# Patient Record
Sex: Female | Born: 1935 | Race: White | Hispanic: No | Marital: Married | Smoking: Never smoker
Health system: Southern US, Community
[De-identification: ages and names within clinical notes are randomized; demographics above are authoritative.]

## PROBLEM LIST (undated history)

## (undated) DIAGNOSIS — T884XXA Failed or difficult intubation, initial encounter: Secondary | ICD-10-CM

## (undated) DIAGNOSIS — T8859XA Other complications of anesthesia, initial encounter: Secondary | ICD-10-CM

## (undated) DIAGNOSIS — M1990 Unspecified osteoarthritis, unspecified site: Secondary | ICD-10-CM

## (undated) DIAGNOSIS — E119 Type 2 diabetes mellitus without complications: Secondary | ICD-10-CM

## (undated) DIAGNOSIS — T4145XA Adverse effect of unspecified anesthetic, initial encounter: Secondary | ICD-10-CM

## (undated) DIAGNOSIS — E079 Disorder of thyroid, unspecified: Secondary | ICD-10-CM

## (undated) DIAGNOSIS — N390 Urinary tract infection, site not specified: Secondary | ICD-10-CM

## (undated) HISTORY — PX: ACHILLES TENDON SURGERY: SHX542

## (undated) HISTORY — PX: OTHER SURGICAL HISTORY: SHX169

## (undated) HISTORY — PX: SHOULDER SURGERY: SHX246

## (undated) HISTORY — PX: FOOT SURGERY: SHX648

## (undated) HISTORY — PX: HEMORROIDECTOMY: SUR656

## (undated) HISTORY — PX: ABDOMINAL HYSTERECTOMY: SHX81

## (undated) HISTORY — PX: MEDIAL PARTIAL KNEE REPLACEMENT: SHX5965

## (undated) HISTORY — PX: THYROIDECTOMY, PARTIAL: SHX18

---

## 2010-01-17 ENCOUNTER — Emergency Department (HOSPITAL_BASED_OUTPATIENT_CLINIC_OR_DEPARTMENT_OTHER): Admission: EM | Admit: 2010-01-17 | Discharge: 2010-01-17 | Payer: Self-pay | Admitting: Emergency Medicine

## 2010-01-17 ENCOUNTER — Ambulatory Visit: Payer: Self-pay | Admitting: Diagnostic Radiology

## 2010-01-24 ENCOUNTER — Emergency Department (HOSPITAL_BASED_OUTPATIENT_CLINIC_OR_DEPARTMENT_OTHER): Admission: EM | Admit: 2010-01-24 | Discharge: 2010-01-24 | Payer: Self-pay | Admitting: Emergency Medicine

## 2010-01-28 ENCOUNTER — Ambulatory Visit: Payer: Self-pay | Admitting: Radiology

## 2010-01-28 ENCOUNTER — Emergency Department (HOSPITAL_BASED_OUTPATIENT_CLINIC_OR_DEPARTMENT_OTHER): Admission: EM | Admit: 2010-01-28 | Discharge: 2010-01-28 | Payer: Self-pay | Admitting: Emergency Medicine

## 2010-11-24 LAB — URINALYSIS, ROUTINE W REFLEX MICROSCOPIC
Bilirubin Urine: NEGATIVE
Glucose, UA: NEGATIVE mg/dL
Hgb urine dipstick: NEGATIVE
Ketones, ur: NEGATIVE mg/dL
Nitrite: NEGATIVE
Nitrite: NEGATIVE
Protein, ur: NEGATIVE mg/dL
Protein, ur: NEGATIVE mg/dL
Specific Gravity, Urine: 1.017 (ref 1.005–1.030)
Urobilinogen, UA: 0.2 mg/dL (ref 0.0–1.0)
Urobilinogen, UA: 0.2 mg/dL (ref 0.0–1.0)
pH: 5 (ref 5.0–8.0)

## 2010-11-24 LAB — COMPREHENSIVE METABOLIC PANEL
ALT: 60 U/L — ABNORMAL HIGH (ref 0–35)
AST: 110 U/L — ABNORMAL HIGH (ref 0–37)
AST: 78 U/L — ABNORMAL HIGH (ref 0–37)
Alkaline Phosphatase: 139 U/L — ABNORMAL HIGH (ref 39–117)
BUN: 42 mg/dL — ABNORMAL HIGH (ref 6–23)
Calcium: 9.2 mg/dL (ref 8.4–10.5)
Calcium: 9.3 mg/dL (ref 8.4–10.5)
Creatinine, Ser: 1.2 mg/dL (ref 0.4–1.2)
Glucose, Bld: 124 mg/dL — ABNORMAL HIGH (ref 70–99)
Glucose, Bld: 86 mg/dL (ref 70–99)
Potassium: 4.8 mEq/L (ref 3.5–5.1)
Sodium: 144 mEq/L (ref 135–145)
Total Bilirubin: 0.5 mg/dL (ref 0.3–1.2)
Total Protein: 7.4 g/dL (ref 6.0–8.3)
Total Protein: 7.7 g/dL (ref 6.0–8.3)

## 2010-11-24 LAB — CBC
HCT: 39.3 % (ref 36.0–46.0)
HCT: 40.1 % (ref 36.0–46.0)
MCV: 91.8 fL (ref 78.0–100.0)
MCV: 92 fL (ref 78.0–100.0)
Platelets: 130 10*3/uL — ABNORMAL LOW (ref 150–400)
Platelets: 169 10*3/uL (ref 150–400)
RDW: 14.7 % (ref 11.5–15.5)

## 2010-11-24 LAB — DIFFERENTIAL
Basophils Absolute: 0.1 10*3/uL (ref 0.0–0.1)
Basophils Relative: 1 % (ref 0–1)
Basophils Relative: 1 % (ref 0–1)
Lymphocytes Relative: 15 % (ref 12–46)
Lymphocytes Relative: 27 % (ref 12–46)
Lymphs Abs: 0.7 10*3/uL (ref 0.7–4.0)
Lymphs Abs: 1.4 10*3/uL (ref 0.7–4.0)
Neutrophils Relative %: 53 % (ref 43–77)

## 2010-11-24 LAB — URINE MICROSCOPIC-ADD ON

## 2010-11-24 LAB — LIPASE, BLOOD: Lipase: 89 U/L (ref 23–300)

## 2010-11-25 LAB — URINALYSIS, ROUTINE W REFLEX MICROSCOPIC
Bilirubin Urine: NEGATIVE
Nitrite: NEGATIVE
Specific Gravity, Urine: 1.012 (ref 1.005–1.030)
pH: 6 (ref 5.0–8.0)

## 2010-11-25 LAB — BASIC METABOLIC PANEL
BUN: 38 mg/dL — ABNORMAL HIGH (ref 6–23)
Calcium: 9.7 mg/dL (ref 8.4–10.5)
Chloride: 104 mEq/L (ref 96–112)
Potassium: 5.2 mEq/L — ABNORMAL HIGH (ref 3.5–5.1)

## 2010-11-25 LAB — DIFFERENTIAL
Basophils Absolute: 0.1 10*3/uL (ref 0.0–0.1)
Basophils Relative: 1 % (ref 0–1)
Eosinophils Absolute: 0 10*3/uL (ref 0.0–0.7)
Eosinophils Relative: 0 % (ref 0–5)
Lymphocytes Relative: 15 % (ref 12–46)
Monocytes Absolute: 0.9 10*3/uL (ref 0.1–1.0)
Neutrophils Relative %: 76 % (ref 43–77)

## 2010-11-25 LAB — CBC
HCT: 38.5 % (ref 36.0–46.0)
Hemoglobin: 12.8 g/dL (ref 12.0–15.0)
Platelets: 294 10*3/uL (ref 150–400)
RBC: 4.19 MIL/uL (ref 3.87–5.11)
RDW: 15.2 % (ref 11.5–15.5)
WBC: 11.3 10*3/uL — ABNORMAL HIGH (ref 4.0–10.5)

## 2013-09-12 ENCOUNTER — Encounter (HOSPITAL_COMMUNITY): Payer: Self-pay | Admitting: Emergency Medicine

## 2013-09-12 ENCOUNTER — Inpatient Hospital Stay (HOSPITAL_COMMUNITY)
Admission: EM | Admit: 2013-09-12 | Discharge: 2013-09-13 | DRG: 812 | Disposition: A | Discharge status: Nursing Home (Includes ICF) | Payer: Medicare Other | Attending: Family Medicine | Admitting: Family Medicine

## 2013-09-12 DIAGNOSIS — F3289 Other specified depressive episodes: Secondary | ICD-10-CM | POA: Diagnosis present

## 2013-09-12 DIAGNOSIS — K219 Gastro-esophageal reflux disease without esophagitis: Secondary | ICD-10-CM

## 2013-09-12 DIAGNOSIS — I10 Essential (primary) hypertension: Secondary | ICD-10-CM | POA: Diagnosis present

## 2013-09-12 DIAGNOSIS — D509 Iron deficiency anemia, unspecified: Principal | ICD-10-CM | POA: Diagnosis present

## 2013-09-12 DIAGNOSIS — R5381 Other malaise: Secondary | ICD-10-CM

## 2013-09-12 DIAGNOSIS — E119 Type 2 diabetes mellitus without complications: Secondary | ICD-10-CM | POA: Diagnosis present

## 2013-09-12 DIAGNOSIS — R5383 Other fatigue: Secondary | ICD-10-CM

## 2013-09-12 DIAGNOSIS — E039 Hypothyroidism, unspecified: Secondary | ICD-10-CM | POA: Diagnosis present

## 2013-09-12 DIAGNOSIS — Z96659 Presence of unspecified artificial knee joint: Secondary | ICD-10-CM

## 2013-09-12 DIAGNOSIS — M109 Gout, unspecified: Secondary | ICD-10-CM | POA: Diagnosis present

## 2013-09-12 DIAGNOSIS — M129 Arthropathy, unspecified: Secondary | ICD-10-CM | POA: Diagnosis present

## 2013-09-12 DIAGNOSIS — Z888 Allergy status to other drugs, medicaments and biological substances status: Secondary | ICD-10-CM

## 2013-09-12 DIAGNOSIS — Z9109 Other allergy status, other than to drugs and biological substances: Secondary | ICD-10-CM

## 2013-09-12 DIAGNOSIS — Z66 Do not resuscitate: Secondary | ICD-10-CM | POA: Diagnosis present

## 2013-09-12 DIAGNOSIS — D649 Anemia, unspecified: Secondary | ICD-10-CM | POA: Diagnosis present

## 2013-09-12 DIAGNOSIS — F329 Major depressive disorder, single episode, unspecified: Secondary | ICD-10-CM

## 2013-09-12 DIAGNOSIS — Z794 Long term (current) use of insulin: Secondary | ICD-10-CM

## 2013-09-12 DIAGNOSIS — Z79899 Other long term (current) drug therapy: Secondary | ICD-10-CM

## 2013-09-12 DIAGNOSIS — Z880 Allergy status to penicillin: Secondary | ICD-10-CM

## 2013-09-12 DIAGNOSIS — F32A Depression, unspecified: Secondary | ICD-10-CM

## 2013-09-12 DIAGNOSIS — R531 Weakness: Secondary | ICD-10-CM

## 2013-09-12 HISTORY — DX: Other complications of anesthesia, initial encounter: T88.59XA

## 2013-09-12 HISTORY — DX: Disorder of thyroid, unspecified: E07.9

## 2013-09-12 HISTORY — DX: Adverse effect of unspecified anesthetic, initial encounter: T41.45XA

## 2013-09-12 HISTORY — DX: Type 2 diabetes mellitus without complications: E11.9

## 2013-09-12 HISTORY — DX: Failed or difficult intubation, initial encounter: T88.4XXA

## 2013-09-12 HISTORY — DX: Unspecified osteoarthritis, unspecified site: M19.90

## 2013-09-12 LAB — CBC WITH DIFFERENTIAL/PLATELET
BASOS ABS: 0 10*3/uL (ref 0.0–0.1)
BASOS PCT: 0 % (ref 0–1)
EOS PCT: 4 % (ref 0–5)
Eosinophils Absolute: 0.2 10*3/uL (ref 0.0–0.7)
HEMATOCRIT: 15.8 % — AB (ref 36.0–46.0)
Hemoglobin: 4.8 g/dL — CL (ref 12.0–15.0)
LYMPHS PCT: 28 % (ref 12–46)
Lymphs Abs: 1.6 10*3/uL (ref 0.7–4.0)
MCH: 32 pg (ref 26.0–34.0)
MCHC: 30.4 g/dL (ref 30.0–36.0)
MCV: 105.3 fL — AB (ref 78.0–100.0)
Monocytes Absolute: 0.6 10*3/uL (ref 0.1–1.0)
Monocytes Relative: 11 % (ref 3–12)
Neutro Abs: 3.3 10*3/uL (ref 1.7–7.7)
Neutrophils Relative %: 57 % (ref 43–77)
Platelets: 225 10*3/uL (ref 150–400)
RBC: 1.5 MIL/uL — ABNORMAL LOW (ref 3.87–5.11)
RDW: 19.8 % — ABNORMAL HIGH (ref 11.5–15.5)
WBC: 5.8 10*3/uL (ref 4.0–10.5)

## 2013-09-12 LAB — GLUCOSE, CAPILLARY: Glucose-Capillary: 99 mg/dL (ref 70–99)

## 2013-09-12 LAB — POCT I-STAT, CHEM 8
BUN: 19 mg/dL (ref 6–23)
Calcium, Ion: 1.16 mmol/L (ref 1.13–1.30)
Chloride: 102 mEq/L (ref 96–112)
Creatinine, Ser: 1.1 mg/dL (ref 0.50–1.10)
Glucose, Bld: 140 mg/dL — ABNORMAL HIGH (ref 70–99)
HCT: 17 % — ABNORMAL LOW (ref 36.0–46.0)
HEMOGLOBIN: 5.8 g/dL — AB (ref 12.0–15.0)
POTASSIUM: 4.1 meq/L (ref 3.7–5.3)
SODIUM: 140 meq/L (ref 137–147)
TCO2: 25 mmol/L (ref 0–100)

## 2013-09-12 LAB — COMPREHENSIVE METABOLIC PANEL
ALT: 20 U/L (ref 0–35)
AST: 28 U/L (ref 0–37)
Albumin: 2.5 g/dL — ABNORMAL LOW (ref 3.5–5.2)
Alkaline Phosphatase: 82 U/L (ref 39–117)
BUN: 19 mg/dL (ref 6–23)
CO2: 25 mEq/L (ref 19–32)
CREATININE: 0.95 mg/dL (ref 0.50–1.10)
Calcium: 8.4 mg/dL (ref 8.4–10.5)
Chloride: 102 mEq/L (ref 96–112)
GFR calc non Af Amer: 53 mL/min — ABNORMAL LOW (ref >90)
GFR, EST AFRICAN AMERICAN: 61 mL/min — AB (ref >90)
Glucose, Bld: 143 mg/dL — ABNORMAL HIGH (ref 70–99)
Potassium: 4.2 mEq/L (ref 3.7–5.3)
SODIUM: 141 meq/L (ref 137–147)
TOTAL PROTEIN: 5.4 g/dL — AB (ref 6.0–8.3)
Total Bilirubin: <0.2 mg/dL — ABNORMAL LOW (ref 0.3–1.2)

## 2013-09-12 LAB — RETICULOCYTES
RBC.: 1.48 MIL/uL — ABNORMAL LOW (ref 3.87–5.11)
RETIC CT PCT: 13.7 % — AB (ref 0.4–3.1)
Retic Count, Absolute: 202.8 10*3/uL — ABNORMAL HIGH (ref 19.0–186.0)

## 2013-09-12 LAB — POCT I-STAT TROPONIN I: Troponin i, poc: 0.02 ng/mL (ref 0.00–0.08)

## 2013-09-12 LAB — PREPARE RBC (CROSSMATCH)

## 2013-09-12 LAB — PROTIME-INR
INR: 0.89 (ref 0.00–1.49)
PROTHROMBIN TIME: 11.9 s (ref 11.6–15.2)

## 2013-09-12 LAB — OCCULT BLOOD, POC DEVICE: Fecal Occult Bld: NEGATIVE

## 2013-09-12 LAB — ABO/RH: ABO/RH(D): O POS

## 2013-09-12 MED ORDER — ALLOPURINOL 100 MG PO TABS
100.0000 mg | ORAL_TABLET | Freq: Two times a day (BID) | ORAL | Status: DC
  Administered 2013-09-12 – 2013-09-13 (×2): 100 mg via ORAL
  Filled 2013-09-12 (×3): qty 1

## 2013-09-12 MED ORDER — SODIUM CHLORIDE 0.9 % IJ SOLN: 3.0000 mL | Freq: Two times a day (BID) | INTRAMUSCULAR | Status: DC

## 2013-09-12 MED ORDER — DIVALPROEX SODIUM ER 500 MG PO TB24
500.0000 mg | ORAL_TABLET | Freq: Every day | ORAL | Status: DC
  Administered 2013-09-12: 22:00:00 500 mg via ORAL
  Filled 2013-09-12 (×2): qty 1

## 2013-09-12 MED ORDER — LATANOPROST 0.005 % OP SOLN
1.0000 [drp] | Freq: Every day | OPHTHALMIC | Status: DC
  Administered 2013-09-12: 22:00:00 1 [drp] via OPHTHALMIC
  Filled 2013-09-12: qty 2.5

## 2013-09-12 MED ORDER — PANTOPRAZOLE SODIUM 40 MG PO TBEC
40.0000 mg | DELAYED_RELEASE_TABLET | Freq: Every day | ORAL | Status: DC
  Administered 2013-09-13: 40 mg via ORAL
  Filled 2013-09-12: qty 1

## 2013-09-12 MED ORDER — MECLIZINE HCL 25 MG PO TABS
25.0000 mg | ORAL_TABLET | Freq: Every day | ORAL | Status: DC
  Administered 2013-09-13: 25 mg via ORAL
  Filled 2013-09-12: qty 1

## 2013-09-12 MED ORDER — FUROSEMIDE 10 MG/ML IJ SOLN
20.0000 mg | Freq: Once | INTRAMUSCULAR | Status: AC
  Administered 2013-09-12: 20 mg via INTRAVENOUS
  Filled 2013-09-12: qty 2

## 2013-09-12 MED ORDER — SODIUM CHLORIDE 0.9 % IJ SOLN: 3.0000 mL | INTRAMUSCULAR | Status: DC | PRN

## 2013-09-12 MED ORDER — ONDANSETRON HCL 4 MG/2ML IJ SOLN: 4.0000 mg | Freq: Three times a day (TID) | INTRAMUSCULAR | Status: AC | PRN

## 2013-09-12 MED ORDER — HYDROCORTISONE ACETATE 25 MG RE SUPP
25.0000 mg | Freq: Two times a day (BID) | RECTAL | Status: DC
  Administered 2013-09-12: 22:00:00 25 mg via RECTAL
  Filled 2013-09-12 (×3): qty 1

## 2013-09-12 MED ORDER — FERROUS SULFATE 325 (65 FE) MG PO TABS
325.0000 mg | ORAL_TABLET | Freq: Three times a day (TID) | ORAL | Status: DC
  Administered 2013-09-12 – 2013-09-13 (×3): 325 mg via ORAL
  Filled 2013-09-12 (×5): qty 1

## 2013-09-12 MED ORDER — INSULIN ASPART 100 UNIT/ML ~~LOC~~ SOLN: 0.0000 [IU] | Freq: Three times a day (TID) | SUBCUTANEOUS | Status: DC

## 2013-09-12 MED ORDER — SODIUM CHLORIDE 0.9 % IV BOLUS (SEPSIS)
1000.0000 mL | Freq: Once | INTRAVENOUS | Status: AC
  Administered 2013-09-12: 1000 mL via INTRAVENOUS

## 2013-09-12 MED ORDER — LEVOTHYROXINE SODIUM 75 MCG PO TABS
75.0000 ug | ORAL_TABLET | Freq: Every day | ORAL | Status: DC
  Administered 2013-09-13: 75 ug via ORAL
  Filled 2013-09-12 (×2): qty 1

## 2013-09-12 MED ORDER — VENLAFAXINE HCL ER 150 MG PO CP24
150.0000 mg | ORAL_CAPSULE | Freq: Every day | ORAL | Status: DC
  Administered 2013-09-13: 09:00:00 150 mg via ORAL
  Filled 2013-09-12: qty 1

## 2013-09-12 MED ORDER — SODIUM CHLORIDE 0.9 % IV SOLN
INTRAVENOUS | Status: AC
Start: 2013-09-12 — End: 2013-09-13
  Administered 2013-09-12: 23:00:00 via INTRAVENOUS

## 2013-09-12 MED ORDER — OXYCODONE-ACETAMINOPHEN 5-325 MG PO TABS
1.0000 | ORAL_TABLET | Freq: Two times a day (BID) | ORAL | Status: DC | PRN
  Administered 2013-09-12 – 2013-09-13 (×2): 1 via ORAL
  Filled 2013-09-12 (×2): qty 1

## 2013-09-12 MED ORDER — SODIUM CHLORIDE 0.9 % IJ SOLN
3.0000 mL | Freq: Two times a day (BID) | INTRAMUSCULAR | Status: DC
  Administered 2013-09-12 – 2013-09-13 (×2): 3 mL via INTRAVENOUS

## 2013-09-12 MED ORDER — SODIUM CHLORIDE 0.9 % IV SOLN: 250.0000 mL | INTRAVENOUS | Status: DC | PRN

## 2013-09-12 NOTE — ED Notes (Addendum)
Per EMS, [facility], c/o weakness, headache, and abnormal lab value x "a couple days."  Facility sts Hgb 4.7.  Vitals are stable.  Pt reports rectal bleeding, but sts "it has pretty much stopped."

## 2013-09-12 NOTE — ED Provider Notes (Signed)
CSN: [id number]     Arrival date & time 09/12/13  1430 History   First MD Initiated Contact with Patient 09/12/13 1500     Chief Complaint  Patient presents with  . Weakness  . Abnormal Lab value    (Consider location/radiation/quality/duration/timing/severity/associated sxs/prior Treatment) HPI  80 year old female BIB EMS from [facility] nursing facility for evaluation of generalized weakness and anemia.  History obtained through pt who is a good historian. Pt sts she felt tired/fatigue ongoing for nearly a week.  Onset is gradual, report lightheadedness, especially with exertion and with movement.  Feels like she has to sleep more than usual.  She also report mild frontal headache, body aches and leg pain.  Pain to leg is achy R>L.  Her facility noted that her Hgb is 4.7 and sent her here for evaluation.  Husband noted that pt has anemia and required 1 pint of blood transfusion within the past 6 months, unsure cause of anemia.  Otherwise pt denies fever, chills, cp, sob, productive cough, hemoptysis, hematemesis, hematochezia or melena. Denies abd pain, back pain, dysuria or rash.  Does notice some bright blood when wipes after a BM several days ago but that has stopped.  sts she has hx of hemorrhoids and that's not unusual.  Denies any NSAIDs use, denies taking blood thinner medication. No other complaint.    No past medical history on file. No past surgical history on file. No family history on file. History  Substance Use Topics  . Smoking status: Not on file  . Smokeless tobacco: Not on file  . Alcohol Use: Not on file   OB History   No data available     Review of Systems  All other systems reviewed and are negative.    Allergies  Gabapentin; Lorazepam; Metoclopramide; Penicillins; Tape; and Teflaro  Home Medications   Current Outpatient Rx  Name  Route  Sig  Dispense  Refill  . allopurinol (ZYLOPRIM) 100 MG tablet   Oral   Take 100 mg by mouth 2 (two) times  daily.         . baclofen (LIORESAL) 10 MG tablet   Oral   Take 10 mg by mouth at bedtime.         . beta carotene w/minerals (OCUVITE) tablet   Oral   Take 1 tablet by mouth 2 (two) times daily.         . calcium-vitamin D (OSCAL WITH D) 500-200 MG-UNIT per tablet   Oral   Take 1 tablet by mouth.         . diltiazem (DILACOR XR) 180 MG 24 hr capsule   Oral   Take 180 mg by mouth daily.         . divalproex (DEPAKOTE ER) 250 MG 24 hr tablet   Oral   Take 500 mg by mouth at bedtime. Take two capsules at bedtime         . fentaNYL (DURAGESIC - DOSED MCG/HR) 25 MCG/HR patch   Transdermal   Place 25 mcg onto the skin every 3 (three) days.         . ferrous sulfate 325 (65 FE) MG tablet   Oral   Take 325 mg by mouth daily with breakfast.         . hydrALAZINE (APRESOLINE) 25 MG tablet   Oral   Take 25 mg by mouth 2 (two) times daily.         . hydrocortisone (ANUSOL-HC) 25  MG suppository   Rectal   Place 25 mg rectally 2 (two) times daily.         . insulin glargine (LANTUS) 100 UNIT/ML injection   Subcutaneous   Inject 8 Units into the skin at bedtime.         Marland Kitchen. latanoprost (XALATAN) 0.005 % ophthalmic solution   Both Eyes   Place 1 drop into both eyes at bedtime.         Marland Kitchen. levothyroxine (SYNTHROID, LEVOTHROID) 75 MCG tablet   Oral   Take 75 mcg by mouth daily before breakfast.         . meclizine (ANTIVERT) 25 MG tablet   Oral   Take 25 mg by mouth every morning.         Marland Kitchen. omeprazole (PRILOSEC) 20 MG capsule   Oral   Take 20 mg by mouth daily.         Marland Kitchen. oxyCODONE-acetaminophen (PERCOCET/ROXICET) 5-325 MG per tablet   Oral   Take 1 tablet by mouth every 12 (twelve) hours as needed for severe pain (pain).         Marland Kitchen. venlafaxine XR (EFFEXOR-XR) 150 MG 24 hr capsule   Oral   Take 150 mg by mouth daily with breakfast.         . Vitamin D, Ergocalciferol, (DRISDOL) 50000 UNITS CAPS capsule   Oral   Take 50,000 Units by mouth  every 7 (seven) days.          SpO2 99% Physical Exam  Nursing note and vitals reviewed. Constitutional: She is oriented to person, place, and time.  Awake, alert, nontoxic appearance  HENT:  Head: Atraumatic.  Oral mucosa pale  Eyes: Conjunctivae are normal. Pupils are equal, round, and reactive to light. Right eye exhibits no discharge. Left eye exhibits no discharge.  Conjunctiva pale  Neck: Neck supple. No JVD present.  Cardiovascular: Regular rhythm.   Pulmonary/Chest: Effort normal. She has no wheezes. She exhibits no tenderness.  Abdominal: She exhibits no mass. There is no tenderness (mild LLQ tenderness without guarding or rebound tenderness). There is no rebound and no guarding.  Genitourinary:  Chaperone present:  External hemorrhoid noted, speck of BRBPR noted on digital exam without melena, no mass, normal rectal tone.  Musculoskeletal: She exhibits no tenderness.  Baseline ROM, no obvious new focal weakness  Neurological: She is alert and oriented to person, place, and time.  Mental status and motor strength appears baseline for patient and situation  Skin: No rash noted. There is pallor.  Psychiatric: She has a normal mood and affect.    ED Course  Procedures (including critical care time)  3:23 PM Pt with generalized weakness, and reported anemia with Hgb 4.7 per facility.  She is without acute pain, is mentating appropriately and currently hemodynamically stable.  However, pt is pale.  Work up initiated.  WIll need admission for transfusion.    Pt has mild LLQ tenderness, possible diverticulosis/diverticulitis?  4:53 PM Today's hemoglobin is 4.8. Evidence of macrocytic anemia, no evidence of pancytopenia. Doubt GI bleed as pt's Hemoccult is negative and normal BUN. Will start blood transfusion in ER and will consult for admission for further management. Pt has been reassessed, is hemodynamically stable at this time, mentating appropriately.  Will start  transfusing 2 units of PRBC over 4 hrs period.    5:27 PM i have consulted with [facility], Dr. Cena BentonVega who agrees to admit pt to telemetry bed, Team 8, under his care.  CRITICAL CARE Performed by: Fayrene Helper Total critical care time: 30 min Critical care time was exclusive of separately billable procedures and treating other patients. Critical care was necessary to treat or prevent imminent or life-threatening deterioration. Critical care was time spent personally by me on the following activities: development of treatment plan with patient and/or surrogate as well as nursing, discussions with consultants, evaluation of patient's response to treatment, examination of patient, obtaining history from patient or surrogate, ordering and performing treatments and interventions, ordering and review of laboratory studies, ordering and review of radiographic studies, pulse oximetry and re-evaluation of patient's condition.   Labs Review Labs Reviewed  CBC WITH DIFFERENTIAL - Abnormal; Notable for the following:    RBC 1.50 (*)    Hemoglobin 4.8 (*)    HCT 15.8 (*)    MCV 105.3 (*)    RDW 19.8 (*)    All other components within normal limits  COMPREHENSIVE METABOLIC PANEL - Abnormal; Notable for the following:    Glucose, Bld 143 (*)    Total Protein 5.4 (*)    Albumin 2.5 (*)    Total Bilirubin <0.2 (*)    GFR calc non Af Amer 53 (*)    GFR calc Af Amer 61 (*)    All other components within normal limits  POCT I-STAT, CHEM 8 - Abnormal; Notable for the following:    Glucose, Bld 140 (*)    Hemoglobin 5.8 (*)    HCT 17.0 (*)    All other components within normal limits  PROTIME-INR  VITAMIN B12  FOLATE  IRON AND TIBC  FERRITIN  RETICULOCYTES  OCCULT BLOOD, POC DEVICE  POCT I-STAT TROPONIN I  TYPE AND SCREEN  ABO/RH  PREPARE RBC (CROSSMATCH)   Imaging Review No results found.  EKG Interpretation   None       MDM   1. Anemia    BP 138/58  Pulse 88  Temp(Src)  98.5 F (36.9 C) (Oral)  Resp 14  SpO2 98%  I have reviewed nursing notes and vital signs. I personally reviewed the imaging tests through PACS system  I reviewed available ER/hospitalization records thought the EMR      Fayrene Helper, [place] 09/12/13 1736

## 2013-09-12 NOTE — Progress Notes (Signed)
   CARE MANAGEMENT ED NOTE 09/12/2013  Patient:  Kaitlyn Burns,Kaitlyn Burns   Account Number:  [id number]  Date Initiated:  09/12/2013  Documentation initiated by:  Kaitlyn Burns,Kaitlyn Burns  Subjective/Objective Assessment:   80 yr old female medicare/tricare  pt from [facility] pcp not listed in [place] but listed on facility information as Kaitlyn Burns     Subjective/Objective Assessment Detail:     Action/Plan:   Action/Plan Detail:   EPIC updated   Anticipated DC Date:  09/15/2013     Status Recommendation to Physician:   Result of Recommendation:    Other ED Services  Consult Working Plan    DC Planning Services  Other  PCP issues  Outpatient Services - Pt will follow up    Choice offered to / List presented to:            Status of service:  Completed, signed off  ED Comments:   ED Comments Detail:

## 2013-09-12 NOTE — ED Provider Notes (Signed)
Medical screening examination/treatment/procedure(s) were conducted as a shared visit with non-physician practitioner(s) and myself.  I personally evaluated the patient during the encounter.  EKG Interpretation    Date/Time:    Ventricular Rate:    PR Interval:    QRS Duration:   QT Interval:    QTC Calculation:   R Axis:     Text Interpretation:               CRITICAL CARE Performed by: Dagmar HaitWALDEN, WILLIAM Michaila Kenney   Total critical care time: 30 minutes  Critical care time was exclusive of separately billable procedures and treating other patients.  Critical care was necessary to treat or prevent imminent or life-threatening deterioration.  Critical care was time spent personally by me on the following activities: development of treatment plan with patient and/or surrogate as well as nursing, discussions with consultants, evaluation of patient's response to treatment, examination of patient, obtaining history from patient or surrogate, ordering and performing treatments and interventions, ordering and review of laboratory studies, ordering and review of radiographic studies, pulse oximetry and re-evaluation of patient's condition.   80 year old female coming from a nursing home with low hemoglobin. Hemoglobin 4.7 there. Here no melena. Hemoglobin 5.8. Patient relaxing comfortably. Vitals stable. Admitted to medicine. Patient had blood transfusion initiated in the ER  Dagmar HaitWilliam Falon Huesca, MD 09/12/13 2324

## 2013-09-12 NOTE — ED Notes (Signed)
PA at bedside.

## 2013-09-12 NOTE — H&P (Signed)
[facility] History and Physical  Kaitlyn GheeRobye Mucci GNF:[id number] DOB: 12/25/1924 DOA: 09/12/2013  Referring physician: discussed with Fayrene HelperBowie Tran PA PCP: Brooke BonitoGALLEMORE,WARREN, MD   Chief Complaint: Weakness  HPI: Kaitlyn GheeRobye Burns is a 80 y.o. female  The presented to the ED complaining of weakness. That she had noticed blood with bowel movements and on her underwear for the last week but the bleeding resolved on its own. Patient became weaker and for the last week her condition got worse. Given persistence and worsening in symptoms patient decided to come to the ED for further evaluation. Patient states that with movement she noted she would be short of breath and this progressed and got worse as the week went on.  While in the ED patient was found to be anemic with a hemoglobin level of 5.8. Patient was subsequently typed and crossed and we were consult at for further evaluation and recommendations 4 anemia. Fecal occult blood negative   Review of Systems:  Constitutional:  No weight loss, night sweats, Fevers, chills, fatigue.  HEENT:  No headaches, Difficulty swallowing,Tooth/dental problems,Sore throat,  No sneezing, itching, ear ache, nasal congestion, post nasal drip,  Cardio-vascular:  No chest pain, Orthopnea, PND, swelling in lower extremities, anasarca, dizziness, palpitations  GI:  No heartburn, indigestion, abdominal pain, nausea, vomiting, diarrhea, change in bowel habits, loss of appetite  Resp:  + shortness of breath with exertion. No excess mucus, no productive cough, No non-productive cough, No coughing up of blood.No change in color of mucus.No wheezing.No chest wall deformity  Skin:  no rash or lesions.  GU:  no dysuria, change in color of urine, no urgency or frequency. No flank pain.  Musculoskeletal:  No joint pain or swelling. No decreased range of motion. No back pain.  Psych:  No change in mood or affect. No depression or anxiety. No memory loss.   Past Medical  History  Diagnosis Date  . Diabetes mellitus   . Thyroid disease   . Complication of anesthesia     trouble intubating surgery  . Difficult intubation   . Arthritis    Past Surgical History  Procedure Laterality Date  . Abdominal hysterectomy    . Hemorroidectomy    . Medial partial knee replacement    . Thyroidectomy, partial    . Shoulder surgery Right     "had shredded a muscle"  . Achilles tendon surgery Left   . Foot surgery Left     left heel infection that went to bone per spouse  . Lower abdominel surgery     Social History:  reports that she has never smoked. She has never used smokeless tobacco. She reports that she drinks alcohol. She reports that she does not use illicit drugs.  Allergies  Allergen Reactions  . Gabapentin     unknown  . Lorazepam     unknown  . Metoclopramide     unknown  . Penicillins     unknown  . Tape     unknown  . Teflaro [Ceftaroline]     unknown    History reviewed. No pertinent family history.   Prior to Admission medications   Medication Sig Start Date End Date Taking? Authorizing Provider  allopurinol (ZYLOPRIM) 100 MG tablet Take 100 mg by mouth 2 (two) times daily.   Yes Historical Provider, MD  baclofen (LIORESAL) 10 MG tablet Take 10 mg by mouth at bedtime.   Yes Historical Provider, MD  beta carotene w/minerals (OCUVITE) tablet Take 1 tablet by mouth  2 (two) times daily.   Yes Historical Provider, MD  calcium-vitamin D (OSCAL WITH D) 500-200 MG-UNIT per tablet Take 1 tablet by mouth.   Yes Historical Provider, MD  diltiazem (DILACOR XR) 180 MG 24 hr capsule Take 180 mg by mouth daily.   Yes Historical Provider, MD  divalproex (DEPAKOTE ER) 250 MG 24 hr tablet Take 500 mg by mouth at bedtime. Take two capsules at bedtime   Yes Historical Provider, MD  fentaNYL (DURAGESIC - DOSED MCG/HR) 25 MCG/HR patch Place 25 mcg onto the skin every 3 (three) days.   Yes Historical Provider, MD  ferrous sulfate 325 (65 FE) MG tablet Take  325 mg by mouth daily with breakfast.   Yes Historical Provider, MD  hydrALAZINE (APRESOLINE) 25 MG tablet Take 25 mg by mouth 2 (two) times daily.   Yes Historical Provider, MD  hydrocortisone (ANUSOL-HC) 25 MG suppository Place 25 mg rectally 2 (two) times daily. 09/09/13  Yes Historical Provider, MD  insulin glargine (LANTUS) 100 UNIT/ML injection Inject 8 Units into the skin at bedtime.   Yes Historical Provider, MD  latanoprost (XALATAN) 0.005 % ophthalmic solution Place 1 drop into both eyes at bedtime.   Yes Historical Provider, MD  levothyroxine (SYNTHROID, LEVOTHROID) 75 MCG tablet Take 75 mcg by mouth daily before breakfast.   Yes Historical Provider, MD  meclizine (ANTIVERT) 25 MG tablet Take 25 mg by mouth every morning.   Yes Historical Provider, MD  omeprazole (PRILOSEC) 20 MG capsule Take 20 mg by mouth daily.   Yes Historical Provider, MD  oxyCODONE-acetaminophen (PERCOCET/ROXICET) 5-325 MG per tablet Take 1 tablet by mouth every 12 (twelve) hours as needed for severe pain (pain).   Yes Historical Provider, MD  venlafaxine XR (EFFEXOR-XR) 150 MG 24 hr capsule Take 150 mg by mouth daily with breakfast.   Yes Historical Provider, MD  Vitamin D, Ergocalciferol, (DRISDOL) 50000 UNITS CAPS capsule Take 50,000 Units by mouth every 7 (seven) days.   Yes Historical Provider, MD   Physical Exam: Filed Vitals:   09/12/13 1837  BP: 120/45  Pulse: 86  Temp: 98.2 F (36.8 C)  Resp: 16    BP 120/45  Pulse 86  Temp(Src) 98.2 F (36.8 C) (Oral)  Resp 16  SpO2 93%  General:  Appears calm and comfortable Eyes: PERRL, conjunctival pallor ENT: grossly normal hearing, lips & tongue Neck: no LAD, masses or thyromegaly Cardiovascular: RRR, no m/r/g. No LE edema. Telemetry: SR, no arrhythmias  Respiratory: CTA bilaterally, no w/r/r. Normal respiratory effort. Abdomen: soft, ntnd Skin: no rash or induration seen on limited exam Musculoskeletal: grossly normal tone BUE/BLE Psychiatric:  grossly normal mood and affect, speech fluent and appropriate Neurologic: grossly non-focal.          Labs on Admission:  Basic Metabolic Panel:  Recent Labs Lab 09/12/13 1530 09/12/13 1602  NA 141 140  K 4.2 4.1  CL 102 102  CO2 25  --   GLUCOSE 143* 140*  BUN 19 19  CREATININE 0.95 1.10  CALCIUM 8.4  --    Liver Function Tests:  Recent Labs Lab 09/12/13 1530  AST 28  ALT 20  ALKPHOS 82  BILITOT <0.2*  PROT 5.4*  ALBUMIN 2.5*   No results found for this basename: LIPASE, AMYLASE,  in the last 168 hours No results found for this basename: AMMONIA,  in the last 168 hours CBC:  Recent Labs Lab 09/12/13 1530 09/12/13 1602  WBC 5.8  --   NEUTROABS 3.3  --  HGB 4.8* 5.8*  HCT 15.8* 17.0*  MCV 105.3*  --   PLT 225  --    Cardiac Enzymes: No results found for this basename: CKTOTAL, CKMB, CKMBINDEX, TROPONINI,  in the last 168 hours  BNP (last 3 results) No results found for this basename: PROBNP,  in the last 8760 hours CBG: No results found for this basename: GLUCAP,  in the last 168 hours  Radiological Exams on Admission: No results found.  EKG: Independently reviewed. Sinus rhythm with no ST elevations or depressions with artifact  Assessment/Plan Active Problems:   Anemia -We'll plan on transfusing 3 units of packed red blood cells total. Patient has been typed and crossed - Discussed with family and patient and they're agreeable - We'll plan on obtaining posttransfusion CBC - Transfuse slowly given age  Gout - Stable continue home regimen  Hypertension - Given degree of anemia will hold blood pressure medications at this point. After blood transfusion may continue blood pressure medication if blood pressure elevated next a.m.  Diabetes - Will place on diabetic diet and sliding scale insulin.  Hypothyroidism - Stable continue Synthroid  Gastroesophageal reflux disease - Stable continue PPI we'll in house  Depression - Stable continue  home regimen.   Code Status: DO NOT RESUSCITATE per my discussion with patient Family Communication: Discussed with husband and family Disposition Plan: After transfusion since there is no active bleeding to consider discharge with followup appointment to see gastroenterologist.  Time spent:   Penny Pia [facility] Pager [phone]

## 2013-09-13 LAB — IRON AND TIBC
IRON: 14 ug/dL — AB (ref 42–135)
Saturation Ratios: 4 % — ABNORMAL LOW (ref 20–55)
TIBC: 369 ug/dL (ref 250–470)
UIBC: 355 ug/dL (ref 125–400)

## 2013-09-13 LAB — HEMOGLOBIN AND HEMATOCRIT, BLOOD
HEMATOCRIT: 29.3 % — AB (ref 36.0–46.0)
HEMOGLOBIN: 9.6 g/dL — AB (ref 12.0–15.0)

## 2013-09-13 LAB — CBC
HEMATOCRIT: 25.1 % — AB (ref 36.0–46.0)
HEMOGLOBIN: 8.1 g/dL — AB (ref 12.0–15.0)
MCH: 29.8 pg (ref 26.0–34.0)
MCHC: 32.3 g/dL (ref 30.0–36.0)
MCV: 92.3 fL (ref 78.0–100.0)
Platelets: 211 10*3/uL (ref 150–400)
RBC: 2.72 MIL/uL — AB (ref 3.87–5.11)
RDW: 21.7 % — ABNORMAL HIGH (ref 11.5–15.5)
WBC: 7 10*3/uL (ref 4.0–10.5)

## 2013-09-13 LAB — BASIC METABOLIC PANEL
BUN: 14 mg/dL (ref 6–23)
CHLORIDE: 103 meq/L (ref 96–112)
CO2: 28 mEq/L (ref 19–32)
Calcium: 7.6 mg/dL — ABNORMAL LOW (ref 8.4–10.5)
Creatinine, Ser: 0.79 mg/dL (ref 0.50–1.10)
GFR calc Af Amer: 85 mL/min — ABNORMAL LOW (ref >90)
GFR calc non Af Amer: 73 mL/min — ABNORMAL LOW (ref >90)
GLUCOSE: 104 mg/dL — AB (ref 70–99)
Potassium: 3.9 mEq/L (ref 3.7–5.3)
SODIUM: 143 meq/L (ref 137–147)

## 2013-09-13 LAB — VITAMIN B12: Vitamin B-12: 1068 pg/mL — ABNORMAL HIGH (ref 211–911)

## 2013-09-13 LAB — FOLATE: Folate: 16.2 ng/mL

## 2013-09-13 LAB — GLUCOSE, CAPILLARY
GLUCOSE-CAPILLARY: 91 mg/dL (ref 70–99)
GLUCOSE-CAPILLARY: 116 mg/dL — AB (ref 70–99)

## 2013-09-13 LAB — MRSA PCR SCREENING: MRSA BY PCR: NEGATIVE

## 2013-09-13 LAB — FERRITIN: FERRITIN: 88 ng/mL (ref 10–291)

## 2013-09-13 MED ORDER — TRAMADOL HCL 50 MG PO TABS
50.0000 mg | ORAL_TABLET | Freq: Once | ORAL | Status: AC
  Administered 2013-09-13: 02:00:00 50 mg via ORAL
  Filled 2013-09-13: qty 1

## 2013-09-13 MED ORDER — MUSCLE RUB 10-15 % EX CREA
TOPICAL_CREAM | CUTANEOUS | Status: DC | PRN
  Administered 2013-09-13: 02:00:00 via TOPICAL
  Filled 2013-09-13: qty 85

## 2013-09-13 MED ORDER — FERROUS SULFATE 325 (65 FE) MG PO TABS: 325.0000 mg | ORAL_TABLET | Freq: Three times a day (TID) | ORAL | Status: DC

## 2013-09-13 NOTE — Evaluation (Signed)
Physical Therapy Evaluation Patient Details Name: Kaitlyn Burns MRN: [id number] DOB: 12/06/1924 Today's Date: 09/13/2013 Time: [id number] PT Time Calculation (min): 20 min  PT Assessment / Plan / Recommendation History of Present Illness  80 yo female admitted with anemia, weakness. Pt is from ALF with husband.   Clinical Impression  On eval, pt required Min assist for mobility-able to perform stand pivot (with and without walker) x 3 with Min guard assist. Recommend HHPT at ALF to maximize independence and safety with mobility in home environment.     PT Assessment  Patient needs continued PT services    Follow Up Recommendations  Home health PT (at ALF)    Does the patient have the potential to tolerate intense rehabilitation      Barriers to Discharge        Equipment Recommendations  None recommended by PT    Recommendations for Other Services OT consult   Frequency Min 3X/week    Precautions / Restrictions Precautions Precautions: Fall Restrictions Weight Bearing Restrictions: No   Pertinent Vitals/Pain Abdomen 4/10      Mobility  Bed Mobility: Supine to Sit: Modified Independent; HOB elevated Transfers:  Sit to/from Stand: Min assist to rise from low surface (bed), Min guard assist to rise from recliner with use armrests. Stand-pivot: x 3 (once without walker, twice with walker). Min guard assist. Increased time.  Ambulation/Gait General Gait Details: N/A-pt mobilizes at wheelchair level. Little to no ambulation    Exercises     PT Diagnosis: Generalized weakness  PT Problem List: Decreased mobility;Decreased strength PT Treatment Interventions: DME instruction;Functional mobility training;Therapeutic activities;Therapeutic exercise;Patient/family education;Wheelchair mobility training     PT Goals(Current goals can be found in the care plan section) Acute Rehab PT Goals Patient Stated Goal: return to ALF PT Goal Formulation: With patient/family Time For  Goal Achievement: 09/27/13 Potential to Achieve Goals: Good  Visit Information  Last PT Received On: 09/13/13 Assistance Needed: +1 History of Present Illness: 80 yo female admitted with anemia, weakness. Pt is from ALF with husband.        Prior Functioning  Home Living Family/patient expects to be discharged to:: Assisted living Home Equipment: Dan HumphreysWalker - 2 wheels;Wheelchair - manual Prior Function Level of Independence: Needs assistance Gait / Transfers Assistance Needed: non-ambulatory. stand pivots mostly. uses wheelchair to propel herself around.  ADL's / Homemaking Assistance Needed: get assistance for showers Communication Communication: HOH;No difficulties    Cognition  Cognition Arousal/Alertness: Awake/Burns Behavior During Therapy: WFL for tasks assessed/performed Overall Cognitive Status: Within Functional Limits for tasks assessed    Extremity/Trunk Assessment Upper Extremity Assessment Upper Extremity Assessment: Generalized weakness Lower Extremity Assessment Lower Extremity Assessment: Generalized weakness (arthritic changes bilateral knees) Cervical / Trunk Assessment Cervical / Trunk Assessment: Kyphotic   Balance    End of Session PT - End of Session Activity Tolerance: Patient tolerated treatment well Patient left: in chair;with call bell/phone within reach;with family/visitor present Nurse Communication: Mobility status  GP     Kaitlyn Burns, MPT Pager: [phone]

## 2013-09-13 NOTE — Progress Notes (Signed)
[facility]. CSW confirmed with Crystal @ ALF that patient is ok to return. Patient & husband at bedside aware. Discharge packet given to RN, Pam. Husband to transport back to ALF.   Unice BaileyKelly Foley, LCSW [facility] Clinical Social Worker cell #: [phone]

## 2013-09-13 NOTE — Discharge Summary (Signed)
Physician Discharge Summary  Rockney GheeRobye Wauneka ZOX:[id number] DOB: 04/17/1925 DOA: 09/12/2013  PCP: Brooke BonitoGALLEMORE,WARREN, MD  Admit date: 09/12/2013 Discharge date: 09/13/2013  Time spent: > 35 minutes  Recommendations for Outpatient Follow-up:  1. Please be sure to check hemoglobin levels 2. Patient will require gastroenterologist evaluation post discharge 3. Increase ferrous sulfate home regimen to 325 mg by mouth 3 times a day  Discharge Diagnoses:  Active Problems:   Anemia   Gout   Hypertension   Diabetes   Depression   GERD (gastroesophageal reflux disease)   Discharge Condition: Stable no active bleeding  Diet recommendation: Carb modified diet  Filed Weights   09/12/13 1936  Weight: 67.314 kg (148 lb 6.4 oz)    History of present illness:  80 year old Caucasian female who presented with hemoglobin level of 5.8 and negative fecal occult blood. Patient states she had colonoscopy a few years ago which was normal. She was not able to tell me how many years ago she had the colonoscopy.  Hospital Course:  Anemia  -Patient was transfused 3 units of packed red blood cells and repeat hemoglobin levels were at 9.6 - Anemia panel was done which showed low iron level of 11 with low saturations. Will increase ferrous sulfate to 325 mg by mouth 3 times a day. Most likely cause of anemia is iron deficiency anemia although patient did mention she had some bright red blood per thumb a week prior to admission of note she was fecal occult negative in the ED. And no reported bloody BM's while in house. As such will have secretary set up followup appointment with GI on discharge  Gout  - Stable continue home regimen   Hypertension  - Will continue patient's diltiazem but will hold her hydralazine on discharge  Diabetes  - Will recommend diabetic diet and continue home regimen on discharge  Hypothyroidism  - Stable continue Synthroid   Gastroesophageal reflux disease  - Stable continue PPI  while in house   Depression  - Stable continue home regimen.  Procedures:  Transfusion as mentioned above  Consultations:  None  Discharge Exam: Filed Vitals:   09/13/13 1050  BP: 131/59  Pulse: 82  Temp: 98.4 F (36.9 C)  Resp: 16    General: Patient in no acute distress, alert and awake Cardiovascular: Regular rate and rhythm, no murmurs or rubs Respiratory: No increased work of breathing, clear to auscultation bilaterally  Discharge Instructions  Discharge Orders   Future Orders Complete By Expires   Call MD for:  persistant dizziness or light-headedness  As directed    Call MD for:  severe uncontrolled pain  As directed    Call MD for:  temperature >100.4  As directed    Diet - low sodium heart healthy  As directed    Discharge instructions  As directed    Comments:     To assisted living facility with home health physical therapy. Also recommend following up with gastroenterologist we will have our secretary set up follow up appointment with you on discharge. Please be sure to call  [facility] if you have not heard from them within the next week   Increase activity slowly  As directed        Medication List    STOP taking these medications       hydrALAZINE 25 MG tablet  Commonly known as:  APRESOLINE      TAKE these medications       allopurinol 100 MG tablet  Commonly known  as:  ZYLOPRIM  Take 100 mg by mouth 2 (two) times daily.     baclofen 10 MG tablet  Commonly known as:  LIORESAL  Take 10 mg by mouth at bedtime.     beta carotene w/minerals tablet  Take 1 tablet by mouth 2 (two) times daily.     calcium-vitamin D 500-200 MG-UNIT per tablet  Commonly known as:  OSCAL WITH D  Take 1 tablet by mouth.     diltiazem 180 MG 24 hr capsule  Commonly known as:  DILACOR XR  Take 180 mg by mouth daily.     divalproex 250 MG 24 hr tablet  Commonly known as:  DEPAKOTE ER  Take 500 mg by mouth at bedtime. Take two capsules at bedtime      fentaNYL 25 MCG/HR patch  Commonly known as:  DURAGESIC - dosed mcg/hr  Place 25 mcg onto the skin every 3 (three) days.     ferrous sulfate 325 (65 FE) MG tablet  Take 1 tablet (325 mg total) by mouth 3 (three) times daily with meals.     hydrocortisone 25 MG suppository  Commonly known as:  ANUSOL-HC  Place 25 mg rectally 2 (two) times daily.     insulin glargine 100 UNIT/ML injection  Commonly known as:  LANTUS  Inject 8 Units into the skin at bedtime.     latanoprost 0.005 % ophthalmic solution  Commonly known as:  XALATAN  Place 1 drop into both eyes at bedtime.     levothyroxine 75 MCG tablet  Commonly known as:  SYNTHROID, LEVOTHROID  Take 75 mcg by mouth daily before breakfast.     meclizine 25 MG tablet  Commonly known as:  ANTIVERT  Take 25 mg by mouth every morning.     omeprazole 20 MG capsule  Commonly known as:  PRILOSEC  Take 20 mg by mouth daily.     oxyCODONE-acetaminophen 5-325 MG per tablet  Commonly known as:  PERCOCET/ROXICET  Take 1 tablet by mouth every 12 (twelve) hours as needed for severe pain (pain).     venlafaxine XR 150 MG 24 hr capsule  Commonly known as:  EFFEXOR-XR  Take 150 mg by mouth daily with breakfast.     Vitamin D (Ergocalciferol) 50000 UNITS Caps capsule  Commonly known as:  DRISDOL  Take 50,000 Units by mouth every 7 (seven) days.       Allergies  Allergen Reactions  . Gabapentin     unknown  . Lorazepam     unknown  . Metoclopramide     unknown  . Penicillins     unknown  . Tape     unknown  . Teflaro [Ceftaroline]     unknown      The results of significant diagnostics from this hospitalization (including imaging, microbiology, ancillary and laboratory) are listed below for reference.    Significant Diagnostic Studies: No results found.  Microbiology: Recent Results (from the past 240 hour(s))  MRSA PCR SCREENING     Status: None   Collection Time    09/12/13 11:42 PM      Result Value Range  Status   MRSA by PCR NEGATIVE  NEGATIVE Final   Comment:            The GeneXpert MRSA Assay (FDA     approved for NASAL specimens     only), is one component of a     comprehensive MRSA colonization     surveillance program. It is  not     intended to diagnose MRSA     infection nor to guide or     monitor treatment for     MRSA infections.     Labs: Basic Metabolic Panel:  Recent Labs Lab 09/12/13 1530 09/12/13 1602 09/13/13 0508  NA 141 140 143  K 4.2 4.1 3.9  CL 102 102 103  CO2 25  --  28  GLUCOSE 143* 140* 104*  BUN 19 19 14   CREATININE 0.95 1.10 0.79  CALCIUM 8.4  --  7.6*   Liver Function Tests:  Recent Labs Lab 09/12/13 1530  AST 28  ALT 20  ALKPHOS 82  BILITOT <0.2*  PROT 5.4*  ALBUMIN 2.5*   No results found for this basename: LIPASE, AMYLASE,  in the last 168 hours No results found for this basename: AMMONIA,  in the last 168 hours CBC:  Recent Labs Lab 09/12/13 1530 09/12/13 1602 09/13/13 0508 09/13/13 1252  WBC 5.8  --  7.0  --   NEUTROABS 3.3  --   --   --   HGB 4.8* 5.8* 8.1* 9.6*  HCT 15.8* 17.0* 25.1* 29.3*  MCV 105.3*  --  92.3  --   PLT 225  --  211  --    Cardiac Enzymes: No results found for this basename: CKTOTAL, CKMB, CKMBINDEX, TROPONINI,  in the last 168 hours BNP: BNP (last 3 results) No results found for this basename: PROBNP,  in the last 8760 hours CBG:  Recent Labs Lab 09/12/13 2158 09/13/13 0721 09/13/13 1144  GLUCAP 99 91 116*       Signed:  Dondrell Loudermilk  [facility] 09/13/2013, 1:59 PM

## 2013-09-13 NOTE — Progress Notes (Signed)
UR completed. Patient changed to inpatient r/t requiring PRBC for symptomatic anemia

## 2013-09-13 NOTE — Progress Notes (Signed)
ALF was called to confirm that pt would receive HHPT, left VM for Crystal, RN of [phone]).

## 2013-09-14 LAB — TYPE AND SCREEN
ABO/RH(D): O POS
Antibody Screen: NEGATIVE
UNIT DIVISION: 0
Unit division: 0
Unit division: 0

## 2014-08-26 ENCOUNTER — Emergency Department (HOSPITAL_COMMUNITY): Payer: Medicare Other

## 2014-08-26 ENCOUNTER — Encounter (HOSPITAL_COMMUNITY): Payer: Self-pay | Admitting: Oncology

## 2014-08-26 ENCOUNTER — Inpatient Hospital Stay (HOSPITAL_COMMUNITY)
Admission: EM | Admit: 2014-08-26 | Discharge: 2014-09-04 | DRG: 394 | Disposition: A | Discharge status: Skilled Nursing Facility | Payer: Medicare Other | Attending: Internal Medicine | Admitting: Internal Medicine

## 2014-08-26 DIAGNOSIS — K551 Chronic vascular disorders of intestine: Secondary | ICD-10-CM | POA: Diagnosis not present

## 2014-08-26 DIAGNOSIS — N189 Chronic kidney disease, unspecified: Secondary | ICD-10-CM

## 2014-08-26 DIAGNOSIS — R54 Age-related physical debility: Secondary | ICD-10-CM | POA: Diagnosis present

## 2014-08-26 DIAGNOSIS — K529 Noninfective gastroenteritis and colitis, unspecified: Secondary | ICD-10-CM

## 2014-08-26 DIAGNOSIS — R4182 Altered mental status, unspecified: Secondary | ICD-10-CM | POA: Diagnosis not present

## 2014-08-26 DIAGNOSIS — Z66 Do not resuscitate: Secondary | ICD-10-CM | POA: Diagnosis present

## 2014-08-26 DIAGNOSIS — R6884 Jaw pain: Secondary | ICD-10-CM

## 2014-08-26 DIAGNOSIS — I959 Hypotension, unspecified: Secondary | ICD-10-CM | POA: Diagnosis present

## 2014-08-26 DIAGNOSIS — G9340 Encephalopathy, unspecified: Secondary | ICD-10-CM | POA: Diagnosis present

## 2014-08-26 DIAGNOSIS — D649 Anemia, unspecified: Secondary | ICD-10-CM | POA: Diagnosis present

## 2014-08-26 DIAGNOSIS — Z794 Long term (current) use of insulin: Secondary | ICD-10-CM

## 2014-08-26 DIAGNOSIS — D72829 Elevated white blood cell count, unspecified: Secondary | ICD-10-CM

## 2014-08-26 DIAGNOSIS — E512 Wernicke's encephalopathy: Secondary | ICD-10-CM | POA: Diagnosis present

## 2014-08-26 DIAGNOSIS — E872 Acidosis, unspecified: Secondary | ICD-10-CM | POA: Diagnosis present

## 2014-08-26 DIAGNOSIS — K5730 Diverticulosis of large intestine without perforation or abscess without bleeding: Secondary | ICD-10-CM | POA: Diagnosis present

## 2014-08-26 DIAGNOSIS — K648 Other hemorrhoids: Secondary | ICD-10-CM | POA: Diagnosis present

## 2014-08-26 DIAGNOSIS — N179 Acute kidney failure, unspecified: Secondary | ICD-10-CM

## 2014-08-26 DIAGNOSIS — E876 Hypokalemia: Secondary | ICD-10-CM | POA: Diagnosis present

## 2014-08-26 DIAGNOSIS — E119 Type 2 diabetes mellitus without complications: Secondary | ICD-10-CM

## 2014-08-26 DIAGNOSIS — I214 Non-ST elevation (NSTEMI) myocardial infarction: Secondary | ICD-10-CM | POA: Diagnosis present

## 2014-08-26 DIAGNOSIS — R7989 Other specified abnormal findings of blood chemistry: Secondary | ICD-10-CM | POA: Diagnosis present

## 2014-08-26 DIAGNOSIS — E039 Hypothyroidism, unspecified: Secondary | ICD-10-CM | POA: Diagnosis present

## 2014-08-26 DIAGNOSIS — F1123 Opioid dependence with withdrawal: Secondary | ICD-10-CM | POA: Diagnosis present

## 2014-08-26 DIAGNOSIS — I129 Hypertensive chronic kidney disease with stage 1 through stage 4 chronic kidney disease, or unspecified chronic kidney disease: Secondary | ICD-10-CM | POA: Diagnosis present

## 2014-08-26 DIAGNOSIS — K625 Hemorrhage of anus and rectum: Secondary | ICD-10-CM | POA: Diagnosis present

## 2014-08-26 DIAGNOSIS — R9431 Abnormal electrocardiogram [ECG] [EKG]: Secondary | ICD-10-CM | POA: Diagnosis present

## 2014-08-26 DIAGNOSIS — N183 Chronic kidney disease, stage 3 (moderate): Secondary | ICD-10-CM | POA: Diagnosis present

## 2014-08-26 DIAGNOSIS — Z79899 Other long term (current) drug therapy: Secondary | ICD-10-CM

## 2014-08-26 DIAGNOSIS — F10239 Alcohol dependence with withdrawal, unspecified: Secondary | ICD-10-CM | POA: Diagnosis present

## 2014-08-26 DIAGNOSIS — E860 Dehydration: Secondary | ICD-10-CM | POA: Diagnosis present

## 2014-08-26 DIAGNOSIS — R262 Difficulty in walking, not elsewhere classified: Secondary | ICD-10-CM | POA: Diagnosis present

## 2014-08-26 DIAGNOSIS — E0781 Sick-euthyroid syndrome: Secondary | ICD-10-CM | POA: Diagnosis present

## 2014-08-26 DIAGNOSIS — R778 Other specified abnormalities of plasma proteins: Secondary | ICD-10-CM | POA: Diagnosis present

## 2014-08-26 DIAGNOSIS — F1120 Opioid dependence, uncomplicated: Secondary | ICD-10-CM | POA: Diagnosis present

## 2014-08-26 LAB — BLOOD GAS, ARTERIAL
Acid-base deficit: 10 mmol/L — ABNORMAL HIGH (ref 0.0–2.0)
Bicarbonate: 16.2 mEq/L — ABNORMAL LOW (ref 20.0–24.0)
DRAWN BY: 31814
FIO2: 0.21 %
O2 Saturation: 92.8 %
PCO2 ART: 38.5 mmHg (ref 35.0–45.0)
PH ART: 7.246 — AB (ref 7.350–7.450)
PO2 ART: 66.7 mmHg — AB (ref 80.0–100.0)
Patient temperature: 98.1
TCO2: 15.5 mmol/L (ref 0–100)

## 2014-08-26 LAB — URINALYSIS, ROUTINE W REFLEX MICROSCOPIC
GLUCOSE, UA: NEGATIVE mg/dL
HGB URINE DIPSTICK: NEGATIVE
KETONES UR: NEGATIVE mg/dL
Nitrite: NEGATIVE
PROTEIN: 30 mg/dL — AB
Specific Gravity, Urine: 1.02 (ref 1.005–1.030)
UROBILINOGEN UA: 0.2 mg/dL (ref 0.0–1.0)
pH: 5 (ref 5.0–8.0)

## 2014-08-26 LAB — CBG MONITORING, ED: Glucose-Capillary: 113 mg/dL — ABNORMAL HIGH (ref 70–99)

## 2014-08-26 LAB — CBC WITH DIFFERENTIAL/PLATELET
BASOS ABS: 0 10*3/uL (ref 0.0–0.1)
Basophils Relative: 0 % (ref 0–1)
EOS ABS: 0 10*3/uL (ref 0.0–0.7)
EOS PCT: 0 % (ref 0–5)
HCT: 33.9 % — ABNORMAL LOW (ref 36.0–46.0)
Hemoglobin: 11.1 g/dL — ABNORMAL LOW (ref 12.0–15.0)
Lymphocytes Relative: 12 % (ref 12–46)
Lymphs Abs: 2.1 10*3/uL (ref 0.7–4.0)
MCH: 33.3 pg (ref 26.0–34.0)
MCHC: 32.7 g/dL (ref 30.0–36.0)
MCV: 101.8 fL — AB (ref 78.0–100.0)
Monocytes Absolute: 2 10*3/uL — ABNORMAL HIGH (ref 0.1–1.0)
Monocytes Relative: 11 % (ref 3–12)
NEUTROS PCT: 77 % (ref 43–77)
Neutro Abs: 13.9 10*3/uL — ABNORMAL HIGH (ref 1.7–7.7)
Platelets: 295 10*3/uL (ref 150–400)
RBC: 3.33 MIL/uL — AB (ref 3.87–5.11)
RDW: 14.5 % (ref 11.5–15.5)
WBC: 18 10*3/uL — AB (ref 4.0–10.5)

## 2014-08-26 LAB — URINE MICROSCOPIC-ADD ON

## 2014-08-26 LAB — TYPE AND SCREEN
ABO/RH(D): O POS
ANTIBODY SCREEN: NEGATIVE

## 2014-08-26 LAB — I-STAT CG4 LACTIC ACID, ED: LACTIC ACID, VENOUS: 1.66 mmol/L (ref 0.5–2.2)

## 2014-08-26 LAB — COMPREHENSIVE METABOLIC PANEL
ALT: 26 U/L (ref 0–35)
AST: 28 U/L (ref 0–37)
Albumin: 2.9 g/dL — ABNORMAL LOW (ref 3.5–5.2)
Alkaline Phosphatase: 152 U/L — ABNORMAL HIGH (ref 39–117)
Anion gap: 20 — ABNORMAL HIGH (ref 5–15)
BUN: 34 mg/dL — AB (ref 6–23)
CALCIUM: 9.5 mg/dL (ref 8.4–10.5)
CHLORIDE: 95 meq/L — AB (ref 96–112)
CO2: 18 meq/L — AB (ref 19–32)
CREATININE: 2.98 mg/dL — AB (ref 0.50–1.10)
GFR, EST AFRICAN AMERICAN: 15 mL/min — AB (ref >90)
GFR, EST NON AFRICAN AMERICAN: 13 mL/min — AB (ref >90)
GLUCOSE: 117 mg/dL — AB (ref 70–99)
Potassium: 3.6 mEq/L — ABNORMAL LOW (ref 3.7–5.3)
Sodium: 133 mEq/L — ABNORMAL LOW (ref 137–147)
Total Bilirubin: 0.2 mg/dL — ABNORMAL LOW (ref 0.3–1.2)
Total Protein: 6.3 g/dL (ref 6.0–8.3)

## 2014-08-26 LAB — I-STAT TROPONIN, ED: Troponin i, poc: 0.11 ng/mL (ref 0.00–0.08)

## 2014-08-26 LAB — ETHANOL

## 2014-08-26 LAB — VALPROIC ACID LEVEL: Valproic Acid Lvl: 46.4 ug/mL — ABNORMAL LOW (ref 50.0–100.0)

## 2014-08-26 LAB — POC OCCULT BLOOD, ED: Fecal Occult Bld: POSITIVE — AB

## 2014-08-26 MED ORDER — IOHEXOL 300 MG/ML  SOLN: 50.0000 mL | Freq: Once | INTRAMUSCULAR | Status: AC | PRN

## 2014-08-26 MED ORDER — SODIUM CHLORIDE 0.9 % IV BOLUS (SEPSIS)
1000.0000 mL | Freq: Once | INTRAVENOUS | Status: AC
  Administered 2014-08-26: 1000 mL via INTRAVENOUS

## 2014-08-26 MED ORDER — THIAMINE HCL 100 MG/ML IJ SOLN
100.0000 mg | Freq: Once | INTRAMUSCULAR | Status: AC
  Administered 2014-08-26: 100 mg via INTRAVENOUS
  Filled 2014-08-26: qty 2

## 2014-08-26 NOTE — ED Notes (Signed)
Bed: WA17 Expected date:  Expected time:  Means of arrival:  Comments: EMS 80yo F, confusion, failure to thrive

## 2014-08-26 NOTE — ED Notes (Signed)
Per EMS pt's husband said pt did not sleep well last night and has been in the bed all day.  Pt has not voided or had a BM today.  Pt c/o lower back pain. Pleasantly confused at present.

## 2014-08-26 NOTE — ED Provider Notes (Signed)
CSN: [id number]     Arrival date & time 08/26/14  2047 History   First MD Initiated Contact with Patient 08/26/14 2051     Chief Complaint  Patient presents with  . Altered Mental Status   Level V caveat altered mental status, unstable vital signs history is obtained from patient's son and fromHabib Kassim, medical technician at assisted-living facility  (Consider location/radiation/quality/duration/timing/severity/associated sxs/prior Treatment) HPI Patient noted to be pale and had blood pressure 90/40  at7 PM tonight as witnessed by Mr.Kassim. She appeared more confused. Her son who accompanies her states that she appears more confused than at baseline today. He last saw HER2 days ago. Patient's husband reported to Mr.Kassim that she had not eaten at all today. There is no report of any vomiting. No treatment prior to coming here. Patient son reports that she drinks too much alcohol and is prescribed pain medicine which she tends to mixed together Past Medical History  Diagnosis Date  . Diabetes mellitus   . Thyroid disease   . Complication of anesthesia     trouble intubating surgery  . Difficult intubation   . Arthritis    Past Surgical History  Procedure Laterality Date  . Abdominal hysterectomy    . Hemorroidectomy    . Medial partial knee replacement    . Thyroidectomy, partial    . Shoulder surgery Right     "had shredded a muscle"  . Achilles tendon surgery Left   . Foot surgery Left     left heel infection that went to bone per spouse  . Lower abdominel surgery     History reviewed. No pertinent family history. History  Substance Use Topics  . Smoking status: Never Smoker   . Smokeless tobacco: Never Used  . Alcohol Use: Yes     Comment: red wine with evening meal   heavy alcohol use no illicit drug use OB History    No data available     Review of Systems  Unable to perform ROS Constitutional: Positive for appetite change.   unable to perform review of  systems, altered mental status unstable vital signs, patient hypotensive    Allergies  Gabapentin; Lorazepam; Metoclopramide; Penicillins; Tape; and Teflaro  Home Medications   Prior to Admission medications   Medication Sig Start Date End Date Taking? Authorizing Provider  allopurinol (ZYLOPRIM) 100 MG tablet Take 100 mg by mouth 2 (two) times daily.    Historical Provider, MD  baclofen (LIORESAL) 10 MG tablet Take 10 mg by mouth at bedtime.    Historical Provider, MD  beta carotene w/minerals (OCUVITE) tablet Take 1 tablet by mouth 2 (two) times daily.    Historical Provider, MD  calcium-vitamin D (OSCAL WITH D) 500-200 MG-UNIT per tablet Take 1 tablet by mouth.    Historical Provider, MD  diltiazem (DILACOR XR) 180 MG 24 hr capsule Take 180 mg by mouth daily.    Historical Provider, MD  divalproex (DEPAKOTE ER) 250 MG 24 hr tablet Take 500 mg by mouth at bedtime. Take two capsules at bedtime    Historical Provider, MD  fentaNYL (DURAGESIC - DOSED MCG/HR) 25 MCG/HR patch Place 25 mcg onto the skin every 3 (three) days.    Historical Provider, MD  ferrous sulfate 325 (65 FE) MG tablet Take 1 tablet (325 mg total) by mouth 3 (three) times daily with meals. 09/13/13   Velvet Bathe, MD  hydrocortisone (ANUSOL-HC) 25 MG suppository Place 25 mg rectally 2 (two) times daily. 09/09/13   Historical  Provider, MD  insulin glargine (LANTUS) 100 UNIT/ML injection Inject 8 Units into the skin at bedtime.    Historical Provider, MD  latanoprost (XALATAN) 0.005 % ophthalmic solution Place 1 drop into both eyes at bedtime.    Historical Provider, MD  levothyroxine (SYNTHROID, LEVOTHROID) 75 MCG tablet Take 75 mcg by mouth daily before breakfast.    Historical Provider, MD  meclizine (ANTIVERT) 25 MG tablet Take 25 mg by mouth every morning.    Historical Provider, MD  omeprazole (PRILOSEC) 20 MG capsule Take 20 mg by mouth daily.    Historical Provider, MD  oxyCODONE-acetaminophen (PERCOCET/ROXICET) 5-325 MG  per tablet Take 1 tablet by mouth every 12 (twelve) hours as needed for severe pain (pain).    Historical Provider, MD  venlafaxine XR (EFFEXOR-XR) 150 MG 24 hr capsule Take 150 mg by mouth daily with breakfast.    Historical Provider, MD  Vitamin D, Ergocalciferol, (DRISDOL) 50000 UNITS CAPS capsule Take 50,000 Units by mouth every 7 (seven) days.    Historical Provider, MD   SpO2 98% Physical Exam  Constitutional:  Ill-appearing  HENT:  Head: Normocephalic and atraumatic.  Mucus memory and dry there is a grayish coating around lips and on time  Eyes: Conjunctivae are normal. Pupils are equal, round, and reactive to light.  Neck: Neck supple. No tracheal deviation present. No thyromegaly present.  Cardiovascular: Normal rate and regular rhythm.   No murmur heard. Pulmonary/Chest: Effort normal and breath sounds normal.  Abdominal: Soft. Bowel sounds are normal. She exhibits no distension. There is tenderness.  Diffusely tender  Genitourinary:  Rectum small amount of gross blood  Musculoskeletal: Normal range of motion. She exhibits no edema or tenderness.  Neurological: She is alert. Coordination normal.  Follow simple commands moves all extremities  Skin: Skin is warm and dry. No rash noted.  Psychiatric: She has a normal mood and affect.  Nursing note and vitals reviewed.   ED Course  Procedures (including critical care time) Labs Review Labs Reviewed - No data to display  Imaging Review No results found.   EKG Interpretation   Date/Time:  Sunday August 26 2014 21:13:48 EST Ventricular Rate:  80 PR Interval:  189 QRS Duration: 89 QT Interval:  415 QTC Calculation: 479 R Axis:   72 Text Interpretation:  Sinus rhythm Repol abnrm suggests ischemia,  anterolateral Confirmed by Winfred Leeds  MD, Eydie Wormley [phone]) on 08/26/2014  10:06:50 PM     Duragesic patch removed  At 12 midnight patient is alert she states "nothing hurts "I feel good" Results for orders placed or  performed during the hospital encounter of 08/26/14  Comprehensive metabolic panel  Result Value Ref Range   Sodium 133 (L) 137 - 147 mEq/L   Potassium 3.6 (L) 3.7 - 5.3 mEq/L   Chloride 95 (L) 96 - 112 mEq/L   CO2 18 (L) 19 - 32 mEq/L   Glucose, Bld 117 (H) 70 - 99 mg/dL   BUN 34 (H) 6 - 23 mg/dL   Creatinine, Ser 2.98 (H) 0.50 - 1.10 mg/dL   Calcium 9.5 8.4 - 10.5 mg/dL   Total Protein 6.3 6.0 - 8.3 g/dL   Albumin 2.9 (L) 3.5 - 5.2 g/dL   AST 28 0 - 37 U/L   ALT 26 0 - 35 U/L   Alkaline Phosphatase 152 (H) 39 - 117 U/L   Total Bilirubin 0.2 (L) 0.3 - 1.2 mg/dL   GFR calc non Af Amer 13 (L) >90 mL/min   GFR calc  Af Amer 15 (L) >90 mL/min   Anion gap 20 (H) 5 - 15  CBC with Differential  Result Value Ref Range   WBC 18.0 (H) 4.0 - 10.5 K/uL   RBC 3.33 (L) 3.87 - 5.11 MIL/uL   Hemoglobin 11.1 (L) 12.0 - 15.0 g/dL   HCT 33.9 (L) 36.0 - 46.0 %   MCV 101.8 (H) 78.0 - 100.0 fL   MCH 33.3 26.0 - 34.0 pg   MCHC 32.7 30.0 - 36.0 g/dL   RDW 14.5 11.5 - 15.5 %   Platelets 295 150 - 400 K/uL   Neutrophils Relative % 77 43 - 77 %   Neutro Abs 13.9 (H) 1.7 - 7.7 K/uL   Lymphocytes Relative 12 12 - 46 %   Lymphs Abs 2.1 0.7 - 4.0 K/uL   Monocytes Relative 11 3 - 12 %   Monocytes Absolute 2.0 (H) 0.1 - 1.0 K/uL   Eosinophils Relative 0 0 - 5 %   Eosinophils Absolute 0.0 0.0 - 0.7 K/uL   Basophils Relative 0 0 - 1 %   Basophils Absolute 0.0 0.0 - 0.1 K/uL  Urinalysis, Routine w reflex microscopic  Result Value Ref Range   Color, Urine YELLOW YELLOW   APPearance CLEAR CLEAR   Specific Gravity, Urine 1.020 1.005 - 1.030   pH 5.0 5.0 - 8.0   Glucose, UA NEGATIVE NEGATIVE mg/dL   Hgb urine dipstick NEGATIVE NEGATIVE   Bilirubin Urine SMALL (A) NEGATIVE   Ketones, ur NEGATIVE NEGATIVE mg/dL   Protein, ur 30 (A) NEGATIVE mg/dL   Urobilinogen, UA 0.2 0.0 - 1.0 mg/dL   Nitrite NEGATIVE NEGATIVE   Leukocytes, UA TRACE (A) NEGATIVE  Blood gas, arterial ([facility] & [facility] ONLY)  Result Value Ref  Range   FIO2 0.21 %   pH, Arterial 7.246 (L) 7.350 - 7.450   pCO2 arterial 38.5 35.0 - 45.0 mmHg   pO2, Arterial 66.7 (L) 80.0 - 100.0 mmHg   Bicarbonate 16.2 (L) 20.0 - 24.0 mEq/L   TCO2 15.5 0 - 100 mmol/L   Acid-base deficit 10.0 (H) 0.0 - 2.0 mmol/L   O2 Saturation 92.8 %   Patient temperature 98.1    Collection site LEFT RADIAL    Drawn by [phone]    Sample type ARTERIAL    Allens test (pass/fail) PASS PASS  Ethanol  Result Value Ref Range   Alcohol, Ethyl (B) <11 0 - 11 mg/dL  Valproic acid level  Result Value Ref Range   Valproic Acid Lvl 46.4 (L) 50.0 - 100.0 ug/mL  Urine microscopic-add on  Result Value Ref Range   Squamous Epithelial / LPF RARE RARE   WBC, UA 3-6 <3 WBC/hpf   RBC / HPF 0-2 <3 RBC/hpf   Bacteria, UA FEW (A) RARE  POC occult blood, ED Provider will collect  Result Value Ref Range   Fecal Occult Bld POSITIVE (A) NEGATIVE  I-stat troponin, ED  Result Value Ref Range   Troponin i, poc 0.11 (HH) 0.00 - 0.08 ng/mL   Comment NOTIFIED PHYSICIAN    Comment 3          I-Stat CG4 Lactic Acid, ED  Result Value Ref Range   Lactic Acid, Venous 1.66 0.5 - 2.2 mmol/L  POC CBG, ED  Result Value Ref Range   Glucose-Capillary 113 (H) 70 - 99 mg/dL  Type and screen  Result Value Ref Range   ABO/RH(D) O POS    Antibody Screen NEG    Sample Expiration  08/29/2014    Ct Abdomen Pelvis Wo Contrast  08/26/2014   CLINICAL DATA:  Altered mental status, unable to void  EXAM: CT ABDOMEN AND PELVIS WITHOUT CONTRAST  TECHNIQUE: Multidetector CT imaging of the abdomen and pelvis was performed following the standard protocol without IV contrast.  COMPARISON:  01/28/2010  FINDINGS: Dependent bibasilar atelectasis noted.  Unenhanced liver, adrenal glands, spleen, and pancreas are unremarkable. Motion artifact degrades imaging. Exophytic right lower renal pole 0.8 cm probable hyperdense cyst is identified, unchanged from the prior exam. Sub cm low-density bilateral renal cortical  lesions elsewhere are reidentified but not further characterized on the current noncontrast exam. No free fluid or air. No lymphadenopathy.  Normal appendix, image 59. No bowel wall thickening or focal segmental dilatation is identified. Bladder is decompressed but unremarkable. Uterus not visualized. Ovaries are normal. Moderate atheromatous aortic calcification noted without aneurysm. Multilevel disc degenerative change noted in the spine.  IMPRESSION: No acute intra-abdominal or pelvic pathology.   Electronically Signed   By: Conchita Paris M.D.   On: 08/26/2014 23:47   Ct Head Wo Contrast  08/26/2014   CLINICAL DATA:  Acute onset of confusion. Patient has not voided today. Initial encounter.  EXAM: CT HEAD WITHOUT CONTRAST  TECHNIQUE: Contiguous axial images were obtained from the base of the skull through the vertex without intravenous contrast.  COMPARISON:  CT of the head performed 01/17/2010  FINDINGS: There is no evidence of acute infarction, mass lesion, or intra- or extra-axial hemorrhage on CT.  Prominence of the ventricles and sulci reflects mild to moderate cortical volume loss. Cerebellar atrophy is noted. Mild periventricular and subcortical white matter change likely reflects small vessel ischemic microangiopathy.  The brainstem and fourth ventricle are within normal limits. The basal ganglia are unremarkable in appearance. The cerebral hemispheres demonstrate grossly normal gray-white differentiation. No mass effect or midline shift is seen.  There is no evidence of fracture; visualized osseous structures are unremarkable in appearance. The orbits are within normal limits. Dense inspissated mucus is noted within the right maxillary sinus. The remaining paranasal sinuses and mastoid air cells are well-aerated. No significant soft tissue abnormalities are seen.  IMPRESSION: 1. No acute intracranial pathology seen on CT. 2. Mild to moderate cortical volume loss and scattered small vessel ischemic  microangiopathy. 3. Dense inspissated mucus noted within the right maxillary sinus.   Electronically Signed   By: Garald Balding M.D.   On: 08/26/2014 23:46   Dg Chest Portable 1 View  08/26/2014   CLINICAL DATA:  Altered mental status, diabetes  EXAM: PORTABLE CHEST - 1 VIEW  COMPARISON:  None  FINDINGS: Heart size is within normal limits. The aorta is unfolded and ectatic. No focal pulmonary opacity. Evidence of arose origin or remote resection of the distal right clavicle. No acute osseous abnormality. No pleural effusion.  IMPRESSION: No acute cardiopulmonary process.   Electronically Signed   By: Conchita Paris M.D.   On: 08/26/2014 22:37    MDM  Lab work consistent with metabolic acidosis, and renal failure. Elevated troponin may be indicative of  NSTEMI but can also be elevated for other reasons . I spoke with Dr. Arnoldo Morale, who will admit patient and requested cardiology consult . I spoke withDr Kennith Center from cardiology service who suggested order CK-MB if CK-MB elevated hospitalist service should consult cardiology and they will see her tomorrow. If CK-MB is normal, he does not feel need for cardiology consult Final diagnoses:  None   plan admit step down unit  Diagnosis #1 acute encephalopathy #2 metabolic acidosis #3 renal failure #4 hypotension  CRITICAL CARE Performed by: Orlie Dakin Total critical care time: 45 minute Critical care time was exclusive of separately billable procedures and treating other patients. Critical care was necessary to treat or prevent imminent or life-threatening deterioration. Critical care was time spent personally by me on the following activities: development of treatment plan with patient and/or surrogate as well as nursing, discussions with consultants, evaluation of patient's response to treatment, examination of patient, obtaining history from patient or surrogate, ordering and performing treatments and interventions, ordering and review of  laboratory studies, ordering and review of radiographic studies, pulse oximetry and re-evaluation of patient's condition.    Orlie Dakin, MD 08/27/14 [phone]

## 2014-08-27 ENCOUNTER — Inpatient Hospital Stay (HOSPITAL_COMMUNITY): Payer: Medicare Other

## 2014-08-27 DIAGNOSIS — E119 Type 2 diabetes mellitus without complications: Secondary | ICD-10-CM

## 2014-08-27 DIAGNOSIS — K551 Chronic vascular disorders of intestine: Secondary | ICD-10-CM | POA: Diagnosis present

## 2014-08-27 DIAGNOSIS — N189 Chronic kidney disease, unspecified: Secondary | ICD-10-CM

## 2014-08-27 DIAGNOSIS — E512 Wernicke's encephalopathy: Secondary | ICD-10-CM | POA: Diagnosis present

## 2014-08-27 DIAGNOSIS — K648 Other hemorrhoids: Secondary | ICD-10-CM | POA: Diagnosis not present

## 2014-08-27 DIAGNOSIS — E039 Hypothyroidism, unspecified: Secondary | ICD-10-CM | POA: Diagnosis not present

## 2014-08-27 DIAGNOSIS — Z794 Long term (current) use of insulin: Secondary | ICD-10-CM | POA: Diagnosis not present

## 2014-08-27 DIAGNOSIS — E876 Hypokalemia: Secondary | ICD-10-CM | POA: Diagnosis not present

## 2014-08-27 DIAGNOSIS — R7989 Other specified abnormal findings of blood chemistry: Secondary | ICD-10-CM | POA: Diagnosis present

## 2014-08-27 DIAGNOSIS — K625 Hemorrhage of anus and rectum: Secondary | ICD-10-CM | POA: Diagnosis present

## 2014-08-27 DIAGNOSIS — E860 Dehydration: Secondary | ICD-10-CM | POA: Diagnosis not present

## 2014-08-27 DIAGNOSIS — R4182 Altered mental status, unspecified: Secondary | ICD-10-CM | POA: Diagnosis present

## 2014-08-27 DIAGNOSIS — I214 Non-ST elevation (NSTEMI) myocardial infarction: Secondary | ICD-10-CM | POA: Diagnosis present

## 2014-08-27 DIAGNOSIS — N183 Chronic kidney disease, stage 3 (moderate): Secondary | ICD-10-CM | POA: Diagnosis not present

## 2014-08-27 DIAGNOSIS — Z66 Do not resuscitate: Secondary | ICD-10-CM | POA: Diagnosis not present

## 2014-08-27 DIAGNOSIS — N179 Acute kidney failure, unspecified: Secondary | ICD-10-CM | POA: Diagnosis present

## 2014-08-27 DIAGNOSIS — I959 Hypotension, unspecified: Secondary | ICD-10-CM | POA: Diagnosis present

## 2014-08-27 DIAGNOSIS — R54 Age-related physical debility: Secondary | ICD-10-CM | POA: Diagnosis not present

## 2014-08-27 DIAGNOSIS — E118 Type 2 diabetes mellitus with unspecified complications: Secondary | ICD-10-CM

## 2014-08-27 DIAGNOSIS — G9340 Encephalopathy, unspecified: Secondary | ICD-10-CM | POA: Diagnosis present

## 2014-08-27 DIAGNOSIS — E872 Acidosis, unspecified: Secondary | ICD-10-CM | POA: Diagnosis present

## 2014-08-27 DIAGNOSIS — D649 Anemia, unspecified: Secondary | ICD-10-CM | POA: Diagnosis not present

## 2014-08-27 DIAGNOSIS — R9431 Abnormal electrocardiogram [ECG] [EKG]: Secondary | ICD-10-CM | POA: Diagnosis not present

## 2014-08-27 DIAGNOSIS — F1123 Opioid dependence with withdrawal: Secondary | ICD-10-CM | POA: Diagnosis not present

## 2014-08-27 DIAGNOSIS — K5730 Diverticulosis of large intestine without perforation or abscess without bleeding: Secondary | ICD-10-CM | POA: Diagnosis not present

## 2014-08-27 DIAGNOSIS — F10239 Alcohol dependence with withdrawal, unspecified: Secondary | ICD-10-CM | POA: Diagnosis not present

## 2014-08-27 DIAGNOSIS — R262 Difficulty in walking, not elsewhere classified: Secondary | ICD-10-CM | POA: Diagnosis not present

## 2014-08-27 DIAGNOSIS — I9589 Other hypotension: Secondary | ICD-10-CM

## 2014-08-27 DIAGNOSIS — I129 Hypertensive chronic kidney disease with stage 1 through stage 4 chronic kidney disease, or unspecified chronic kidney disease: Secondary | ICD-10-CM | POA: Diagnosis not present

## 2014-08-27 DIAGNOSIS — E0781 Sick-euthyroid syndrome: Secondary | ICD-10-CM | POA: Diagnosis not present

## 2014-08-27 DIAGNOSIS — Z79899 Other long term (current) drug therapy: Secondary | ICD-10-CM | POA: Diagnosis not present

## 2014-08-27 DIAGNOSIS — R778 Other specified abnormalities of plasma proteins: Secondary | ICD-10-CM | POA: Diagnosis present

## 2014-08-27 LAB — BASIC METABOLIC PANEL
Anion gap: 17 — ABNORMAL HIGH (ref 5–15)
BUN: 31 mg/dL — ABNORMAL HIGH (ref 6–23)
CALCIUM: 8.3 mg/dL — AB (ref 8.4–10.5)
CHLORIDE: 103 meq/L (ref 96–112)
CO2: 16 mEq/L — ABNORMAL LOW (ref 19–32)
Creatinine, Ser: 2.56 mg/dL — ABNORMAL HIGH (ref 0.50–1.10)
GFR calc Af Amer: 18 mL/min — ABNORMAL LOW (ref >90)
GFR calc non Af Amer: 16 mL/min — ABNORMAL LOW (ref >90)
Glucose, Bld: 95 mg/dL (ref 70–99)
Potassium: 3.9 mEq/L (ref 3.7–5.3)
SODIUM: 136 meq/L — AB (ref 137–147)

## 2014-08-27 LAB — CK TOTAL AND CKMB (NOT AT ARMC)
CK TOTAL: 54 U/L (ref 7–177)
CK TOTAL: 54 U/L (ref 7–177)
CK, MB: 12.3 ng/mL (ref 0.3–4.0)
CK, MB: 12.2 ng/mL (ref 0.3–4.0)
CK, MB: 9.6 ng/mL (ref 0.3–4.0)
CK, MB: 9.2 ng/mL (ref 0.3–4.0)
RELATIVE INDEX: INVALID (ref 0.0–2.5)
RELATIVE INDEX: INVALID (ref 0.0–2.5)
Relative Index: INVALID (ref 0.0–2.5)
Relative Index: INVALID (ref 0.0–2.5)
Total CK: 55 U/L (ref 7–177)
Total CK: 61 U/L (ref 7–177)

## 2014-08-27 LAB — MRSA PCR SCREENING: MRSA by PCR: NEGATIVE

## 2014-08-27 LAB — GLUCOSE, CAPILLARY
GLUCOSE-CAPILLARY: 82 mg/dL (ref 70–99)
Glucose-Capillary: 90 mg/dL (ref 70–99)
Glucose-Capillary: 89 mg/dL (ref 70–99)
Glucose-Capillary: 86 mg/dL (ref 70–99)

## 2014-08-27 LAB — CBC
HEMATOCRIT: 35.5 % — AB (ref 36.0–46.0)
HEMOGLOBIN: 11.3 g/dL — AB (ref 12.0–15.0)
MCH: 32.8 pg (ref 26.0–34.0)
MCHC: 31.8 g/dL (ref 30.0–36.0)
MCV: 102.9 fL — ABNORMAL HIGH (ref 78.0–100.0)
Platelets: 232 10*3/uL (ref 150–400)
RBC: 3.45 MIL/uL — ABNORMAL LOW (ref 3.87–5.11)
RDW: 14.6 % (ref 11.5–15.5)
WBC: 13.5 10*3/uL — AB (ref 4.0–10.5)

## 2014-08-27 LAB — HEMOGLOBIN AND HEMATOCRIT, BLOOD
HCT: 31.6 % — ABNORMAL LOW (ref 36.0–46.0)
HEMATOCRIT: 31.5 % — AB (ref 36.0–46.0)
HEMOGLOBIN: 10.2 g/dL — AB (ref 12.0–15.0)
Hemoglobin: 10.1 g/dL — ABNORMAL LOW (ref 12.0–15.0)

## 2014-08-27 LAB — HEMOGLOBIN A1C
Hgb A1c MFr Bld: 5.8 % — ABNORMAL HIGH (ref <5.7)
MEAN PLASMA GLUCOSE: 120 mg/dL — AB (ref <117)

## 2014-08-27 LAB — TROPONIN I
Troponin I: <0.3 ng/mL (ref <0.30)
Troponin I: <0.3 ng/mL (ref <0.30)
Troponin I: <0.3 ng/mL (ref <0.30)

## 2014-08-27 LAB — TSH: TSH: 0.123 u[IU]/mL — ABNORMAL LOW (ref 0.350–4.500)

## 2014-08-27 MED ORDER — ALUM & MAG HYDROXIDE-SIMETH 200-200-20 MG/5ML PO SUSP: 30.0000 mL | Freq: Four times a day (QID) | ORAL | Status: DC | PRN

## 2014-08-27 MED ORDER — OXYCODONE HCL 5 MG PO TABS
5.0000 mg | ORAL_TABLET | ORAL | Status: DC | PRN
  Administered 2014-08-27 – 2014-08-28 (×3): 5 mg via ORAL
  Filled 2014-08-27 (×3): qty 1

## 2014-08-27 MED ORDER — HYDROMORPHONE HCL 1 MG/ML IJ SOLN
0.5000 mg | INTRAMUSCULAR | Status: DC | PRN
  Administered 2014-08-29: 0.5 mg via INTRAVENOUS
  Administered 2014-08-29 – 2014-08-30 (×6): 1 mg via INTRAVENOUS
  Filled 2014-08-27 (×7): qty 1

## 2014-08-27 MED ORDER — ACETAMINOPHEN 325 MG PO TABS
650.0000 mg | ORAL_TABLET | Freq: Four times a day (QID) | ORAL | Status: DC | PRN
  Administered 2014-08-30 – 2014-09-04 (×2): 650 mg via ORAL
  Filled 2014-08-27 (×2): qty 2

## 2014-08-27 MED ORDER — SODIUM CHLORIDE 0.9 % IV SOLN
INTRAVENOUS | Status: DC
  Administered 2014-08-27 – 2014-08-28 (×2): via INTRAVENOUS

## 2014-08-27 MED ORDER — INSULIN ASPART 100 UNIT/ML ~~LOC~~ SOLN
0.0000 [IU] | SUBCUTANEOUS | Status: DC
  Administered 2014-08-30 (×2): 2 [IU] via SUBCUTANEOUS
  Administered 2014-08-30 – 2014-08-31 (×3): 1 [IU] via SUBCUTANEOUS
  Administered 2014-09-01: 2 [IU] via SUBCUTANEOUS
  Administered 2014-09-02 (×2): 1 [IU] via SUBCUTANEOUS
  Administered 2014-09-02: 2 [IU] via SUBCUTANEOUS
  Administered 2014-09-03 (×2): 1 [IU] via SUBCUTANEOUS

## 2014-08-27 MED ORDER — ONDANSETRON HCL 4 MG PO TABS
4.0000 mg | ORAL_TABLET | Freq: Four times a day (QID) | ORAL | Status: DC | PRN
  Administered 2014-08-28: 4 mg via ORAL
  Filled 2014-08-27: qty 1

## 2014-08-27 MED ORDER — ONDANSETRON HCL 4 MG/2ML IJ SOLN
4.0000 mg | Freq: Four times a day (QID) | INTRAMUSCULAR | Status: DC | PRN
  Administered 2014-08-28: 4 mg via INTRAVENOUS
  Filled 2014-08-27: qty 2

## 2014-08-27 MED ORDER — PANTOPRAZOLE SODIUM 40 MG IV SOLR
40.0000 mg | Freq: Two times a day (BID) | INTRAVENOUS | Status: DC
  Administered 2014-08-27 – 2014-08-30 (×7): 40 mg via INTRAVENOUS
  Filled 2014-08-27 (×7): qty 40

## 2014-08-27 MED ORDER — LEVOTHYROXINE SODIUM 75 MCG PO TABS
75.0000 ug | ORAL_TABLET | Freq: Every day | ORAL | Status: DC
  Administered 2014-08-28 – 2014-09-04 (×7): 75 ug via ORAL
  Filled 2014-08-27 (×7): qty 1

## 2014-08-27 MED ORDER — ACETAMINOPHEN 650 MG RE SUPP: 650.0000 mg | Freq: Four times a day (QID) | RECTAL | Status: DC | PRN

## 2014-08-27 MED ORDER — SODIUM CHLORIDE 0.9 % IV SOLN
INTRAVENOUS | Status: AC
  Administered 2014-08-27: 03:00:00 via INTRAVENOUS

## 2014-08-27 NOTE — Progress Notes (Signed)
PROGRESS NOTE  Kaitlyn GheeRobye Burns ZOX:[id number] DOB: 05/28/1925 DOA: 08/26/2014 PCP: Brooke BonitoGALLEMORE,WARREN, MD  Brief history  80 year old female with a history of diabetes mellitus, CKD stage II-III, hypertension, hypothyroidism [facility]. Unfortunately, the patient is unable to provide any significant history. Attempts to contact family have been unsuccessful. The patient was noted to have acute kidney injury with elevated serum creatinine and elevated point-of-care troponin in the emergency department. In addition, fecal occult blood test was also positive. The patient was also noted to have relative hypotension with systolic blood pressures in the mid 90s. Notably, the patient was admitted in January 2015 with significant anemia with hemoglobin of 4.8. She has not had any endoscopy or GI follow-up.  During her January admission, the patient was given 3 units PRBC. Assessment/Plan: Acute encephalopathy -Multifactorial including anemia, acute kidney injury, dehydration, and possible ischemia -Check for reversible causes of encephalopathy including ammonia, TSH, B12, RBC folate Anemia with positive FOBT/melena -FOBT was positive in the emergency department -I have performed rectal exam on 12/21-->melena and positive FOBT again -I consulted GI -hold ASA for now -continue protonix  -The patient was noted to have iron deficiency back in January 2015 with iron saturation of 4% -CT brain negative for acute findings Acute on chronic renal failure (CKD2-3) -baseline creatinine 0.9-1.2 -renal us -continue IVF -Hold lisinopril Elevated point-of-care troponin/abnormal EKG -Cycle troponins--neg x 3 -no chest pain presently Diabetes mellitus type 2 -Hemoglobin A1c -NovoLog sliding scale Leukocytosis -Urinalysis without pyuria -Chest x-ray negative for infiltrates  -blood cultures 2 sets - as the patient's blood pressure is stable and she is  afebrile, we'll hold off on starting antibiotics for now and monitor clinically  Hypothyroidism  -TSH is 0.123  -Continue Synthroid  Relative hypotension  -Hold diltiazem and lisinopril    Family Communication:   Pt at beside Disposition Plan:   ALF vs SNF when medically stable      Procedures/Studies: Ct Abdomen Pelvis Wo Contrast  08/26/2014   CLINICAL DATA:  Altered mental status, unable to void  EXAM: CT ABDOMEN AND PELVIS WITHOUT CONTRAST  TECHNIQUE: Multidetector CT imaging of the abdomen and pelvis was performed following the standard protocol without IV contrast.  COMPARISON:  01/28/2010  FINDINGS: Dependent bibasilar atelectasis noted.  Unenhanced liver, adrenal glands, spleen, and pancreas are unremarkable. Motion artifact degrades imaging. Exophytic right lower renal pole 0.8 cm probable hyperdense cyst is identified, unchanged from the prior exam. Sub cm low-density bilateral renal cortical lesions elsewhere are reidentified but not further characterized on the current noncontrast exam. No free fluid or air. No lymphadenopathy.  Normal appendix, image 59. No bowel wall thickening or focal segmental dilatation is identified. Bladder is decompressed but unremarkable. Uterus not visualized. Ovaries are normal. Moderate atheromatous aortic calcification noted without aneurysm. Multilevel disc degenerative change noted in the spine.  IMPRESSION: No acute intra-abdominal or pelvic pathology.   Electronically Signed   By: Christiana PellantGretchen  Green M.D.   On: 08/26/2014 23:47   Ct Head Wo Contrast  08/26/2014   CLINICAL DATA:  Acute onset of confusion. Patient has not voided today. Initial encounter.  EXAM: CT HEAD WITHOUT CONTRAST  TECHNIQUE: Contiguous axial images were obtained from the base of the skull through the vertex without intravenous contrast.  COMPARISON:  CT of the head performed 01/17/2010  FINDINGS: There is no evidence of acute infarction, mass lesion, or intra- or extra-axial  hemorrhage on CT.  Prominence  of the ventricles and sulci reflects mild to moderate cortical volume loss. Cerebellar atrophy is noted. Mild periventricular and subcortical white matter change likely reflects small vessel ischemic microangiopathy.  The brainstem and fourth ventricle are within normal limits. The basal ganglia are unremarkable in appearance. The cerebral hemispheres demonstrate grossly normal gray-white differentiation. No mass effect or midline shift is seen.  There is no evidence of fracture; visualized osseous structures are unremarkable in appearance. The orbits are within normal limits. Dense inspissated mucus is noted within the right maxillary sinus. The remaining paranasal sinuses and mastoid air cells are well-aerated. No significant soft tissue abnormalities are seen.  IMPRESSION: 1. No acute intracranial pathology seen on CT. 2. Mild to moderate cortical volume loss and scattered small vessel ischemic microangiopathy. 3. Dense inspissated mucus noted within the right maxillary sinus.   Electronically Signed   By: Roanna RaiderJeffery  Chang M.D.   On: 08/26/2014 23:46   Dg Chest Portable 1 View  08/26/2014   CLINICAL DATA:  Altered mental status, diabetes  EXAM: PORTABLE CHEST - 1 VIEW  COMPARISON:  None  FINDINGS: Heart size is within normal limits. The aorta is unfolded and ectatic. No focal pulmonary opacity. Evidence of arose origin or remote resection of the distal right clavicle. No acute osseous abnormality. No pleural effusion.  IMPRESSION: No acute cardiopulmonary process.   Electronically Signed   By: Christiana PellantGretchen  Green M.D.   On: 08/26/2014 22:37         Subjective: Patient is pleasant but confused. No vomiting, diarrhea, respiratory distress, headache, uncontrolled pain   Objective: Filed Vitals:   08/27/14 0315 08/27/14 0400 08/27/14 0600 08/27/14 0800  BP: 113/30 115/31 122/33   Pulse:  73 76   Temp:    97.6 F (36.4 C)  TempSrc:    Oral  Resp: 13 18 18    Height:        Weight:      SpO2:  99% 99%     Intake/Output Summary (Last 24 hours) at 08/27/14 0842 Last data filed at 08/27/14 0700  Gross per 24 hour  Intake 648.33 ml  Output      0 ml  Net 648.33 ml   Weight change:  Exam:   General:  Pt is alert, follows commands appropriately, not in acute distress  HEENT: No icterus, No thrush,[place]/AT  Cardiovascular: RRR, S1/S2, no rubs, no gallops  Respiratory: CTA bilaterally, no wheezing, no crackles, no rhonchi  Abdomen: Soft/+BS, non tender, non distended, no guarding  Extremities: No edema, No lymphangitis, No petechiae, No rashes, no synovitis  Data Reviewed: Basic Metabolic Panel:  Recent Labs Lab 08/26/14 2134 08/27/14 0223  NA 133* 136*  K 3.6* 3.9  CL 95* 103  CO2 18* 16*  GLUCOSE 117* 95  BUN 34* 31*  CREATININE 2.98* 2.56*  CALCIUM 9.5 8.3*   Liver Function Tests:  Recent Labs Lab 08/26/14 2134  AST 28  ALT 26  ALKPHOS 152*  BILITOT 0.2*  PROT 6.3  ALBUMIN 2.9*   No results for input(s): LIPASE, AMYLASE in the last 168 hours. No results for input(s): AMMONIA in the last 168 hours. CBC:  Recent Labs Lab 08/26/14 2134 08/27/14 0223 08/27/14 0755  WBC 18.0* 13.5*  --   NEUTROABS 13.9*  --   --   HGB 11.1* 11.3* 10.1*  HCT 33.9* 35.5* 31.5*  MCV 101.8* 102.9*  --   PLT 295 232  --    Cardiac Enzymes:  Recent Labs Lab 08/27/14 0042 08/27/14  0223  CKTOTAL 55 61  CKMB 12.3* 12.2*  TROPONINI  --  <0.30   BNP: Invalid input(s): POCBNP CBG:  Recent Labs Lab 08/26/14 2158 08/27/14 0436 08/27/14 0742  GLUCAP 113* 90 89    Recent Results (from the past 240 hour(s))  MRSA PCR Screening     Status: None   Collection Time: 08/27/14  1:56 AM  Result Value Ref Range Status   MRSA by PCR NEGATIVE NEGATIVE Final    Comment:        The GeneXpert MRSA Assay (FDA approved for NASAL specimens only), is one component of a comprehensive MRSA colonization surveillance program. It is not intended to  diagnose MRSA infection nor to guide or monitor treatment for MRSA infections.      Scheduled Meds: . sodium chloride   Intravenous STAT  . insulin aspart  0-9 Units Subcutaneous 6 times per day  . pantoprazole (PROTONIX) IV  40 mg Intravenous Q12H   Continuous Infusions: . sodium chloride 75 mL/hr at 08/27/14 0700     Doyne Ellinger, DO  [facility] Pager [phone]  If 7PM-7AM, please contact night-coverage [email or web address] Password [email or web address] 08/27/2014, 8:42 AM   LOS: 1 day

## 2014-08-27 NOTE — Progress Notes (Signed)
Utilization review completed.  

## 2014-08-27 NOTE — H&P (Signed)
[facility] Admission History and Physical       Kaitlyn Burns ZOX:[id number] DOB: 03/03/1925 DOA: 08/26/2014  Referring physician:  PCP: Brooke BonitoGALLEMORE,WARREN, MD  Specialists:   Chief Complaint: More Confused Than Usual  HPI: Kaitlyn Burns is a 80 y.o. female with a history of DM2, and Hypothyroid who was sent from her ALF to the ED due to having more confusion than her usual since the afternoon.  She was evaluated in the ED and was found to have a + Troponin of 0.11, and elevation in her BUN/Cr as well as a  Metabolic acidosis  and a Heme Positive FOBT.   She was referred for medical admission.      Review of Systems:  Unable to Obtain from the Patient  Past Medical History  Diagnosis Date  . Diabetes mellitus   . Thyroid disease   . Complication of anesthesia     trouble intubating surgery  . Difficult intubation   . Arthritis       Past Surgical History  Procedure Laterality Date  . Abdominal hysterectomy    . Hemorroidectomy    . Medial partial knee replacement    . Thyroidectomy, partial    . Shoulder surgery Right     "had shredded a muscle"  . Achilles tendon surgery Left   . Foot surgery Left     left heel infection that went to bone per spouse  . Lower abdominel surgery         Prior to Admission medications   Medication Sig Start Date End Date Taking? Authorizing Provider  allopurinol (ZYLOPRIM) 100 MG tablet Take 100 mg by mouth 2 (two) times daily.   Yes Historical Provider, MD  Amino Acids-Protein Hydrolys (FEEDING SUPPLEMENT, PRO-STAT SUGAR FREE 64,) LIQD Take 30 mLs by mouth daily.   Yes Historical Provider, MD  beta carotene w/minerals (OCUVITE) tablet Take 1 tablet by mouth 2 (two) times daily.   Yes Historical Provider, MD  calcium-vitamin D (OSCAL WITH D) 500-200 MG-UNIT per tablet Take 1 tablet by mouth 2 (two) times daily.    Yes Historical Provider, MD  cholecalciferol (VITAMIN D) 1000 UNITS tablet Take 1,000 Units by mouth daily.   Yes  Historical Provider, MD  diltiazem (DILACOR XR) 240 MG 24 hr capsule Take 240 mg by mouth daily.   Yes Historical Provider, MD  divalproex (DEPAKOTE ER) 250 MG 24 hr tablet Take 250-500 mg by mouth 2 (two) times daily. 1 tablet in the am and 2 tablets in the pm   Yes Historical Provider, MD  feeding supplement (ENSURE CLINICAL STRENGTH) LIQD Take 237 mLs by mouth daily.   Yes Historical Provider, MD  ferrous sulfate 325 (65 FE) MG tablet Take 1 tablet (325 mg total) by mouth 3 (three) times daily with meals. Patient taking differently: Take 325 mg by mouth daily with breakfast.  09/13/13  Yes Penny Piarlando Vega, MD  hydrocortisone (ANUSOL-HC) 25 MG suppository Place 25 mg rectally 2 (two) times daily as needed for hemorrhoids.  09/09/13  Yes Historical Provider, MD  insulin glargine (LANTUS) 100 UNIT/ML injection Inject 8 Units into the skin at bedtime.   Yes Historical Provider, MD  latanoprost (XALATAN) 0.005 % ophthalmic solution Place 1 drop into both eyes at bedtime.   Yes Historical Provider, MD  levothyroxine (SYNTHROID, LEVOTHROID) 75 MCG tablet Take 75 mcg by mouth daily before breakfast.   Yes Historical Provider, MD  lisinopril (PRINIVIL,ZESTRIL) 2.5 MG tablet Take 2.5 mg by mouth daily.  Yes Historical Provider, MD  meclizine (ANTIVERT) 25 MG tablet Take 12.5 mg by mouth 2 (two) times daily as needed for dizziness.    Yes Historical Provider, MD  Melatonin 3 MG TABS Take 3 mg by mouth at bedtime.   Yes Historical Provider, MD  Menthol, Topical Analgesic, (BIOFREEZE) 4 % GEL Apply 1 application topically 4 (four) times daily - after meals and at bedtime.   Yes Historical Provider, MD  omeprazole (PRILOSEC) 20 MG capsule Take 20 mg by mouth daily.   Yes Historical Provider, MD  oxyCODONE-acetaminophen (PERCOCET/ROXICET) 5-325 MG per tablet Take 1 tablet by mouth every 4 (four) hours as needed for severe pain (pain).    Yes Historical Provider, MD  psyllium (REGULOID) 0.52 G capsule Take 0.52 g by  mouth at bedtime.   Yes Historical Provider, MD  traMADol (ULTRAM) 50 MG tablet Take by mouth 2 (two) times daily.   Yes Historical Provider, MD  traZODone (DESYREL) 50 MG tablet Take 25 mg by mouth at bedtime.   Yes Historical Provider, MD  venlafaxine XR (EFFEXOR-XR) 150 MG 24 hr capsule Take 150 mg by mouth daily with breakfast.   Yes Historical Provider, MD  fentaNYL (DURAGESIC - DOSED MCG/HR) 25 MCG/HR patch Place 25 mcg onto the skin every 3 (three) days.    Historical Provider, MD      Allergies  Allergen Reactions  . Gabapentin     unknown  . Lorazepam     unknown  . Metoclopramide     unknown  . Penicillins     unknown  . Tape     unknown  . Teflaro [Ceftaroline]     unknown     Social History:  reports that she has never smoked. She has never used smokeless tobacco. She reports that she drinks alcohol. She reports that she does not use illicit drugs.     History reviewed. No pertinent family history.     Physical Exam:  GEN:  Pleasant and Confused Thin  Elderly 80 y.o. Caucasian female examined and in no acute distress; cooperative with exam Filed Vitals:   08/27/14 0045 08/27/14 0100 08/27/14 0115 08/27/14 0130  BP: 92/37 105/39 113/40 121/45  Pulse: 82 82 82 84  Temp:    97.7 F (36.5 C)  TempSrc:    Oral  Resp: 17 15 18 18   Height:    5\' 1"  (1.549 m)  Weight:    60 kg (132 lb 4.4 oz)  SpO2: 93% 86% 98% 97%   Blood pressure 121/45, pulse 84, temperature 97.7 F (36.5 C), temperature source Oral, resp. rate 18, height 5\' 1"  (1.549 m), weight 60 kg (132 lb 4.4 oz), SpO2 97 %. PSYCH: SHe is alert and oriented x4; does not appear anxious does not appear depressed; affect is normal HEENT: Normocephalic and Atraumatic, Mucous membranes pink; PERRLA; EOM intact; Fundi:  Benign;  No scleral icterus, Nares: Patent, Oropharynx: Clear, Edentulous or Fair Dentition,    Neck:  FROM, No Cervical Lymphadenopathy nor Thyromegaly or Carotid Bruit; No JVD; Breasts:: Not  examined CHEST WALL: No tenderness CHEST: Normal respiration, clear to auscultation bilaterally HEART: Regular rate and rhythm; no murmurs rubs or gallops BACK: No kyphosis or scoliosis; No CVA tenderness ABDOMEN: Positive Bowel Sounds, Scaphoid,  Soft Non-Tender; No Masses, No Organomegaly. Rectal Exam: Not done EXTREMITIES: No Cyanosis, Clubbing, or Edema; No Ulcerations. Genitalia: not examined PULSES: 2+ and symmetric SKIN: Normal hydration no rash or ulceration CNS:  Axox1,  No Focal Deficits  Vascular: pulses palpable throughout    Labs on Admission:  Basic Metabolic Panel:  Recent Labs Lab 08/26/14 2134  NA 133*  K 3.6*  CL 95*  CO2 18*  GLUCOSE 117*  BUN 34*  CREATININE 2.98*  CALCIUM 9.5   Liver Function Tests:  Recent Labs Lab 08/26/14 2134  AST 28  ALT 26  ALKPHOS 152*  BILITOT 0.2*  PROT 6.3  ALBUMIN 2.9*   No results for input(s): LIPASE, AMYLASE in the last 168 hours. No results for input(s): AMMONIA in the last 168 hours. CBC:  Recent Labs Lab 08/26/14 2134 08/27/14 0223  WBC 18.0* 13.5*  NEUTROABS 13.9*  --   HGB 11.1* 11.3*  HCT 33.9* 35.5*  MCV 101.8* 102.9*  PLT 295 232   Cardiac Enzymes: No results for input(s): CKTOTAL, CKMB, CKMBINDEX, TROPONINI in the last 168 hours.  BNP (last 3 results) No results for input(s): PROBNP in the last 8760 hours. CBG:  Recent Labs Lab 08/26/14 2158  GLUCAP 113*    Radiological Exams on Admission: Ct Abdomen Pelvis Wo Contrast  08/26/2014   CLINICAL DATA:  Altered mental status, unable to void  EXAM: CT ABDOMEN AND PELVIS WITHOUT CONTRAST  TECHNIQUE: Multidetector CT imaging of the abdomen and pelvis was performed following the standard protocol without IV contrast.  COMPARISON:  01/28/2010  FINDINGS: Dependent bibasilar atelectasis noted.  Unenhanced liver, adrenal glands, spleen, and pancreas are unremarkable. Motion artifact degrades imaging. Exophytic right lower renal pole 0.8 cm probable  hyperdense cyst is identified, unchanged from the prior exam. Sub cm low-density bilateral renal cortical lesions elsewhere are reidentified but not further characterized on the current noncontrast exam. No free fluid or air. No lymphadenopathy.  Normal appendix, image 59. No bowel wall thickening or focal segmental dilatation is identified. Bladder is decompressed but unremarkable. Uterus not visualized. Ovaries are normal. Moderate atheromatous aortic calcification noted without aneurysm. Multilevel disc degenerative change noted in the spine.  IMPRESSION: No acute intra-abdominal or pelvic pathology.   Electronically Signed   By: Christiana PellantGretchen  Green M.D.   On: 08/26/2014 23:47   Ct Head Wo Contrast  08/26/2014   CLINICAL DATA:  Acute onset of confusion. Patient has not voided today. Initial encounter.  EXAM: CT HEAD WITHOUT CONTRAST  TECHNIQUE: Contiguous axial images were obtained from the base of the skull through the vertex without intravenous contrast.  COMPARISON:  CT of the head performed 01/17/2010  FINDINGS: There is no evidence of acute infarction, mass lesion, or intra- or extra-axial hemorrhage on CT.  Prominence of the ventricles and sulci reflects mild to moderate cortical volume loss. Cerebellar atrophy is noted. Mild periventricular and subcortical white matter change likely reflects small vessel ischemic microangiopathy.  The brainstem and fourth ventricle are within normal limits. The basal ganglia are unremarkable in appearance. The cerebral hemispheres demonstrate grossly normal gray-white differentiation. No mass effect or midline shift is seen.  There is no evidence of fracture; visualized osseous structures are unremarkable in appearance. The orbits are within normal limits. Dense inspissated mucus is noted within the right maxillary sinus. The remaining paranasal sinuses and mastoid air cells are well-aerated. No significant soft tissue abnormalities are seen.  IMPRESSION: 1. No acute  intracranial pathology seen on CT. 2. Mild to moderate cortical volume loss and scattered small vessel ischemic microangiopathy. 3. Dense inspissated mucus noted within the right maxillary sinus.   Electronically Signed   By: Roanna RaiderJeffery  Chang M.D.   On: 08/26/2014 23:46   Dg Chest Portable  1 View  08/26/2014   CLINICAL DATA:  Altered mental status, diabetes  EXAM: PORTABLE CHEST - 1 VIEW  COMPARISON:  None  FINDINGS: Heart size is within normal limits. The aorta is unfolded and ectatic. No focal pulmonary opacity. Evidence of arose origin or remote resection of the distal right clavicle. No acute osseous abnormality. No pleural effusion.  IMPRESSION: No acute cardiopulmonary process.   Electronically Signed   By: Christiana Pellant M.D.   On: 08/26/2014 22:37     EKG: Independently reviewed.    Assessment/Plan:   80 y.o. female with   Principal Problem:   1.   Rectal bleeding   Admit Hb = 11.7   Monitor H/Hs   Type and screen sent in event of needed Transfusion   IV Protonix        Active Problems:   2.   Acute encephalopathy- due to Metabolic causes   Monitor        3.   AKI (acute kidney injury)- due to Dehydration, CKD, and due to GI Bleeding   IVFs and    Monitor Trend of BUN/Cr      4.   Hypotension   IVFs      5.   NSTEMI (non-ST elevated myocardial infarction)/Elevated troponin   Discussed with cards, and advised to check CKMBs   Cycle Troponins   NO ASA due to #1   No Nitrates due to Hypotension     6.   Metabolic acidosis- due to Renal   Gentle IVFs        7.   Diabetes mellitus   SSI coverage PRN   Check HbA1C     8.   DVT Prophylaxis   SCDs      Code Status:     DO NOT RESUSCITATE (DNR)  Family Communication:    No Family Present Disposition Plan:   Inpatient    Stepdown UNit  Time spent:  45 Minutes  Ron Parker [facility] Pager [phone]   If 7AM -7PM Please Contact the Day Rounding Team MD for [facility]  If  7PM-7AM, Please Contact Night-Floor Coverage  [email or web address] Password [email or web address]

## 2014-08-27 NOTE — Consult Note (Signed)
Referring Provider: No ref. provider found Primary Care Physician:  Brooke BonitoGALLEMORE,WARREN, MD Primary Gastroenterologist:  None, unassigned  Reason for Consultation:  Melena, drop in Hgb  HPI: Kaitlyn Burns is a 80 y.o. female with PMH of DM, thyroid disease, HTN, and CKD stage 2-3 [facility]  Apparently she has a son who lives there with her and he reported a change in her baseline mental status, which is why she was brought to the hospital.  She was found to have AKI, elevated troponin, was relatively hypotensive, and was FOBT positive so was referred for admission.  Once again, GI was consulted for black heme positive stools x 2.  Per nursing staff, she has not moved her bowels/passed melena at all, so the black stools were noted on DRE.  Patient no reliable in history.  Hgb was 11.1 grams on admission and is 10.1 grams this morning after some   CT abdomen/pelvis without contrast showed no acute abnormalities.  So far AMS is being called multifactorial.   Of note, she had an admission in 09/2013 with significant anemia with Hgb of 4.8 grams and was given 3 units of PRBC's, but apparently never had any GI follow-up or procedures.   Past Medical History  Diagnosis Date  . Diabetes mellitus   . Thyroid disease   . Complication of anesthesia     trouble intubating surgery  . Difficult intubation   . Arthritis     Past Surgical History  Procedure Laterality Date  . Abdominal hysterectomy    . Hemorroidectomy    . Medial partial knee replacement    . Thyroidectomy, partial    . Shoulder surgery Right     "had shredded a muscle"  . Achilles tendon surgery Left   . Foot surgery Left     left heel infection that went to bone per spouse  . Lower abdominel surgery      Prior to Admission medications   Medication Sig Start Date End Date Taking? Authorizing Provider  allopurinol (ZYLOPRIM) 100 MG tablet Take 100 mg by mouth 2 (two) times daily.   Yes  Historical Provider, MD  Amino Acids-Protein Hydrolys (FEEDING SUPPLEMENT, PRO-STAT SUGAR FREE 64,) LIQD Take 30 mLs by mouth daily.   Yes Historical Provider, MD  beta carotene w/minerals (OCUVITE) tablet Take 1 tablet by mouth 2 (two) times daily.   Yes Historical Provider, MD  calcium-vitamin D (OSCAL WITH D) 500-200 MG-UNIT per tablet Take 1 tablet by mouth 2 (two) times daily.    Yes Historical Provider, MD  cholecalciferol (VITAMIN D) 1000 UNITS tablet Take 1,000 Units by mouth daily.   Yes Historical Provider, MD  diltiazem (DILACOR XR) 240 MG 24 hr capsule Take 240 mg by mouth daily.   Yes Historical Provider, MD  divalproex (DEPAKOTE ER) 250 MG 24 hr tablet Take 250-500 mg by mouth 2 (two) times daily. 1 tablet in the am and 2 tablets in the pm   Yes Historical Provider, MD  feeding supplement (ENSURE CLINICAL STRENGTH) LIQD Take 237 mLs by mouth daily.   Yes Historical Provider, MD  ferrous sulfate 325 (65 FE) MG tablet Take 1 tablet (325 mg total) by mouth 3 (three) times daily with meals. Patient taking differently: Take 325 mg by mouth daily with breakfast.  09/13/13  Yes Penny Piarlando Vega, MD  hydrocortisone (ANUSOL-HC) 25 MG suppository Place 25 mg rectally 2 (two) times daily as needed for hemorrhoids.  09/09/13  Yes Historical Provider, MD  insulin glargine (LANTUS) 100 UNIT/ML injection Inject 8 Units into the skin at bedtime.   Yes Historical Provider, MD  latanoprost (XALATAN) 0.005 % ophthalmic solution Place 1 drop into both eyes at bedtime.   Yes Historical Provider, MD  levothyroxine (SYNTHROID, LEVOTHROID) 75 MCG tablet Take 75 mcg by mouth daily before breakfast.   Yes Historical Provider, MD  lisinopril (PRINIVIL,ZESTRIL) 2.5 MG tablet Take 2.5 mg by mouth daily.   Yes Historical Provider, MD  meclizine (ANTIVERT) 25 MG tablet Take 12.5 mg by mouth 2 (two) times daily as needed for dizziness.    Yes Historical Provider, MD  Melatonin 3 MG TABS Take 3 mg by mouth at bedtime.   Yes  Historical Provider, MD  Menthol, Topical Analgesic, (BIOFREEZE) 4 % GEL Apply 1 application topically 4 (four) times daily - after meals and at bedtime.   Yes Historical Provider, MD  omeprazole (PRILOSEC) 20 MG capsule Take 20 mg by mouth daily.   Yes Historical Provider, MD  oxyCODONE-acetaminophen (PERCOCET/ROXICET) 5-325 MG per tablet Take 1 tablet by mouth every 4 (four) hours as needed for severe pain (pain).    Yes Historical Provider, MD  psyllium (REGULOID) 0.52 G capsule Take 0.52 g by mouth at bedtime.   Yes Historical Provider, MD  traMADol (ULTRAM) 50 MG tablet Take by mouth 2 (two) times daily.   Yes Historical Provider, MD  traZODone (DESYREL) 50 MG tablet Take 25 mg by mouth at bedtime.   Yes Historical Provider, MD  venlafaxine XR (EFFEXOR-XR) 150 MG 24 hr capsule Take 150 mg by mouth daily with breakfast.   Yes Historical Provider, MD  fentaNYL (DURAGESIC - DOSED MCG/HR) 25 MCG/HR patch Place 25 mcg onto the skin every 3 (three) days.    Historical Provider, MD    Current Facility-Administered Medications  Medication Dose Route Frequency Provider Last Rate Last Dose  . 0.9 %  sodium chloride infusion   Intravenous STAT Doug SouSam Jacubowitz, MD 100 mL/hr at 08/27/14 0700    . 0.9 %  sodium chloride infusion   Intravenous Continuous Ron ParkerHarvette C Jenkins, MD 175 mL/hr at 08/27/14 0800    . acetaminophen (TYLENOL) tablet 650 mg  650 mg Oral Q6H PRN Ron ParkerHarvette C Jenkins, MD       Or  . acetaminophen (TYLENOL) suppository 650 mg  650 mg Rectal Q6H PRN Ron ParkerHarvette C Jenkins, MD      . alum & mag hydroxide-simeth (MAALOX/MYLANTA) 200-200-20 MG/5ML suspension 30 mL  30 mL Oral Q6H PRN Ron ParkerHarvette C Jenkins, MD      . HYDROmorphone (DILAUDID) injection 0.5-1 mg  0.5-1 mg Intravenous Q3H PRN Ron ParkerHarvette C Jenkins, MD      . insulin aspart (novoLOG) injection 0-9 Units  0-9 Units Subcutaneous 6 times per day Ron ParkerHarvette C Jenkins, MD   0 Units at 08/27/14 0238  . [START ON 08/28/2014] levothyroxine (SYNTHROID,  LEVOTHROID) tablet 75 mcg  75 mcg Oral QAC breakfast Onalee Huaavid Tat, MD      . ondansetron (ZOFRAN) tablet 4 mg  4 mg Oral Q6H PRN Ron ParkerHarvette C Jenkins, MD       Or  . ondansetron (ZOFRAN) injection 4 mg  4 mg Intravenous Q6H PRN Harvette Velora Heckler Jenkins, MD      . oxyCODONE (Oxy IR/ROXICODONE) immediate release tablet 5 mg  5 mg Oral Q4H PRN Ron ParkerHarvette C Jenkins, MD      . pantoprazole (PROTONIX) injection 40 mg  40 mg Intravenous Q12H Ron ParkerHarvette C Jenkins, MD   40 mg at  08/27/14 0256    Allergies as of 08/26/2014 - Review Complete 08/26/2014  Allergen Reaction Noted  . Gabapentin  09/12/2013  . Lorazepam  09/12/2013  . Metoclopramide  09/12/2013  . Penicillins  09/12/2013  . Tape  09/12/2013  . Teflaro [ceftaroline]  09/12/2013    History reviewed. No pertinent family history.  History   Social History  . Marital Status: Married    Spouse Name: N/A    Number of Children: N/A  . Years of Education: N/A   Occupational History  . Not on file.   Social History Main Topics  . Smoking status: Never Smoker   . Smokeless tobacco: Never Used  . Alcohol Use: Yes     Comment: red wine with evening meal  . Drug Use: No  . Sexual Activity: Not on file   Other Topics Concern  . Not on file   Social History Narrative    Review of Systems: Ten point ROS is O/W negative except as mentioned in HPI.  Physical Exam: Vital signs in last 24 hours: Temp:  [97.6 F (36.4 C)-98.1 F (36.7 C)] 97.6 F (36.4 C) (12/21 0800) Pulse Rate:  [73-84] 74 (12/21 0800) Resp:  [11-27] 22 (12/21 0800) BP: (92-122)/(30-54) 107/44 mmHg (12/21 0800) SpO2:  [86 %-100 %] 94 % (12/21 0800) Weight:  [132 lb 4.4 oz (60 kg)] 132 lb 4.4 oz (60 kg) (12/21 0130) Last BM Date: 08/27/14 General:   Alert, elderly and frail, pleasant and cooperative in NAD Head:  Normocephalic and atraumatic. Eyes:  Sclera clear, no icterus.  Conjunctiva pink. Ears:  Normal auditory acuity. Mouth:  No deformity or lesions.   Lungs:   Clear throughout to auscultation.  No wheezes, crackles, or rhonchi.  Heart:  Regular rate and rhythm Abdomen:  Soft, non-distended.  BS present.  Mild left sided TTP without R/R/G.   Rectal:  Deferred.  Reported black stool that were heme negative x 2.  Msk:  Symmetrical without gross deformities. Pulses:  Normal pulses noted. Extremities:  Without clubbing or edema. Neurologic:  Grossly normal neurologically.  Confused and speech difficult to understand. Skin:  Intact without significant lesions or rashes.  Intake/Output from previous day: 12/20 0701 - 12/21 0700 In: 648.3 [I.V.:648.3] Out: -   Lab Results:  Recent Labs  08/26/14 2134 08/27/14 0223 08/27/14 0755  WBC 18.0* 13.5*  --   HGB 11.1* 11.3* 10.1*  HCT 33.9* 35.5* 31.5*  PLT 295 232  --    BMET  Recent Labs  08/26/14 2134 08/27/14 0223  NA 133* 136*  K 3.6* 3.9  CL 95* 103  CO2 18* 16*  GLUCOSE 117* 95  BUN 34* 31*  CREATININE 2.98* 2.56*  CALCIUM 9.5 8.3*   LFT  Recent Labs  08/26/14 2134  PROT 6.3  ALBUMIN 2.9*  AST 28  ALT 26  ALKPHOS 152*  BILITOT 0.2*   Studies/Results: Ct Abdomen Pelvis Wo Contrast  08/26/2014   CLINICAL DATA:  Altered mental status, unable to void  EXAM: CT ABDOMEN AND PELVIS WITHOUT CONTRAST  TECHNIQUE: Multidetector CT imaging of the abdomen and pelvis was performed following the standard protocol without IV contrast.  COMPARISON:  01/28/2010  FINDINGS: Dependent bibasilar atelectasis noted.  Unenhanced liver, adrenal glands, spleen, and pancreas are unremarkable. Motion artifact degrades imaging. Exophytic right lower renal pole 0.8 cm probable hyperdense cyst is identified, unchanged from the prior exam. Sub cm low-density bilateral renal cortical lesions elsewhere are reidentified but not further characterized on the  current noncontrast exam. No free fluid or air. No lymphadenopathy.  Normal appendix, image 59. No bowel wall thickening or focal segmental dilatation is  identified. Bladder is decompressed but unremarkable. Uterus not visualized. Ovaries are normal. Moderate atheromatous aortic calcification noted without aneurysm. Multilevel disc degenerative change noted in the spine.  IMPRESSION: No acute intra-abdominal or pelvic pathology.   Electronically Signed   By: Christiana Pellant M.D.   On: 08/26/2014 23:47   Ct Head Wo Contrast  08/26/2014   CLINICAL DATA:  Acute onset of confusion. Patient has not voided today. Initial encounter.  EXAM: CT HEAD WITHOUT CONTRAST  TECHNIQUE: Contiguous axial images were obtained from the base of the skull through the vertex without intravenous contrast.  COMPARISON:  CT of the head performed 01/17/2010  FINDINGS: There is no evidence of acute infarction, mass lesion, or intra- or extra-axial hemorrhage on CT.  Prominence of the ventricles and sulci reflects mild to moderate cortical volume loss. Cerebellar atrophy is noted. Mild periventricular and subcortical white matter change likely reflects small vessel ischemic microangiopathy.  The brainstem and fourth ventricle are within normal limits. The basal ganglia are unremarkable in appearance. The cerebral hemispheres demonstrate grossly normal gray-white differentiation. No mass effect or midline shift is seen.  There is no evidence of fracture; visualized osseous structures are unremarkable in appearance. The orbits are within normal limits. Dense inspissated mucus is noted within the right maxillary sinus. The remaining paranasal sinuses and mastoid air cells are well-aerated. No significant soft tissue abnormalities are seen.  IMPRESSION: 1. No acute intracranial pathology seen on CT. 2. Mild to moderate cortical volume loss and scattered small vessel ischemic microangiopathy. 3. Dense inspissated mucus noted within the right maxillary sinus.   Electronically Signed   By: Roanna Raider M.D.   On: 08/26/2014 23:46   Dg Chest Portable 1 View  08/26/2014   CLINICAL DATA:  Altered  mental status, diabetes  EXAM: PORTABLE CHEST - 1 VIEW  COMPARISON:  None  FINDINGS: Heart size is within normal limits. The aorta is unfolded and ectatic. No focal pulmonary opacity. Evidence of arose origin or remote resection of the distal right clavicle. No acute osseous abnormality. No pleural effusion.  IMPRESSION: No acute cardiopulmonary process.   Electronically Signed   By: Christiana Pellant M.D.   On: 08/26/2014 22:37    IMPRESSION:  -80 year old female with reported black, FOBT positive x 2 since admission (on rectal exams but has not passed any melena per nursing staff), and history of severe anemia in 09/2013 with Hgb of 4.8 grams requiring 3 units of PRBC's.  Hbg 11.1 grams on this admission and 10.1 grams today after some hydration. -Altered mental status/acute encephalopathy:  Multifactorial due to anemia, AKI, dehydration.  PLAN: -She is on protonix 40 mg IV BID, which should be continued. -Should likely have EGD for evaluation during hospitalization, but patient cannot give consent so I did contact her sone/POA, Rodman Pickle, and consent was obtained via phone.  Will proceed with EGD on 12/22 at 8:15 am. -Monitor Hgb and for any signs of active bleeding. -Continue evaluation and treatment for altered mental status.  ZEHR, JESSICA D.  08/27/2014, 9:36 AM  Pager number [phone]  GI Attending Note   Chart was reviewed and patient was examined. X-rays and lab were reviewed.   Patient clearly has had some GI bleeding although I'm uncertain whether this is the etiology for mental status changes.  She has renal insufficiency she does not  appear to be actively bleeding.  Agree with plans for high-dose Protonix.  Will tentatively plan for EGD in the a.m. provided that she is medically stable.  Barbette Hair. Arlyce Dice, M.D., [facility] [phone]

## 2014-08-27 NOTE — ED Notes (Signed)
Pt's son set up password Deatra Cantermerson so that he could call and check up on his mother.  Please give son or his wife information about pt if they can tell you this password.

## 2014-08-27 NOTE — Progress Notes (Signed)
NA did not report any urine output and it was not charted so I performed a bladder scan which revealed <350cc of urine. Text paged the MD because this was the second time since the pt came in through the ED that she has been scanned and retaining more than 350cc of urine. Foley cath to be placed. Will continue to monitor.

## 2014-08-28 ENCOUNTER — Encounter (HOSPITAL_COMMUNITY): Payer: Self-pay | Admitting: Certified Registered Nurse Anesthetist

## 2014-08-28 ENCOUNTER — Inpatient Hospital Stay (HOSPITAL_COMMUNITY): Payer: Medicare Other | Admitting: Certified Registered Nurse Anesthetist

## 2014-08-28 ENCOUNTER — Encounter (HOSPITAL_COMMUNITY)
Admission: EM | Disposition: A | Discharge status: Skilled Nursing Facility | Payer: Self-pay | Source: Home / Self Care | Attending: Internal Medicine

## 2014-08-28 DIAGNOSIS — K625 Hemorrhage of anus and rectum: Secondary | ICD-10-CM

## 2014-08-28 HISTORY — PX: ESOPHAGOGASTRODUODENOSCOPY (EGD) WITH PROPOFOL: SHX5813

## 2014-08-28 LAB — CK TOTAL AND CKMB (NOT AT ARMC)
CK TOTAL: 35 U/L (ref 7–177)
CK, MB: 12.2 ng/mL (ref 0.3–4.0)
CK, MB: 12.6 ng/mL — AB (ref 0.3–4.0)
CK, MB: 10.8 ng/mL (ref 0.3–4.0)
RELATIVE INDEX: INVALID (ref 0.0–2.5)
Relative Index: INVALID (ref 0.0–2.5)
Relative Index: INVALID (ref 0.0–2.5)
Total CK: 49 U/L (ref 7–177)
Total CK: 44 U/L (ref 7–177)

## 2014-08-28 LAB — GLUCOSE, CAPILLARY
GLUCOSE-CAPILLARY: 73 mg/dL (ref 70–99)
GLUCOSE-CAPILLARY: 74 mg/dL (ref 70–99)
Glucose-Capillary: 76 mg/dL (ref 70–99)
Glucose-Capillary: 71 mg/dL (ref 70–99)
Glucose-Capillary: 57 mg/dL — ABNORMAL LOW (ref 70–99)
Glucose-Capillary: 152 mg/dL — ABNORMAL HIGH (ref 70–99)
Glucose-Capillary: 117 mg/dL — ABNORMAL HIGH (ref 70–99)
Glucose-Capillary: 105 mg/dL — ABNORMAL HIGH (ref 70–99)

## 2014-08-28 LAB — CBC
HCT: 31.2 % — ABNORMAL LOW (ref 36.0–46.0)
Hemoglobin: 10.1 g/dL — ABNORMAL LOW (ref 12.0–15.0)
MCH: 33 pg (ref 26.0–34.0)
MCHC: 32.4 g/dL (ref 30.0–36.0)
MCV: 102 fL — ABNORMAL HIGH (ref 78.0–100.0)
PLATELETS: 184 10*3/uL (ref 150–400)
RBC: 3.06 MIL/uL — AB (ref 3.87–5.11)
RDW: 14.9 % (ref 11.5–15.5)
WBC: 9.9 10*3/uL (ref 4.0–10.5)

## 2014-08-28 LAB — HEMOGLOBIN AND HEMATOCRIT, BLOOD
HCT: 30.6 % — ABNORMAL LOW (ref 36.0–46.0)
HEMATOCRIT: 29.6 % — AB (ref 36.0–46.0)
Hemoglobin: 9.8 g/dL — ABNORMAL LOW (ref 12.0–15.0)
Hemoglobin: 9.7 g/dL — ABNORMAL LOW (ref 12.0–15.0)

## 2014-08-28 LAB — BASIC METABOLIC PANEL
Anion gap: 9 (ref 5–15)
BUN: 30 mg/dL — ABNORMAL HIGH (ref 6–23)
CO2: 15 mmol/L — ABNORMAL LOW (ref 19–32)
Calcium: 7.8 mg/dL — ABNORMAL LOW (ref 8.4–10.5)
Chloride: 115 mEq/L — ABNORMAL HIGH (ref 96–112)
Creatinine, Ser: 1.65 mg/dL — ABNORMAL HIGH (ref 0.50–1.10)
GFR, EST AFRICAN AMERICAN: 31 mL/min — AB (ref >90)
GFR, EST NON AFRICAN AMERICAN: 27 mL/min — AB (ref >90)
Glucose, Bld: 70 mg/dL (ref 70–99)
POTASSIUM: 3.2 mmol/L — AB (ref 3.5–5.1)
SODIUM: 139 mmol/L (ref 135–145)

## 2014-08-28 LAB — AMMONIA: Ammonia: 50 umol/L — ABNORMAL HIGH (ref 11–32)

## 2014-08-28 SURGERY — ESOPHAGOGASTRODUODENOSCOPY (EGD) WITH PROPOFOL
Anesthesia: Monitor Anesthesia Care

## 2014-08-28 MED ORDER — THIAMINE HCL 100 MG/ML IJ SOLN: 100.0000 mg | Freq: Every day | INTRAMUSCULAR | Status: DC

## 2014-08-28 MED ORDER — VITAMIN B-1 100 MG PO TABS: 100.0000 mg | ORAL_TABLET | Freq: Every day | ORAL | Status: DC

## 2014-08-28 MED ORDER — THIAMINE HCL 100 MG/ML IJ SOLN
500.0000 mg | Freq: Three times a day (TID) | INTRAVENOUS | Status: AC
  Administered 2014-08-28 – 2014-08-30 (×5): 500 mg via INTRAVENOUS
  Filled 2014-08-28 (×6): qty 5

## 2014-08-28 MED ORDER — PROPOFOL 10 MG/ML IV BOLUS
INTRAVENOUS | Status: AC
  Filled 2014-08-28: qty 20

## 2014-08-28 MED ORDER — ADULT MULTIVITAMIN W/MINERALS CH
1.0000 | ORAL_TABLET | Freq: Every day | ORAL | Status: DC
  Administered 2014-08-30 – 2014-09-04 (×4): 1 via ORAL
  Filled 2014-08-28 (×6): qty 1

## 2014-08-28 MED ORDER — LORAZEPAM 2 MG/ML IJ SOLN
1.0000 mg | Freq: Four times a day (QID) | INTRAMUSCULAR | Status: AC | PRN
  Administered 2014-08-28 – 2014-08-31 (×4): 1 mg via INTRAVENOUS
  Filled 2014-08-28 (×4): qty 1

## 2014-08-28 MED ORDER — POTASSIUM CHLORIDE 10 MEQ/100ML IV SOLN
10.0000 meq | INTRAVENOUS | Status: AC
  Administered 2014-08-28 (×2): 10 meq via INTRAVENOUS
  Filled 2014-08-28: qty 100

## 2014-08-28 MED ORDER — PROPOFOL 10 MG/ML IV BOLUS
INTRAVENOUS | Status: DC | PRN
  Administered 2014-08-28 (×2): 20 mg via INTRAVENOUS

## 2014-08-28 MED ORDER — LORAZEPAM 1 MG PO TABS: 1.0000 mg | ORAL_TABLET | Freq: Four times a day (QID) | ORAL | Status: AC | PRN

## 2014-08-28 MED ORDER — LACTATED RINGERS IV SOLN
INTRAVENOUS | Status: DC | PRN
  Administered 2014-08-28: 08:00:00 via INTRAVENOUS

## 2014-08-28 MED ORDER — PHENYLEPHRINE HCL 10 MG/ML IJ SOLN
INTRAMUSCULAR | Status: AC
  Filled 2014-08-28: qty 1

## 2014-08-28 MED ORDER — PEG-KCL-NACL-NASULF-NA ASC-C 100 G PO SOLR
0.5000 | Freq: Two times a day (BID) | ORAL | Status: AC
  Administered 2014-08-28 – 2014-08-29 (×2): 100 g via ORAL
  Filled 2014-08-28: qty 1

## 2014-08-28 MED ORDER — DEXTROSE 50 % IV SOLN
INTRAVENOUS | Status: AC
  Administered 2014-08-28: 50 mL
  Filled 2014-08-28: qty 50

## 2014-08-28 MED ORDER — FOLIC ACID 1 MG PO TABS
1.0000 mg | ORAL_TABLET | Freq: Every day | ORAL | Status: DC
  Administered 2014-08-30 – 2014-09-04 (×5): 1 mg via ORAL
  Filled 2014-08-28 (×6): qty 1

## 2014-08-28 SURGICAL SUPPLY — 14 items

## 2014-08-28 NOTE — Progress Notes (Signed)
Hypoglycemic Event  CBG: 57 @ 1027  Treatment: D50 IV 50 mL at 1100  Symptoms: None  Follow-up CBG: Time:1200 CBG Result:151  Possible Reasons for Event: Inadequate meal intake  Comments/MD notified: continue to monitor     Talmage Naponrad, Shone Leventhal Wyrick  Remember to initiate Hypoglycemia Order Set & complete

## 2014-08-28 NOTE — H&P (View-Only) (Signed)
Referring Provider: No ref. provider found Primary Care Physician:  Brooke BonitoGALLEMORE,WARREN, MD Primary Gastroenterologist:  None, unassigned  Reason for Consultation:  Melena, drop in Hgb  HPI: Kaitlyn GheeRobye Burns is a 80 y.o. female with PMH of DM, thyroid disease, HTN, and CKD stage 2-3 [facility].  Apparently she has a son who lives there with her and he reported a change in her baseline mental status, which is why she was brought to the hospital.  She was found to have AKI, elevated troponin, was relatively hypotensive, and was FOBT positive so was referred for admission.  Once again, GI was consulted for black heme positive stools x 2.  Per nursing staff, she has not moved her bowels/passed melena at all, so the black stools were noted on DRE.  Patient no reliable in history.  Hgb was 11.1 grams on admission and is 10.1 grams this morning after some   CT abdomen/pelvis without contrast showed no acute abnormalities.  So far AMS is being called multifactorial.   Of note, she had an admission in 09/2013 with significant anemia with Hgb of 4.8 grams and was given 3 units of PRBC's, but apparently never had any GI follow-up or procedures.   Past Medical History  Diagnosis Date  . Diabetes mellitus   . Thyroid disease   . Complication of anesthesia     trouble intubating surgery  . Difficult intubation   . Arthritis     Past Surgical History  Procedure Laterality Date  . Abdominal hysterectomy    . Hemorroidectomy    . Medial partial knee replacement    . Thyroidectomy, partial    . Shoulder surgery Right     "had shredded a muscle"  . Achilles tendon surgery Left   . Foot surgery Left     left heel infection that went to bone per spouse  . Lower abdominel surgery      Prior to Admission medications   Medication Sig Start Date End Date Taking? Authorizing Provider  allopurinol (ZYLOPRIM) 100 MG tablet Take 100 mg by mouth 2 (two) times daily.   Yes  Historical Provider, MD  Amino Acids-Protein Hydrolys (FEEDING SUPPLEMENT, PRO-STAT SUGAR FREE 64,) LIQD Take 30 mLs by mouth daily.   Yes Historical Provider, MD  beta carotene w/minerals (OCUVITE) tablet Take 1 tablet by mouth 2 (two) times daily.   Yes Historical Provider, MD  calcium-vitamin D (OSCAL WITH D) 500-200 MG-UNIT per tablet Take 1 tablet by mouth 2 (two) times daily.    Yes Historical Provider, MD  cholecalciferol (VITAMIN D) 1000 UNITS tablet Take 1,000 Units by mouth daily.   Yes Historical Provider, MD  diltiazem (DILACOR XR) 240 MG 24 hr capsule Take 240 mg by mouth daily.   Yes Historical Provider, MD  divalproex (DEPAKOTE ER) 250 MG 24 hr tablet Take 250-500 mg by mouth 2 (two) times daily. 1 tablet in the am and 2 tablets in the pm   Yes Historical Provider, MD  feeding supplement (ENSURE CLINICAL STRENGTH) LIQD Take 237 mLs by mouth daily.   Yes Historical Provider, MD  ferrous sulfate 325 (65 FE) MG tablet Take 1 tablet (325 mg total) by mouth 3 (three) times daily with meals. Patient taking differently: Take 325 mg by mouth daily with breakfast.  09/13/13  Yes Penny Piarlando Vega, MD  hydrocortisone (ANUSOL-HC) 25 MG suppository Place 25 mg rectally 2 (two) times daily as needed for hemorrhoids.  09/09/13  Yes Historical Provider, MD  insulin glargine (LANTUS) 100 UNIT/ML injection Inject 8 Units into the skin at bedtime.   Yes Historical Provider, MD  latanoprost (XALATAN) 0.005 % ophthalmic solution Place 1 drop into both eyes at bedtime.   Yes Historical Provider, MD  levothyroxine (SYNTHROID, LEVOTHROID) 75 MCG tablet Take 75 mcg by mouth daily before breakfast.   Yes Historical Provider, MD  lisinopril (PRINIVIL,ZESTRIL) 2.5 MG tablet Take 2.5 mg by mouth daily.   Yes Historical Provider, MD  meclizine (ANTIVERT) 25 MG tablet Take 12.5 mg by mouth 2 (two) times daily as needed for dizziness.    Yes Historical Provider, MD  Melatonin 3 MG TABS Take 3 mg by mouth at bedtime.   Yes  Historical Provider, MD  Menthol, Topical Analgesic, (BIOFREEZE) 4 % GEL Apply 1 application topically 4 (four) times daily - after meals and at bedtime.   Yes Historical Provider, MD  omeprazole (PRILOSEC) 20 MG capsule Take 20 mg by mouth daily.   Yes Historical Provider, MD  oxyCODONE-acetaminophen (PERCOCET/ROXICET) 5-325 MG per tablet Take 1 tablet by mouth every 4 (four) hours as needed for severe pain (pain).    Yes Historical Provider, MD  psyllium (REGULOID) 0.52 G capsule Take 0.52 g by mouth at bedtime.   Yes Historical Provider, MD  traMADol (ULTRAM) 50 MG tablet Take by mouth 2 (two) times daily.   Yes Historical Provider, MD  traZODone (DESYREL) 50 MG tablet Take 25 mg by mouth at bedtime.   Yes Historical Provider, MD  venlafaxine XR (EFFEXOR-XR) 150 MG 24 hr capsule Take 150 mg by mouth daily with breakfast.   Yes Historical Provider, MD  fentaNYL (DURAGESIC - DOSED MCG/HR) 25 MCG/HR patch Place 25 mcg onto the skin every 3 (three) days.    Historical Provider, MD    Current Facility-Administered Medications  Medication Dose Route Frequency Provider Last Rate Last Dose  . 0.9 %  sodium chloride infusion   Intravenous STAT Doug SouSam Jacubowitz, MD 100 mL/hr at 08/27/14 0700    . 0.9 %  sodium chloride infusion   Intravenous Continuous Ron ParkerHarvette C Jenkins, MD 175 mL/hr at 08/27/14 0800    . acetaminophen (TYLENOL) tablet 650 mg  650 mg Oral Q6H PRN Ron ParkerHarvette C Jenkins, MD       Or  . acetaminophen (TYLENOL) suppository 650 mg  650 mg Rectal Q6H PRN Ron ParkerHarvette C Jenkins, MD      . alum & mag hydroxide-simeth (MAALOX/MYLANTA) 200-200-20 MG/5ML suspension 30 mL  30 mL Oral Q6H PRN Ron ParkerHarvette C Jenkins, MD      . HYDROmorphone (DILAUDID) injection 0.5-1 mg  0.5-1 mg Intravenous Q3H PRN Ron ParkerHarvette C Jenkins, MD      . insulin aspart (novoLOG) injection 0-9 Units  0-9 Units Subcutaneous 6 times per day Ron ParkerHarvette C Jenkins, MD   0 Units at 08/27/14 0238  . [START ON 08/28/2014] levothyroxine (SYNTHROID,  LEVOTHROID) tablet 75 mcg  75 mcg Oral QAC breakfast Onalee Huaavid Tat, MD      . ondansetron (ZOFRAN) tablet 4 mg  4 mg Oral Q6H PRN Ron ParkerHarvette C Jenkins, MD       Or  . ondansetron (ZOFRAN) injection 4 mg  4 mg Intravenous Q6H PRN Harvette Velora Heckler Jenkins, MD      . oxyCODONE (Oxy IR/ROXICODONE) immediate release tablet 5 mg  5 mg Oral Q4H PRN Ron ParkerHarvette C Jenkins, MD      . pantoprazole (PROTONIX) injection 40 mg  40 mg Intravenous Q12H Ron ParkerHarvette C Jenkins, MD   40 mg at  08/27/14 0256    Allergies as of 08/26/2014 - Review Complete 08/26/2014  Allergen Reaction Noted  . Gabapentin  09/12/2013  . Lorazepam  09/12/2013  . Metoclopramide  09/12/2013  . Penicillins  09/12/2013  . Tape  09/12/2013  . Teflaro [ceftaroline]  09/12/2013    History reviewed. No pertinent family history.  History   Social History  . Marital Status: Married    Spouse Name: N/A    Number of Children: N/A  . Years of Education: N/A   Occupational History  . Not on file.   Social History Main Topics  . Smoking status: Never Smoker   . Smokeless tobacco: Never Used  . Alcohol Use: Yes     Comment: red wine with evening meal  . Drug Use: No  . Sexual Activity: Not on file   Other Topics Concern  . Not on file   Social History Narrative    Review of Systems: Ten point ROS is O/W negative except as mentioned in HPI.  Physical Exam: Vital signs in last 24 hours: Temp:  [97.6 F (36.4 C)-98.1 F (36.7 C)] 97.6 F (36.4 C) (12/21 0800) Pulse Rate:  [73-84] 74 (12/21 0800) Resp:  [11-27] 22 (12/21 0800) BP: (92-122)/(30-54) 107/44 mmHg (12/21 0800) SpO2:  [86 %-100 %] 94 % (12/21 0800) Weight:  [132 lb 4.4 oz (60 kg)] 132 lb 4.4 oz (60 kg) (12/21 0130) Last BM Date: 08/27/14 General:   Alert, elderly and frail, pleasant and cooperative in NAD Head:  Normocephalic and atraumatic. Eyes:  Sclera clear, no icterus.  Conjunctiva pink. Ears:  Normal auditory acuity. Mouth:  No deformity or lesions.   Lungs:   Clear throughout to auscultation.  No wheezes, crackles, or rhonchi.  Heart:  Regular rate and rhythm Abdomen:  Soft, non-distended.  BS present.  Mild left sided TTP without R/R/G.   Rectal:  Deferred.  Reported black stool that were heme negative x 2.  Msk:  Symmetrical without gross deformities. Pulses:  Normal pulses noted. Extremities:  Without clubbing or edema. Neurologic:  Grossly normal neurologically.  Confused and speech difficult to understand. Skin:  Intact without significant lesions or rashes.  Intake/Output from previous day: 12/20 0701 - 12/21 0700 In: 648.3 [I.V.:648.3] Out: -   Lab Results:  Recent Labs  08/26/14 2134 08/27/14 0223 08/27/14 0755  WBC 18.0* 13.5*  --   HGB 11.1* 11.3* 10.1*  HCT 33.9* 35.5* 31.5*  PLT 295 232  --    BMET  Recent Labs  08/26/14 2134 08/27/14 0223  NA 133* 136*  K 3.6* 3.9  CL 95* 103  CO2 18* 16*  GLUCOSE 117* 95  BUN 34* 31*  CREATININE 2.98* 2.56*  CALCIUM 9.5 8.3*   LFT  Recent Labs  08/26/14 2134  PROT 6.3  ALBUMIN 2.9*  AST 28  ALT 26  ALKPHOS 152*  BILITOT 0.2*   Studies/Results: Ct Abdomen Pelvis Wo Contrast  08/26/2014   CLINICAL DATA:  Altered mental status, unable to void  EXAM: CT ABDOMEN AND PELVIS WITHOUT CONTRAST  TECHNIQUE: Multidetector CT imaging of the abdomen and pelvis was performed following the standard protocol without IV contrast.  COMPARISON:  01/28/2010  FINDINGS: Dependent bibasilar atelectasis noted.  Unenhanced liver, adrenal glands, spleen, and pancreas are unremarkable. Motion artifact degrades imaging. Exophytic right lower renal pole 0.8 cm probable hyperdense cyst is identified, unchanged from the prior exam. Sub cm low-density bilateral renal cortical lesions elsewhere are reidentified but not further characterized on the  current noncontrast exam. No free fluid or air. No lymphadenopathy.  Normal appendix, image 59. No bowel wall thickening or focal segmental dilatation is  identified. Bladder is decompressed but unremarkable. Uterus not visualized. Ovaries are normal. Moderate atheromatous aortic calcification noted without aneurysm. Multilevel disc degenerative change noted in the spine.  IMPRESSION: No acute intra-abdominal or pelvic pathology.   Electronically Signed   By: Christiana Pellant M.D.   On: 08/26/2014 23:47   Ct Head Wo Contrast  08/26/2014   CLINICAL DATA:  Acute onset of confusion. Patient has not voided today. Initial encounter.  EXAM: CT HEAD WITHOUT CONTRAST  TECHNIQUE: Contiguous axial images were obtained from the base of the skull through the vertex without intravenous contrast.  COMPARISON:  CT of the head performed 01/17/2010  FINDINGS: There is no evidence of acute infarction, mass lesion, or intra- or extra-axial hemorrhage on CT.  Prominence of the ventricles and sulci reflects mild to moderate cortical volume loss. Cerebellar atrophy is noted. Mild periventricular and subcortical white matter change likely reflects small vessel ischemic microangiopathy.  The brainstem and fourth ventricle are within normal limits. The basal ganglia are unremarkable in appearance. The cerebral hemispheres demonstrate grossly normal gray-white differentiation. No mass effect or midline shift is seen.  There is no evidence of fracture; visualized osseous structures are unremarkable in appearance. The orbits are within normal limits. Dense inspissated mucus is noted within the right maxillary sinus. The remaining paranasal sinuses and mastoid air cells are well-aerated. No significant soft tissue abnormalities are seen.  IMPRESSION: 1. No acute intracranial pathology seen on CT. 2. Mild to moderate cortical volume loss and scattered small vessel ischemic microangiopathy. 3. Dense inspissated mucus noted within the right maxillary sinus.   Electronically Signed   By: Roanna Raider M.D.   On: 08/26/2014 23:46   Dg Chest Portable 1 View  08/26/2014   CLINICAL DATA:  Altered  mental status, diabetes  EXAM: PORTABLE CHEST - 1 VIEW  COMPARISON:  None  FINDINGS: Heart size is within normal limits. The aorta is unfolded and ectatic. No focal pulmonary opacity. Evidence of arose origin or remote resection of the distal right clavicle. No acute osseous abnormality. No pleural effusion.  IMPRESSION: No acute cardiopulmonary process.   Electronically Signed   By: Christiana Pellant M.D.   On: 08/26/2014 22:37    IMPRESSION:  -80 year old female with reported black, FOBT positive x 2 since admission (on rectal exams but has not passed any melena per nursing staff), and history of severe anemia in 09/2013 with Hgb of 4.8 grams requiring 3 units of PRBC's.  Hbg 11.1 grams on this admission and 10.1 grams today after some hydration. -Altered mental status/acute encephalopathy:  Multifactorial due to anemia, AKI, dehydration.  PLAN: -She is on protonix 40 mg IV BID, which should be continued. -Should likely have EGD for evaluation during hospitalization, but patient cannot give consent so I did contact her sone/POA, Rodman Pickle, and consent was obtained via phone.  Will proceed with EGD on 12/22 at 8:15 am. -Monitor Hgb and for any signs of active bleeding. -Continue evaluation and treatment for altered mental status.  ZEHR, JESSICA D.  08/27/2014, 9:36 AM  Pager number [phone]  GI Attending Note   Chart was reviewed and patient was examined. X-rays and lab were reviewed.   Patient clearly has had some GI bleeding although I'm uncertain whether this is the etiology for mental status changes.  She has renal insufficiency she does not  appear to be actively bleeding.  Agree with plans for high-dose Protonix.  Will tentatively plan for EGD in the a.m. provided that she is medically stable.  Barbette Hair. Arlyce Dice, M.D., [facility] [phone]

## 2014-08-28 NOTE — Transfer of Care (Signed)
Immediate Anesthesia Transfer of Care Note  Patient: Kaitlyn Burns  Procedure(s) Performed: Procedure(s): ESOPHAGOGASTRODUODENOSCOPY (EGD) WITH PROPOFOL (N/A)  Patient Location: PACU and Endoscopy Unit  Anesthesia Type:MAC  Level of Consciousness: awake, confused, lethargic and responds to stimulation  Airway & Oxygen Therapy: Patient Spontanous Breathing and Patient connected to nasal cannula oxygen  Post-op Assessment: Report given to PACU RN, Post -op Vital signs reviewed and stable and Patient moving all extremities  Post vital signs: Reviewed and stable  Complications: No apparent anesthesia complications

## 2014-08-28 NOTE — Progress Notes (Signed)
CSW received referral that pt admitted [facility].   CSW attempted to visit pt room, but pt receiving nursing care at this time.  CSW to follow up and complete psychosocial assessment.  Pt will need PT/OT consults when stable to participate as pt is from ALF.   CSW to continue to follow.  Loletta SpecterSuzanna Kidd, MSW, LCSW Clinical Social Work [phone]

## 2014-08-28 NOTE — Interval H&P Note (Signed)
History and Physical Interval Note:  08/28/2014 8:38 AM  Kaitlyn Burns  has presented today for surgery, with the diagnosis of Black, heme positive stools; history of anemia  The various methods of treatment have been discussed with the patient and family. After consideration of risks, benefits and other options for treatment, the patient has consented to  Procedure(s): ESOPHAGOGASTRODUODENOSCOPY (EGD) WITH PROPOFOL (N/A) as a surgical intervention .  The patient's history has been reviewed, patient examined, no change in status, stable for surgery.  I have reviewed the patient's chart and labs.  Questions were answered to the patient's satisfaction.    The recent H&P (dated *08/27/14**) was reviewed, the patient was examined and there is no change in the patients condition since that H&P was completed.   Kaitlyn HeapsRobert Burns  08/28/2014, 8:38 AM    Kaitlyn Burns

## 2014-08-28 NOTE — Anesthesia Postprocedure Evaluation (Signed)
Anesthesia Post Note  Patient: Kaitlyn Burns  Procedure(s) Performed: Procedure(s) (LRB): ESOPHAGOGASTRODUODENOSCOPY (EGD) WITH PROPOFOL (N/A)  Anesthesia type: MAC  Patient location: PACU  Post pain: Pain level controlled  Post assessment: Post-op Vital signs reviewed  Last Vitals: BP 150/59 mmHg  Pulse 107  Temp(Src) 36.6 C (Oral)  Resp 18  Ht 5\' 1"  (1.549 m)  Wt 132 lb 4.4 oz (60 kg)  BMI 25.01 kg/m2  SpO2 91%  Post vital signs: Reviewed  Level of consciousness: awake  Complications: No apparent anesthesia complications

## 2014-08-28 NOTE — Progress Notes (Signed)
Clinical Social Work Department BRIEF PSYCHOSOCIAL ASSESSMENT 08/28/2014  Patient:  Rockney GheeJONES,Lakysha     Account Number:  [id number]     Admit date:  08/26/2014  Clinical Social Worker:  Garlan FairKIDD,SUZANNA, LCSWA  Date/Time:  08/28/2014 01:30 PM  Referred by:  Physician  Date Referred:  08/28/2014 Referred for  ALF Placement   Other Referral:   Interview type:  Family Other interview type:    PSYCHOSOCIAL DATA Living Status:  FACILITY [facility] [place] Level of care:  Assisted Living Primary support name:  Brad Freestone/son/[phone] Primary support relationship to patient:  CHILD, ADULT Degree of support available:   adequate    CURRENT CONCERNS Current Concerns  Post-Acute Placement   Other Concerns:    SOCIAL WORK ASSESSMENT / PLAN CSW received [facility].    CSW reviewed chart and noted that pt oriented to person only. CSW contacted pt son, Nida BoatmanBrad via telephone. CSW introduced self and explained role. Pt son confirmed that [facility] and has been at ALF for one year. Pt son states that pt is wheelchair bound at ALF, but has been able to get herself around using the wheelchair at the ALF. CSW discussed that CSW will request MD to place PT/OT evaluation orders once MD feels that pt is stable to participate which will assist in determining if ALF can continue to meet pt needs or if [facility]. Pt son expressed understanding. Pt son discussed that pt [facility], but did not have a good experience at SNF in the past. Pt son shared that they are hopeful that pt will be able to [facility] and [facility], but recogonize [facility].    CSW notified MD to order PT/OT when pt stable to participate.    CSW to continue to follow to assess pt disposition needs.   Assessment/plan status:  Psychosocial  Support/Ongoing Assessment of Needs Other assessment/ plan:   discharge planning   Information/referral to community resources:   referrals pending at this time; [facility], PT/OT evals pending to assess pt needs for d/c.    PATIENT'S/FAMILY'S RESPONSE TO PLAN OF CARE: Pt oriented to person only. Pt son, Nida BoatmanBrad supportive and actively involved in pt care. Pt son is hopeful that pt will be able to [facility] as pt has not had positive experiences at SNF in the past, but expresses understanding for potential [facility] depending on how pt progresses.    Loletta SpecterSuzanna Kidd, MSW, LCSW Clinical Social Work [phone]

## 2014-08-28 NOTE — Anesthesia Preprocedure Evaluation (Addendum)
Anesthesia Evaluation  Patient identified by MRN, date of birth, ID band Patient awake    Reviewed: Allergy & Precautions, H&P , NPO status , Patient's Chart, lab work & pertinent test results  History of Anesthesia Complications (+) DIFFICULT AIRWAY and history of anesthetic complications  Airway Mallampati: II  TM Distance: >3 FB Neck ROM: Full    Dental no notable dental hx.    Pulmonary neg pulmonary ROS,  breath sounds clear to auscultation  Pulmonary exam normal       Cardiovascular hypertension, + CAD and + Past MI Rhythm:Regular Rate:Normal     Neuro/Psych PSYCHIATRIC DISORDERS Depression negative neurological ROS     GI/Hepatic Neg liver ROS, GERD-  ,  Endo/Other  diabetes, Type 2, Insulin Dependent  Renal/GU Renal disease     Musculoskeletal  (+) Arthritis -,   Abdominal   Peds  Hematology negative hematology ROS (+) anemia ,   Anesthesia Other Findings   Reproductive/Obstetrics negative OB ROS                            Anesthesia Physical Anesthesia Plan  ASA: III  Anesthesia Plan: MAC   Post-op Pain Management:    Induction: Intravenous  Airway Management Planned:   Additional Equipment:   Intra-op Plan:   Post-operative Plan:   Informed Consent: I have reviewed the patients History and Physical, chart, labs and discussed the procedure including the risks, benefits and alternatives for the proposed anesthesia with the patient or authorized representative who has indicated his/her understanding and acceptance.   Dental advisory given  Plan Discussed with: CRNA  Anesthesia Plan Comments:         Anesthesia Quick Evaluation

## 2014-08-28 NOTE — Progress Notes (Signed)
PROGRESS NOTE  Kaitlyn Burns JJO:[id number] DOB: [birth year] DOA: 08/26/2014 PCP: Reita Cliche, MD  Brief history  80 year old female with a history of diabetes mellitus, CKD stage II-III, hypertension, hypothyroidism presented from [facility] with altered mental status. Unfortunately, the patient is unable to provide any significant history. I spoke with the patient's son, Mikki Santee.  He revealed to me that the patient has a long history of alcohol dependence. More recently, the patient has been drinking one to 2 glasses of wine daily. At her assisted living facility, they are allowed to keep her own alcohol in the room.  At baseline, the patient is able to make transfers from the bed to her wheelchair with which she gets around. Unfortunately, the patient has much difficulty ambulating at baseline. The patient was noted to have acute kidney injury with elevated serum creatinine and elevated point-of-care troponin in the emergency department. In addition, fecal occult blood test was also positive. The patient was also noted to have relative hypotension with systolic blood pressures in the mid 90s. Notably, the patient was admitted in January 2015 with significant anemia with hemoglobin of 4.8. She has not had any endoscopy or GI follow-up. During her January admission, the patient was given 3 units PRBC. Assessment/Plan: Acute encephalopathy -Multifactorial including anemia, acute kidney injury, dehydration and possibly alcohol withdrawal and Wernicke's encephalopathy -Check for reversible causes of encephalopathy including ammonia, B12, RBC folate -TSH--0.123  -Chek thiamine level  -Start thiamine high dose--547m tid x 2 days -CIWA scale Anemia with positive FOBT/melena -FOBT was positive in the emergency department -I have performed rectal exam on 12/21-->melena and positive FOBT again -appreciate GI followup -08/27/2014 EGD--negative -Plans for colonoscopy 08/28/2014 -hold ASA  for now -continue protonix  -The patient was noted to have iron deficiency back in January 2015 with iron saturation of 4% -CT brain negative for acute findings Alcohol dependence  -CIWA -as discussed above Sinus tachycardia -may be rebound from being off diltiazem vs Etoh/opioid withdraw -TSH 0.123--although a little suppressed, suspect sick euthyroid syndrome -check free T4 -Echo -continue IVF Acute on chronic renal failure (CKD2-3) -baseline creatinine 0.9-1.2 -renal us--neg for hydronephrosis -continue IVF -Hold lisinopril Elevated point-of-care troponin/abnormal EKG -Cycle troponins--neg x 3 -no chest pain presently Diabetes mellitus type 2 -Hemoglobin A1c--5.8 -NovoLog sliding scale Leukocytosis -suspect stress demargination, improved without any abx -Urinalysis without pyuria -Chest x-ray negative for infiltrates  -blood cultures 2 sets--negative today - as the patient's blood pressure is stable and she is afebrile, we'll hold off on starting antibiotics for now and monitor clinically  Hypothyroidism  -TSH is 0.123  -Continue Synthroid  Relative hypotension  -improving with IVF -Continue to hold lisinopril and diltiazem Hypokalemia -Repleted -Check magnesium  Family Communication:discussed with son BMikki Santeeon phone Disposition Plan: ALF vs SNF when medically stable        Procedures/Studies: Ct Abdomen Pelvis Wo Contrast  08/26/2014   CLINICAL DATA:  Altered mental status, unable to void  EXAM: CT ABDOMEN AND PELVIS WITHOUT CONTRAST  TECHNIQUE: Multidetector CT imaging of the abdomen and pelvis was performed following the standard protocol without IV contrast.  COMPARISON:  01/28/2010  FINDINGS: Dependent bibasilar atelectasis noted.  Unenhanced liver, adrenal glands, spleen, and pancreas are unremarkable. Motion artifact degrades imaging. Exophytic right lower renal pole 0.8 cm probable hyperdense cyst is identified, unchanged from the prior exam. Sub  cm low-density bilateral renal cortical lesions elsewhere are reidentified but not further characterized on the current noncontrast exam.  No free fluid or air. No lymphadenopathy.  Normal appendix, image 59. No bowel wall thickening or focal segmental dilatation is identified. Bladder is decompressed but unremarkable. Uterus not visualized. Ovaries are normal. Moderate atheromatous aortic calcification noted without aneurysm. Multilevel disc degenerative change noted in the spine.  IMPRESSION: No acute intra-abdominal or pelvic pathology.   Electronically Signed   By: Conchita Paris M.D.   On: 08/26/2014 23:47   Ct Head Wo Contrast  08/26/2014   CLINICAL DATA:  Acute onset of confusion. Patient has not voided today. Initial encounter.  EXAM: CT HEAD WITHOUT CONTRAST  TECHNIQUE: Contiguous axial images were obtained from the base of the skull through the vertex without intravenous contrast.  COMPARISON:  CT of the head performed 01/17/2010  FINDINGS: There is no evidence of acute infarction, mass lesion, or intra- or extra-axial hemorrhage on CT.  Prominence of the ventricles and sulci reflects mild to moderate cortical volume loss. Cerebellar atrophy is noted. Mild periventricular and subcortical white matter change likely reflects small vessel ischemic microangiopathy.  The brainstem and fourth ventricle are within normal limits. The basal ganglia are unremarkable in appearance. The cerebral hemispheres demonstrate grossly normal gray-white differentiation. No mass effect or midline shift is seen.  There is no evidence of fracture; visualized osseous structures are unremarkable in appearance. The orbits are within normal limits. Dense inspissated mucus is noted within the right maxillary sinus. The remaining paranasal sinuses and mastoid air cells are well-aerated. No significant soft tissue abnormalities are seen.  IMPRESSION: 1. No acute intracranial pathology seen on CT. 2. Mild to moderate cortical volume  loss and scattered small vessel ischemic microangiopathy. 3. Dense inspissated mucus noted within the right maxillary sinus.   Electronically Signed   By: Garald Balding M.D.   On: 08/26/2014 23:46   US Renal  08/27/2014   CLINICAL DATA:  Subsequent encounter for acute on chronic renal failure  EXAM: RENAL/URINARY TRACT ULTRASOUND COMPLETE  COMPARISON:  CT scan from 08/26/2014.  FINDINGS: Right Kidney:  Length: 10.6 cm. Poor acoustic window limited assessment. The low-density lesion seen on the CT scan yesterday could not be visualized by ultrasound today.  Left Kidney:  Length: 9.8 cm. Echogenicity within normal limits. No mass or hydronephrosis visualized.  Bladder:  Appears normal for degree of bladder distention.  IMPRESSION: Limited study secondary to poor acoustic window bilaterally. No hydronephrosis. Right renal cyst seen on CT scan yesterday not evident on ultrasound today.   Electronically Signed   By: Misty Stanley M.D.   On: 08/27/2014 10:15   Dg Chest Portable 1 View  08/26/2014   CLINICAL DATA:  Altered mental status, diabetes  EXAM: PORTABLE CHEST - 1 VIEW  COMPARISON:  None  FINDINGS: Heart size is within normal limits. The aorta is unfolded and ectatic. No focal pulmonary opacity. Evidence of arose origin or remote resection of the distal right clavicle. No acute osseous abnormality. No pleural effusion.  IMPRESSION: No acute cardiopulmonary process.   Electronically Signed   By: Conchita Paris M.D.   On: 08/26/2014 22:37         Subjective:  patient intermittently follows commands. She is pleasantly confused. Denies any chest pain, abdominal pain, shortness breath. No reports of vomiting, or distress, uncontrolled pain  Objective: Filed Vitals:   08/28/14 0800 08/28/14 0918 08/28/14 0940 08/28/14 1200  BP: 141/45 123/51 150/59   Pulse: 109 107    Temp:  98.3 F (36.8 C)  97.9 F (36.6 C)  TempSrc:  Oral  Oral  Resp: 19 18    Height:      Weight:      SpO2: 100% 91%       Intake/Output Summary (Last 24 hours) at 08/28/14 1506 Last data filed at 08/28/14 0086  Gross per 24 hour  Intake 2082.5 ml  Output    625 ml  Net 1457.5 ml   Weight change:  Exam:   General:  Pt is alert, follows commands appropriately, not in acute distress  HEENT: No icterus, No thrush, No meningismus, [place]/AT  Cardiovascular: RRR, S1/S2, no rubs, no gallops  Respiratory: poor inspiratory effort but clear to auscultation   Abdomen: Soft/+BS, non tender, non distended, no guarding  Extremities: No edema, No lymphangitis, No petechiae, No rashes, no synovitis  Data Reviewed: Basic Metabolic Panel:  Recent Labs Lab 08/26/14 2134 08/27/14 0223 08/28/14 0510  NA 133* 136* 139  K 3.6* 3.9 3.2*  CL 95* 103 115*  CO2 18* 16* 15*  GLUCOSE 117* 95 70  BUN 34* 31* 30*  CREATININE 2.98* 2.56* 1.65*  CALCIUM 9.5 8.3* 7.8*   Liver Function Tests:  Recent Labs Lab 08/26/14 2134  AST 28  ALT 26  ALKPHOS 152*  BILITOT 0.2*  PROT 6.3  ALBUMIN 2.9*   No results for input(s): LIPASE, AMYLASE in the last 168 hours. No results for input(s): AMMONIA in the last 168 hours. CBC:  Recent Labs Lab 08/26/14 2134 08/27/14 0223 08/27/14 0755 08/27/14 1715 08/28/14 0125 08/28/14 0510 08/28/14 1106  WBC 18.0* 13.5*  --   --   --  9.9  --   NEUTROABS 13.9*  --   --   --   --   --   --   HGB 11.1* 11.3* 10.1* 10.2* 9.8* 10.1* 9.7*  HCT 33.9* 35.5* 31.5* 31.6* 30.6* 31.2* 29.6*  MCV 101.8* 102.9*  --   --   --  102.0*  --   PLT 295 232  --   --   --  184  --    Cardiac Enzymes:  Recent Labs Lab 08/27/14 0223 08/27/14 0755 08/27/14 1050 08/27/14 1313 08/27/14 1715 08/27/14 2255 08/28/14 0510 08/28/14 1106  CKTOTAL 61  --  54  --  54 49 35 44  CKMB 12.2*  --  9.6*  --  9.2* 12.2* 12.6* 10.8*  TROPONINI <0.30 <0.30  --  <0.30  --   --   --   --    BNP: Invalid input(s): POCBNP CBG:  Recent Labs Lab 08/27/14 2308 08/28/14 0355 08/28/14 0754  08/28/14 1027 08/28/14 1201  GLUCAP 74 76 71 57* 152*    Recent Results (from the past 240 hour(s))  MRSA PCR Screening     Status: None   Collection Time: 08/27/14  1:56 AM  Result Value Ref Range Status   MRSA by PCR NEGATIVE NEGATIVE Final    Comment:        The GeneXpert MRSA Assay (FDA approved for NASAL specimens only), is one component of a comprehensive MRSA colonization surveillance program. It is not intended to diagnose MRSA infection nor to guide or monitor treatment for MRSA infections.   Culture, blood (routine x 2)     Status: None (Preliminary result)   Collection Time: 08/27/14 10:50 AM  Result Value Ref Range Status   Specimen Description BLOOD LEFT ARM  Final   Special Requests BOTTLES DRAWN AEROBIC AND ANAEROBIC [facility]  Final   Culture  Setup Time   Final  08/27/2014 13:22 Performed at [facility]    Culture   Final           BLOOD CULTURE RECEIVED NO GROWTH TO DATE CULTURE WILL BE HELD FOR 5 DAYS BEFORE ISSUING A FINAL NEGATIVE REPORT Performed at [facility]    Report Status PENDING  Incomplete  Culture, blood (routine x 2)     Status: None (Preliminary result)   Collection Time: 08/27/14 10:55 AM  Result Value Ref Range Status   Specimen Description BLOOD RIGHT ARM  Final   Special Requests BOTTLES DRAWN AEROBIC ONLY 5CC  Final   Culture  Setup Time   Final    08/27/2014 13:22 Performed at [facility]    Culture   Final           BLOOD CULTURE RECEIVED NO GROWTH TO DATE CULTURE WILL BE HELD FOR 5 DAYS BEFORE ISSUING A FINAL NEGATIVE REPORT Performed at [facility]    Report Status PENDING  Incomplete     Scheduled Meds: . insulin aspart  0-9 Units Subcutaneous 6 times per day  . levothyroxine  75 mcg Oral QAC breakfast  . pantoprazole (PROTONIX) IV  40 mg Intravenous Q12H  . peg 3350 powder  0.5 kit Oral BID   Continuous Infusions: . sodium chloride 75 mL/hr at 08/28/14 0700     Shawne Eskelson,  DO  [facility] Pager [phone]  If 7PM-7AM, please contact night-coverage [email or web address] Password [email or web address] 08/28/2014, 3:06 PM   LOS: 2 days

## 2014-08-28 NOTE — Progress Notes (Signed)
Upper endoscopy was entirely normal.  There was no fresh or old blood.  Plan colonoscopy in a.m.

## 2014-08-28 NOTE — Transfer of Care (Signed)
Immediate Anesthesia Transfer of Care Note  Patient: Kaitlyn Burns  Procedure(s) Performed: Procedure(s): ESOPHAGOGASTRODUODENOSCOPY (EGD) WITH PROPOFOL (N/A)  Patient Location: PACU and Endoscopy Unit  Anesthesia Type:MAC  Level of Consciousness: awake, oriented, lethargic and responds to stimulation  Airway & Oxygen Therapy: Patient Spontanous Breathing and Patient connected to nasal cannula oxygen  Post-op Assessment: Report given to PACU RN, Post -op Vital signs reviewed and stable and Patient moving all extremities  Post vital signs: Reviewed and stable  Complications: No apparent anesthesia complications

## 2014-08-28 NOTE — Care Management Note (Signed)
    Page 1 of 1   08/28/2014     11:15:01 AM CARE MANAGEMENT NOTE 08/28/2014  Patient:  Kaitlyn GheeJONES,Kaitlyn   Account Number:  [id number]  Date Initiated:  08/28/2014  Documentation initiated by:  Lanier ClamMAHABIR,Camiya Vinal  Subjective/Objective Assessment:   80 y/o f admitted w/rectal bleed.     Action/Plan:   From alf-[facility].   Anticipated DC Date:  08/31/2014   Anticipated DC Plan:  ASSISTED LIVING / REST HOME      DC Planning Services  CM consult      Choice offered to / List presented to:             Status of service:  In process, will continue to follow Medicare Important Message given?   (If response is "NO", the following Medicare IM given date fields will be blank) Date Medicare IM given:   Medicare IM given by:   Date Additional Medicare IM given:   Additional Medicare IM given by:    Discharge Disposition:    Per UR Regulation:  Reviewed for med. necessity/level of care/duration of stay  If discussed at Long Length of Stay Meetings, dates discussed:    Comments:  08/28/14 Lanier ClamKathy Ramere Downs RN BSN NCM [phone] Would recommend PT/OT cons.

## 2014-08-28 NOTE — Op Note (Signed)
[facility] [address] [place], [id number]   ENDOSCOPY PROCEDURE REPORT  PATIENT: Kaitlyn Burns, Early  MR#: [id number] BIRTHDATE: 01/25/1925 , 89  yrs. old GENDER: female ENDOSCOPIST: Louis Meckelobert D Arlenis Blaydes, MD REFERRED BY: PROCEDURE DATE:  08/28/2014 PROCEDURE:  EGD, diagnostic ASA CLASS:     Class III INDICATIONS:  melena. MEDICATIONS: Monitored anesthesia care TOPICAL ANESTHETIC:  DESCRIPTION OF PROCEDURE: After the risks benefits and alternatives of the procedure were thoroughly explained, informed consent was obtained.  The Pentax Gastroscope [id number] endoscope was introduced through the mouth and advanced to the second portion of the duodenum , Without limitations.  The instrument was slowly withdrawn as the mucosa was fully examined.      EXAM: The esophagus and gastroesophageal junction were completely normal in appearance.  The stomach was entered and closely examined.The antrum, angularis, and lesser curvature were well visualized, including a retroflexed view of the cardia and fundus. The stomach wall was normally distensable.  The scope passed easily through the pylorus into the duodenum.  Retroflexed views revealed no abnormalities.     The scope was then withdrawn from the patient and the procedure completed.  COMPLICATIONS: There were no immediate complications.  ENDOSCOPIC IMPRESSION: Normal appearing esophagus and GE junction, the stomach was well visualized and normal in appearance, normal appearing duodenum  RECOMMENDATIONS: Proceed with a Colonoscopy.  REPEAT EXAM:  eSigned:  Louis Meckelobert D Jaben Benegas, MD 08/28/2014 9:10 AM    CC: [name], MD

## 2014-08-29 ENCOUNTER — Encounter (HOSPITAL_COMMUNITY): Payer: Self-pay | Admitting: Gastroenterology

## 2014-08-29 ENCOUNTER — Encounter (HOSPITAL_COMMUNITY)
Admission: EM | Disposition: A | Discharge status: Skilled Nursing Facility | Payer: Self-pay | Source: Home / Self Care | Attending: Internal Medicine

## 2014-08-29 DIAGNOSIS — K529 Noninfective gastroenteritis and colitis, unspecified: Secondary | ICD-10-CM

## 2014-08-29 HISTORY — PX: COLONOSCOPY: SHX5424

## 2014-08-29 LAB — BASIC METABOLIC PANEL
Anion gap: 8 (ref 5–15)
BUN: 20 mg/dL (ref 6–23)
CALCIUM: 8.1 mg/dL — AB (ref 8.4–10.5)
CO2: 16 mmol/L — AB (ref 19–32)
CREATININE: 0.91 mg/dL (ref 0.50–1.10)
Chloride: 123 mEq/L — ABNORMAL HIGH (ref 96–112)
GFR calc Af Amer: 64 mL/min — ABNORMAL LOW (ref >90)
GFR, EST NON AFRICAN AMERICAN: 55 mL/min — AB (ref >90)
GLUCOSE: 90 mg/dL (ref 70–99)
Potassium: 2.8 mmol/L — ABNORMAL LOW (ref 3.5–5.1)
Sodium: 147 mmol/L — ABNORMAL HIGH (ref 135–145)

## 2014-08-29 LAB — BLOOD GAS, ARTERIAL
ACID-BASE DEFICIT: 8.8 mmol/L — AB (ref 0.0–2.0)
Bicarbonate: 16.9 mEq/L — ABNORMAL LOW (ref 20.0–24.0)
Drawn by: 103701
O2 CONTENT: 2 L/min
O2 Saturation: 90.6 %
PATIENT TEMPERATURE: 98.6
PCO2 ART: 38.3 mmHg (ref 35.0–45.0)
PH ART: 7.268 — AB (ref 7.350–7.450)
TCO2: 16.3 mmol/L (ref 0–100)
pO2, Arterial: 59.2 mmHg — ABNORMAL LOW (ref 80.0–100.0)

## 2014-08-29 LAB — GLUCOSE, CAPILLARY
GLUCOSE-CAPILLARY: 93 mg/dL (ref 70–99)
GLUCOSE-CAPILLARY: 92 mg/dL (ref 70–99)
GLUCOSE-CAPILLARY: 126 mg/dL — AB (ref 70–99)
GLUCOSE-CAPILLARY: 116 mg/dL — AB (ref 70–99)
Glucose-Capillary: 79 mg/dL (ref 70–99)
Glucose-Capillary: 90 mg/dL (ref 70–99)

## 2014-08-29 LAB — CBC
HEMATOCRIT: 30.6 % — AB (ref 36.0–46.0)
Hemoglobin: 10 g/dL — ABNORMAL LOW (ref 12.0–15.0)
MCH: 32.7 pg (ref 26.0–34.0)
MCHC: 32.7 g/dL (ref 30.0–36.0)
MCV: 100 fL (ref 78.0–100.0)
PLATELETS: 179 10*3/uL (ref 150–400)
RBC: 3.06 MIL/uL — ABNORMAL LOW (ref 3.87–5.11)
RDW: 14.9 % (ref 11.5–15.5)
WBC: 11.4 10*3/uL — ABNORMAL HIGH (ref 4.0–10.5)

## 2014-08-29 LAB — RPR

## 2014-08-29 LAB — FOLATE RBC: RBC FOLATE: 750 ng/mL — AB (ref >280)

## 2014-08-29 LAB — VITAMIN B12: VITAMIN B 12: 1213 pg/mL — AB (ref 211–911)

## 2014-08-29 LAB — T4, FREE: Free T4: 0.82 ng/dL (ref 0.80–1.80)

## 2014-08-29 LAB — MAGNESIUM: Magnesium: 1.5 mg/dL (ref 1.5–2.5)

## 2014-08-29 LAB — HIV ANTIBODY (ROUTINE TESTING W REFLEX): HIV 1&2 Ab, 4th Generation: NONREACTIVE

## 2014-08-29 SURGERY — COLONOSCOPY
Anesthesia: Moderate Sedation

## 2014-08-29 MED ORDER — DILTIAZEM HCL ER 240 MG PO CP24
240.0000 mg | ORAL_CAPSULE | Freq: Every day | ORAL | Status: DC
  Administered 2014-08-30 – 2014-09-04 (×5): 240 mg via ORAL
  Filled 2014-08-29 (×7): qty 1

## 2014-08-29 MED ORDER — LEVOTHYROXINE SODIUM 75 MCG PO TABS: 75.0000 ug | ORAL_TABLET | Freq: Every day | ORAL | Status: DC

## 2014-08-29 MED ORDER — BOOST / RESOURCE BREEZE PO LIQD
1.0000 | Freq: Three times a day (TID) | ORAL | Status: DC
  Administered 2014-08-29 – 2014-09-04 (×8): 1 via ORAL

## 2014-08-29 MED ORDER — MIDAZOLAM HCL 10 MG/2ML IJ SOLN
INTRAMUSCULAR | Status: AC
  Filled 2014-08-29: qty 2

## 2014-08-29 MED ORDER — INFLUENZA VAC SPLIT QUAD 0.5 ML IM SUSY
0.5000 mL | PREFILLED_SYRINGE | INTRAMUSCULAR | Status: AC
  Administered 2014-08-30: 0.5 mL via INTRAMUSCULAR
  Filled 2014-08-29 (×2): qty 0.5

## 2014-08-29 MED ORDER — SODIUM CHLORIDE 0.9 % IV SOLN: INTRAVENOUS | Status: DC

## 2014-08-29 MED ORDER — ALLOPURINOL 100 MG PO TABS
100.0000 mg | ORAL_TABLET | Freq: Two times a day (BID) | ORAL | Status: DC
  Administered 2014-08-29 – 2014-09-04 (×10): 100 mg via ORAL
  Filled 2014-08-29 (×13): qty 1

## 2014-08-29 MED ORDER — PNEUMOCOCCAL VAC POLYVALENT 25 MCG/0.5ML IJ INJ
0.5000 mL | INJECTION | INTRAMUSCULAR | Status: AC
  Administered 2014-08-30: 0.5 mL via INTRAMUSCULAR
  Filled 2014-08-29 (×2): qty 0.5

## 2014-08-29 MED ORDER — LATANOPROST 0.005 % OP SOLN
1.0000 [drp] | Freq: Every day | OPHTHALMIC | Status: DC
  Administered 2014-08-29 – 2014-09-03 (×6): 1 [drp] via OPHTHALMIC
  Filled 2014-08-29: qty 2.5

## 2014-08-29 MED ORDER — MAGNESIUM SULFATE 50 % IJ SOLN
3.0000 g | Freq: Once | INTRAMUSCULAR | Status: AC
  Administered 2014-08-29: 3 g via INTRAVENOUS
  Filled 2014-08-29: qty 6

## 2014-08-29 MED ORDER — FENTANYL CITRATE 0.05 MG/ML IJ SOLN
INTRAMUSCULAR | Status: AC
  Filled 2014-08-29: qty 2

## 2014-08-29 MED ORDER — POTASSIUM CHLORIDE 2 MEQ/ML IV SOLN
INTRAVENOUS | Status: DC
  Administered 2014-08-29: 11:00:00 via INTRAVENOUS
  Filled 2014-08-29 (×4): qty 1000

## 2014-08-29 MED ORDER — STERILE WATER FOR INJECTION IV SOLN
INTRAVENOUS | Status: DC
  Administered 2014-08-29 – 2014-08-31 (×6): via INTRAVENOUS
  Filled 2014-08-29 (×10): qty 9.7

## 2014-08-29 MED ORDER — POTASSIUM CHLORIDE 10 MEQ/100ML IV SOLN
10.0000 meq | INTRAVENOUS | Status: AC
  Administered 2014-08-29 (×4): 10 meq via INTRAVENOUS
  Filled 2014-08-29 (×7): qty 100

## 2014-08-29 MED ORDER — HYDROCORTISONE ACETATE 25 MG RE SUPP: 25.0000 mg | Freq: Two times a day (BID) | RECTAL | Status: DC | PRN

## 2014-08-29 MED ORDER — STERILE WATER FOR INJECTION IV SOLN: INTRAVENOUS | Status: DC

## 2014-08-29 MED ORDER — LISINOPRIL 2.5 MG PO TABS
2.5000 mg | ORAL_TABLET | Freq: Every day | ORAL | Status: DC
  Administered 2014-08-30 – 2014-08-31 (×2): 2.5 mg via ORAL
  Filled 2014-08-29 (×3): qty 1

## 2014-08-29 MED ORDER — MIDAZOLAM HCL 5 MG/5ML IJ SOLN
INTRAMUSCULAR | Status: DC | PRN
  Administered 2014-08-29: 0.5 mg via INTRAVENOUS

## 2014-08-29 NOTE — Progress Notes (Addendum)
PROGRESS NOTE  Kaitlyn Burns ZOX:[id number] DOB: 01/03/1925 DOA: 08/26/2014 PCP: Brooke Bonito, MD  Brief history  80 year old female with a history of diabetes mellitus, CKD stage II-III, hypertension, hypothyroidism presented from [facility] with altered mental status. Unfortunately, the patient is unable to provide any significant history. I spoke with the patient's son, [name].  He revealed to me that the patient has a long history of alcohol dependence. More recently, the patient has been drinking one to 2 glasses of wine daily. At her assisted living facility, they are allowed to keep her own alcohol in the room.  At baseline, the patient is able to make transfers from the bed to her wheelchair with which she gets around. Unfortunately, the patient has much difficulty ambulating at baseline. The patient was noted to have acute kidney injury with elevated serum creatinine and elevated point-of-care troponin in the emergency department. In addition, fecal occult blood test was also positive. The patient was also noted to have relative hypotension with systolic blood pressures in the mid 90s. Notably, the patient was admitted in January 2015 with significant anemia with hemoglobin of 4.8. She has not had any endoscopy or GI follow-up. During her January admission, the patient was given 3 units PRBC.    Assessment/Plan: Acute encephalopathy, most likely secondary to alcohol withdrawal, patient tachycardic, hypertensive -Multifactorial including anemia, acute kidney injury, dehydration and possibly alcohol withdrawal and Wernicke's encephalopathy -Check for reversible causes of encephalopathy including ammonia, B12, RBC folate -TSH--0.123   thiamine level pending -Start thiamine high dose--500mg  tid x 2 days -CIWA scale CT head negative Speech therapy evaluation, PT/OT eval Repeat ABG   Anemia with positive FOBT/melena -FOBT was positive in the emergency department -I have  performed rectal exam on 12/21-->melena and positive FOBT again Hemoglobin stable -08/27/2014 EGD--negative Colonoscopy shows possible ischemic colitis -hold ASA for now -continue protonix    Alcohol dependence  -CIWA -as discussed above  Sinus tachycardia -may be rebound from being off diltiazem vs Etoh/opioid withdraw -TSH 0.123--although a little suppressed, suspect sick euthyroid syndrome -Echo pending -continue IVF Restart diltiazem  Acute on chronic renal failure (CKD2-3) -baseline creatinine 0.9-1.2 -renal us--neg for hydronephrosis -continue IVF Renal function improving, restart lisinopril  Elevated point-of-care troponin/abnormal EKG -Cycle troponins--neg x 3 -no chest pain presently  Diabetes mellitus type 2 -Hemoglobin A1c--5.8 -NovoLog sliding scale  Leukocytosis -suspect stress demargination, improved without any abx -Urinalysis without pyuria -Chest x-ray negative for infiltrates  -blood cultures 2 sets--negative today - as the patient's blood pressure is stable and she is afebrile, we'll hold off on starting antibiotics for now and monitor clinically    Hypothyroidism  -TSH is 0.123 , free T4 is normal -Continue Synthroid   Relative hypotension  -improving with IVF Restart antihypertensive medications   Hypokalemia Replete potassium Magnesium 1.5, replete  Family Communication:discussed with son [name] on phone Disposition Plan: Transfer to telemetry       Procedures/Studies: Ct Abdomen Pelvis Wo Contrast  08/26/2014   CLINICAL DATA:  Altered mental status, unable to void  EXAM: CT ABDOMEN AND PELVIS WITHOUT CONTRAST  TECHNIQUE: Multidetector CT imaging of the abdomen and pelvis was performed following the standard protocol without IV contrast.  COMPARISON:  01/28/2010  FINDINGS: Dependent bibasilar atelectasis noted.  Unenhanced liver, adrenal glands, spleen, and pancreas are unremarkable. Motion artifact degrades imaging.  Exophytic right lower renal pole 0.8 cm probable hyperdense cyst is identified, unchanged from the prior exam. Sub cm low-density bilateral  renal cortical lesions elsewhere are reidentified but not further characterized on the current noncontrast exam. No free fluid or air. No lymphadenopathy.  Normal appendix, image 59. No bowel wall thickening or focal segmental dilatation is identified. Bladder is decompressed but unremarkable. Uterus not visualized. Ovaries are normal. Moderate atheromatous aortic calcification noted without aneurysm. Multilevel disc degenerative change noted in the spine.  IMPRESSION: No acute intra-abdominal or pelvic pathology.   Electronically Signed   By: Christiana PellantGretchen  Green M.D.   On: 08/26/2014 23:47   Ct Head Wo Contrast  08/26/2014   CLINICAL DATA:  Acute onset of confusion. Patient has not voided today. Initial encounter.  EXAM: CT HEAD WITHOUT CONTRAST  TECHNIQUE: Contiguous axial images were obtained from the base of the skull through the vertex without intravenous contrast.  COMPARISON:  CT of the head performed 01/17/2010  FINDINGS: There is no evidence of acute infarction, mass lesion, or intra- or extra-axial hemorrhage on CT.  Prominence of the ventricles and sulci reflects mild to moderate cortical volume loss. Cerebellar atrophy is noted. Mild periventricular and subcortical white matter change likely reflects small vessel ischemic microangiopathy.  The brainstem and fourth ventricle are within normal limits. The basal ganglia are unremarkable in appearance. The cerebral hemispheres demonstrate grossly normal gray-white differentiation. No mass effect or midline shift is seen.  There is no evidence of fracture; visualized osseous structures are unremarkable in appearance. The orbits are within normal limits. Dense inspissated mucus is noted within the right maxillary sinus. The remaining paranasal sinuses and mastoid air cells are well-aerated. No significant soft tissue  abnormalities are seen.  IMPRESSION: 1. No acute intracranial pathology seen on CT. 2. Mild to moderate cortical volume loss and scattered small vessel ischemic microangiopathy. 3. Dense inspissated mucus noted within the right maxillary sinus.   Electronically Signed   By: Roanna RaiderJeffery  Chang M.D.   On: 08/26/2014 23:46   [place] Renal  08/27/2014   CLINICAL DATA:  Subsequent encounter for acute on chronic renal failure  EXAM: RENAL/URINARY TRACT ULTRASOUND COMPLETE  COMPARISON:  CT scan from 08/26/2014.  FINDINGS: Right Kidney:  Length: 10.6 cm. Poor acoustic window limited assessment. The low-density lesion seen on the CT scan yesterday could not be visualized by ultrasound today.  Left Kidney:  Length: 9.8 cm. Echogenicity within normal limits. No mass or hydronephrosis visualized.  Bladder:  Appears normal for degree of bladder distention.  IMPRESSION: Limited study secondary to poor acoustic window bilaterally. No hydronephrosis. Right renal cyst seen on CT scan yesterday not evident on ultrasound today.   Electronically Signed   By: Kennith CenterEric  Mansell M.D.   On: 08/27/2014 10:15   Dg Chest Portable 1 View  08/26/2014   CLINICAL DATA:  Altered mental status, diabetes  EXAM: PORTABLE CHEST - 1 VIEW  COMPARISON:  None  FINDINGS: Heart size is within normal limits. The aorta is unfolded and ectatic. No focal pulmonary opacity. Evidence of arose origin or remote resection of the distal right clavicle. No acute osseous abnormality. No pleural effusion.  IMPRESSION: No acute cardiopulmonary process.   Electronically Signed   By: Christiana PellantGretchen  Green M.D.   On: 08/26/2014 22:37        Subjective:  patient intermittently follows commands. She is pleasantly confused. Unable to have a meaningful conversation on for any questions, cannot remember her date of birth  Objective: Filed Vitals:   08/29/14 1010 08/29/14 1015 08/29/14 1020 08/29/14 1025  BP: 181/77 166/74 167/56 153/64  Pulse: 40 113 109 106  Temp:        TempSrc:      Resp: 28 25 19 18   Height:      Weight:      SpO2: 98% 100% 100% 98%    Intake/Output Summary (Last 24 hours) at 08/29/14 1040 Last data filed at 08/29/14 0800  Gross per 24 hour  Intake 2166.25 ml  Output    675 ml  Net 1491.25 ml   Weight change:  Exam:   General:  Pt is alert, follows commands appropriately, not in acute distress  HEENT: No icterus, No thrush, No meningismus, [place]/AT  Cardiovascular: RRR, S1/S2, no rubs, no gallops  Respiratory: poor inspiratory effort but clear to auscultation   Abdomen: Soft/+BS, non tender, non distended, no guarding  Extremities: No edema, No lymphangitis, No petechiae, No rashes, no synovitis  Data Reviewed: Basic Metabolic Panel:  Recent Labs Lab 08/26/14 2134 08/27/14 0223 08/28/14 0510 08/29/14 0540  NA 133* 136* 139 147*  K 3.6* 3.9 3.2* 2.8*  CL 95* 103 115* 123*  CO2 18* 16* 15* 16*  GLUCOSE 117* 95 70 90  BUN 34* 31* 30* 20  CREATININE 2.98* 2.56* 1.65* 0.91  CALCIUM 9.5 8.3* 7.8* 8.1*  MG  --   --   --  1.5   Liver Function Tests:  Recent Labs Lab 08/26/14 2134  AST 28  ALT 26  ALKPHOS 152*  BILITOT 0.2*  PROT 6.3  ALBUMIN 2.9*   No results for input(s): LIPASE, AMYLASE in the last 168 hours.  Recent Labs Lab 08/28/14 1833  AMMONIA 50*   CBC:  Recent Labs Lab 08/26/14 2134 08/27/14 0223  08/27/14 1715 08/28/14 0125 08/28/14 0510 08/28/14 1106 08/29/14 0540  WBC 18.0* 13.5*  --   --   --  9.9  --  11.4*  NEUTROABS 13.9*  --   --   --   --   --   --   --   HGB 11.1* 11.3*  < > 10.2* 9.8* 10.1* 9.7* 10.0*  HCT 33.9* 35.5*  < > 31.6* 30.6* 31.2* 29.6* 30.6*  MCV 101.8* 102.9*  --   --   --  102.0*  --  100.0  PLT 295 232  --   --   --  184  --  179  < > = values in this interval not displayed. Cardiac Enzymes:  Recent Labs Lab 08/27/14 0223 08/27/14 0755 08/27/14 1050 08/27/14 1313 08/27/14 1715 08/27/14 2255 08/28/14 0510 08/28/14 1106  CKTOTAL 61  --  54  --   54 49 35 44  CKMB 12.2*  --  9.6*  --  9.2* 12.2* 12.6* 10.8*  TROPONINI <0.30 <0.30  --  <0.30  --   --   --   --    BNP: Invalid input(s): POCBNP CBG:  Recent Labs Lab 08/28/14 1641 08/28/14 2009 08/28/14 2312 08/29/14 0320 08/29/14 0818  GLUCAP 117* 93 105* 79 92    Recent Results (from the past 240 hour(s))  MRSA PCR Screening     Status: None   Collection Time: 08/27/14  1:56 AM  Result Value Ref Range Status   MRSA by PCR NEGATIVE NEGATIVE Final    Comment:        The GeneXpert MRSA Assay (FDA approved for NASAL specimens only), is one component of a comprehensive MRSA colonization surveillance program. It is not intended to diagnose MRSA infection nor to guide or monitor treatment for MRSA infections.   Culture, blood (routine x  2)     Status: None (Preliminary result)   Collection Time: 08/27/14 10:50 AM  Result Value Ref Range Status   Specimen Description BLOOD LEFT ARM  Final   Special Requests BOTTLES DRAWN AEROBIC AND ANAEROBIC [facility]  Final   Culture  Setup Time   Final    08/27/2014 13:22 Performed at [facility]    Culture   Final           BLOOD CULTURE RECEIVED NO GROWTH TO DATE CULTURE WILL BE HELD FOR 5 DAYS BEFORE ISSUING A FINAL NEGATIVE REPORT Performed at [facility]    Report Status PENDING  Incomplete  Culture, blood (routine x 2)     Status: None (Preliminary result)   Collection Time: 08/27/14 10:55 AM  Result Value Ref Range Status   Specimen Description BLOOD RIGHT ARM  Final   Special Requests BOTTLES DRAWN AEROBIC ONLY 5CC  Final   Culture  Setup Time   Final    08/27/2014 13:22 Performed at [facility]    Culture   Final           BLOOD CULTURE RECEIVED NO GROWTH TO DATE CULTURE WILL BE HELD FOR 5 DAYS BEFORE ISSUING A FINAL NEGATIVE REPORT Performed at [facility]    Report Status PENDING  Incomplete     Scheduled Meds: . folic acid  1 mg Oral Daily  . insulin aspart  0-9 Units  Subcutaneous 6 times per day  . levothyroxine  75 mcg Oral QAC breakfast  . magnesium sulfate 1 - 4 g bolus IVPB  3 g Intravenous Once  . multivitamin with minerals  1 tablet Oral Daily  . pantoprazole (PROTONIX) IV  40 mg Intravenous Q12H  . potassium chloride  10 mEq Intravenous Q1 Hr x 4  . thiamine IV  500 mg Intravenous TID   Continuous Infusions: . 0.9 % sodium chloride with Davy Pique, DO  [facility] Pager [phone]  If 7PM-7AM, please contact night-coverage [email or web address] Password [email or web address] 08/29/2014, 10:40 AM   LOS: 3 days

## 2014-08-29 NOTE — Progress Notes (Signed)
Spoke to [employer]o [employer] Amanda about patient's potassium level and orders for 4 runs of K in addition to fluid change. Marchelle Folksmanda advised patient currently in procedure but will attempt to get one run started. I appreciate her help.

## 2014-08-29 NOTE — Progress Notes (Signed)
Colonoscopy demonstrated segmental colitis involving the very proximal colon.  I suspect this is due to ischemic colitis.  This is probably the source for her bleeding.  Recommendations #1 await biopsies #2 advance diet

## 2014-08-29 NOTE — H&P (View-Only) (Signed)
Upper endoscopy was entirely normal.  There was no fresh or old blood.  Plan colonoscopy in a.m. 

## 2014-08-29 NOTE — Interval H&P Note (Signed)
History and Physical Interval Note:  08/29/2014 9:20 AM  Kaitlyn Burns  has presented today for surgery, with the diagnosis of Anemia; heme positive stool  The various methods of treatment have been discussed with the patient and family. After consideration of risks, benefits and other options for treatment, the patient has consented to  Procedure(s): COLONOSCOPY (N/A) as a surgical intervention .  The patient's history has been reviewed, patient examined, no change in status, stable for surgery.  I have reviewed the patient's chart and labs.  Questions were answered to the patient's satisfaction.    The recent H&P (dated *08/28/14**) was reviewed, the patient was examined and there is no change in the patients condition since that H&P was completed.   [name]  08/29/2014, 9:20 AM    Melvia Heapsobert [name]

## 2014-08-29 NOTE — Evaluation (Signed)
Occupational Therapy Evaluation Patient Details Name: Kaitlyn Burns MRN: [id number] DOB: September 16, 1924 Today's Date: 08/29/2014    History of Present Illness This 80 y.o. female admitted with AMS.  She was found to have AKI, elevated troponin and hypotensive.  Dx: Acute Encephalopathy (mulitfactorial including anemia, AKI, dehydration, and possibly ETOH withdrawal and wernicke's encephalopathy.  She underwent colonoscopy which showed ischemic colitis.  PMH: ETOH abuse; HTN; Hypothyroidism.    Clinical Impression   Pt admitted with above. She demonstrates the below listed deficits and will benefit from continued OT to maximize safety and independence with BADLs.  Pt presents to OT with cognitive deficits  (including orientation, problem solving, attention), generalized weakness, tremors bil. UEs; ideational apraxia.  She requires total A for simple grooming tasks, and was unable to stand with total A +1 - complained of burning in both knees.   SHe followed one step motor commands ~ 50% of time.  Anticipate she will require SNF, unless ALF is able to provede max - total A care.   Will follow acutely.       Follow Up Recommendations  SNF    Equipment Recommendations  None recommended by OT    Recommendations for Other Services       Precautions / Restrictions Precautions Precautions: Fall      Mobility Bed Mobility Overal bed mobility: Needs Assistance Bed Mobility: Supine to Sit;Sit to Supine     Supine to sit: Mod assist Sit to supine: Max assist   General bed mobility comments: She requires increased time and assist to move LEs and trunk to EOB and back into bed   Transfers Overall transfer level: Needs assistance Equipment used: 1 person hand held assist Transfers: Sit to/from Stand Sit to Stand: Total assist         General transfer comment: Attempted to stand.  Pt screamed "my knees are burning and did not attempt to assist. Unable to lift buttocks from bed      Balance Overall balance assessment: Needs assistance Sitting-balance support: Feet supported Sitting balance-Leahy Scale: Poor Sitting balance - Comments: loses balance posteriorly  Postural control: Posterior lean                                  ADL Overall ADL's : Needs assistance/impaired Eating/Feeding: Total assistance;Bed level   Grooming: Wash/dry hands;Wash/dry face;Oral care;Brushing hair;Total assistance;Bed level   Upper Body Bathing: Total assistance;Sitting;Bed level   Lower Body Bathing: Total assistance;Bed level   Upper Body Dressing : Total assistance;Sitting;Bed level   Lower Body Dressing: Total assistance;Bed level   Toilet Transfer: Total assistance Toilet Transfer Details (indicate cue type and reason): unable Toileting- Clothing Manipulation and Hygiene: Total assistance;Bed level       Functional mobility during ADLs: Total assistance (Pt unable to move sit to stand ) General ADL Comments: Pt unable to identify toothbrush - states you pull your boots on with it.  When asked to brush her teeth with it, she moved it to chest level and moved it back and forth briefly.  She required hand over hand total A to brush teeth      Vision                 Additional Comments: vision not tested as pt unable to sustain attention   Perception     Praxis Praxis Praxis tested?: Deficits Deficits: Initiation;Ideation;Perseveration Praxis-Other Comments: unable to identify toothbrush or demonstrate appropriate  use - states it's to pull your boots on with, and tried to put her "boots" on with it.  She was noted to perseverate and requires mod cues for initiation     Pertinent Vitals/Pain Pain Assessment: Faces Faces Pain Scale: Hurts little more Pain Location: knees; ? Rt UE Pain Descriptors / Indicators: Grimacing;Guarding;Burning Pain Intervention(s): Repositioned (RN notified)     Hand Dominance Right   Extremity/Trunk Assessment  Upper Extremity Assessment Upper Extremity Assessment: Generalized weakness;RUE deficits/detail;LUE deficits/detail RUE Deficits / Details: tremors noted bil. UEs;strength grossly 2+/5 throughout.  Edema noted bil.  LUE Deficits / Details: tremors noted bil. UEs;strength grossly 2+/5 throughout.  Edema noted bil.    Lower Extremity Assessment Lower Extremity Assessment: Defer to PT evaluation   Cervical / Trunk Assessment Cervical / Trunk Assessment: Kyphotic   Communication Communication Communication: HOH;Expressive difficulties   Cognition Arousal/Alertness: Lethargic Behavior During Therapy: Anxious;Flat affect Overall Cognitive Status: Impaired/Different from baseline Area of Impairment: Orientation;Attention;Following commands;Safety/judgement;Awareness;Problem solving Orientation Level: Disoriented to;Place;Time;Situation Current Attention Level: Focused Memory: Decreased short-term memory Following Commands: Follows one step commands inconsistently;Follows one step commands with increased time Safety/Judgement: Decreased awareness of safety;Decreased awareness of deficits   Problem Solving: Slow processing;Decreased initiation;Difficulty sequencing;Requires verbal cues;Requires tactile cues General Comments: Pt is oriented to person, and December.  Unable to provide further info. She frequently grunts and moans and talks with clenced teeth as if she is moaning.  Pt followed one step motor commands ~50% of the time. with cues    General Comments       Exercises       Shoulder Instructions      Home Living Family/patient expects to be discharged to:: Assisted living                                 [facility]       Prior Functioning/Environment Level of Independence: Needs assistance  Gait / Transfers Assistance Needed: Per chart review, pt was non ambulatory - uses a w/c, but was able to transfer bed <> w/c  ADL's  / Homemaking Assistance Needed: no info in chart re: ADL function         OT Diagnosis: Generalized weakness;Cognitive deficits;Acute pain;Apraxia   OT Problem List: Decreased strength;Decreased activity tolerance;Impaired balance (sitting and/or standing);Decreased coordination;Decreased cognition;Decreased safety awareness;Decreased knowledge of use of DME or AE;Pain   OT Treatment/Interventions: Self-care/ADL training;Neuromuscular education;DME and/or AE instruction;Therapeutic activities;Cognitive remediation/compensation;Patient/family education;Balance training    OT Goals(Current goals can be found in the care plan section) Acute Rehab OT Goals OT Goal Formulation: Patient unable to participate in goal setting Time For Goal Achievement: 09/12/14 Potential to Achieve Goals: Fair ADL Goals Pt Will Perform Grooming: with min assist;sitting Pt Will Perform Upper Body Bathing: with min assist;sitting Pt Will Transfer to Toilet: with mod assist;stand pivot transfer;bedside commode Pt Will Perform Toileting - Clothing Manipulation and hygiene: with mod assist;sit to/from stand Additional ADL Goal #1: Pt will sustain attention  3 mins during simple ADL tasks   OT Frequency: Min 2X/week   Barriers to D/C: Decreased caregiver support  lived in ALF        Co-evaluation              End of Session Equipment Utilized During Treatment: Oxygen Nurse Communication: Mobility status  Activity Tolerance: Patient limited by pain;Patient limited by lethargy Patient left: in bed;with call bell/phone within reach;with bed  alarm set   Time: [id number] OT Time Calculation (min): 22 min Charges:  OT General Charges $OT Visit: 1 Procedure OT Evaluation $Initial OT Evaluation Tier I: 1 Procedure OT Treatments $Therapeutic Activity: 8-22 mins G-Codes:    Letetia Romanello M 08/29/2014, 4:58 PM

## 2014-08-29 NOTE — Op Note (Signed)
[facility] [address] [place], [id number]   COLONOSCOPY PROCEDURE REPORT     EXAM DATE: 08/29/2014  PATIENT NAME:      Kaitlyn Burns, Kaitlyn Burns           MR #:      [id number] BIRTHDATE:       July 28, 1925      VISIT #:     [id number]  ATTENDING:     Louis Meckelobert D Kaplan, MD     STATUS:     inpatient REFERRING MD: ASA CLASS:        Class III  INDICATIONS:  The patient is a 80 yr old female here for a colonoscopy due to hematochezia. PROCEDURE PERFORMED:     Colonoscopy with biopsy MEDICATIONS:     Versed 0.5 mg IV ESTIMATED BLOOD LOSS:     None  CONSENT: The patient understands the risks and benefits of the procedure and understands that these risks include, but are not limited to: sedation, allergic reaction, infection, perforation and/or bleeding. Alternative means of evaluation and treatment include, among others: physical exam, x-rays, and/or surgical intervention. The patient elects to proceed with this endoscopic procedure.  DESCRIPTION OF PROCEDURE: During intra-op preparation period all mechanical & medical equipment was checked for proper function. Hand hygiene and appropriate measures for infection prevention was taken. After the risks, benefits and alternatives of the procedure were thoroughly explained, Informed consent was verified, confirmed and timeout was successfully executed by the treatment team. A digital exam revealed no abnormalities of the rectum.      The EC-[id number] ([id number]) endoscope was introduced through the anus and advanced to the cecum, which was identified by both the appendix and ileocecal valve. No adverse events experienced. The prep was Miralax fair. The instrument was then slowly withdrawn as the colon was fully examined.   COLON FINDINGS: There was moderate diverticulosis noted in the descending colon and sigmoid colon with associated muscular hypertrophy.   Internal hemorrhoids were found.   Abnormal mucosa was found at the  cecum and in the ascending colon.  The mucosa was oozing blood, erythematous and friable.  Multiple biopsies were performed. in the more distal colon including the transverse colon there is mild, faint erythema.  The remainder of the colon was normal with the exception of diverticulosis. Retroflexed views revealed no abnormalities.  The scope was then completely withdrawn from the patient and the procedure terminated. WITHDRAWAL TIME: 11 minutes 0 seconds    ADVERSE EVENTS:      There were no immediate complications.  IMPRESSIONS:     1.  There was moderate diverticulosis noted in the descending colon and sigmoid colon 2.  Internal hemorrhoids 3.  colitis - possible ischemic lightness with friable mucosa  RECOMMENDATIONS:     Await biopsy results RECALL:  Louis Meckelobert D Kaplan, MD eSigned:  Louis Meckelobert D Kaplan, MD 08/29/2014 10:34 AM   cc:  [name], MD  CPT CODES: ICD CODES:  The ICD and CPT codes recommended by this software are interpretations from the data that the clinical staff has captured with the software.  The verification of the translation of this report to the ICD and CPT codes and modifiers is the sole responsibility of the health care institution and practicing physician where this report was generated.  [employer] will not be held responsible for the validity of the ICD and CPT codes included on this report.  AMA assumes no liability for data contained or not contained herein. CPT  is a Publishing rights managerregistered trademark of the [employer].   PATIENT NAME:  Kaitlyn Burns, Kaitlyn Burns MR#: [id number]

## 2014-08-29 NOTE — Progress Notes (Signed)
Patient currently in Endoscopy and was called to make aware of potassium of 2.8. Text-paged MD Abrol to make aware.

## 2014-08-29 NOTE — Progress Notes (Signed)
Received from SDU. Patient moans and groans, does not really communicate. Agree with previous RNs assessment.

## 2014-08-30 ENCOUNTER — Encounter (HOSPITAL_COMMUNITY): Payer: Self-pay | Admitting: Gastroenterology

## 2014-08-30 DIAGNOSIS — K551 Chronic vascular disorders of intestine: Secondary | ICD-10-CM | POA: Diagnosis not present

## 2014-08-30 LAB — CBC
HCT: 31.2 % — ABNORMAL LOW (ref 36.0–46.0)
HEMOGLOBIN: 10.1 g/dL — AB (ref 12.0–15.0)
MCH: 32.5 pg (ref 26.0–34.0)
MCHC: 32.4 g/dL (ref 30.0–36.0)
MCV: 100.3 fL — AB (ref 78.0–100.0)
Platelets: 215 10*3/uL (ref 150–400)
RBC: 3.11 MIL/uL — ABNORMAL LOW (ref 3.87–5.11)
RDW: 15 % (ref 11.5–15.5)
WBC: 12.3 10*3/uL — ABNORMAL HIGH (ref 4.0–10.5)

## 2014-08-30 LAB — GLUCOSE, CAPILLARY
GLUCOSE-CAPILLARY: 95 mg/dL (ref 70–99)
GLUCOSE-CAPILLARY: 123 mg/dL — AB (ref 70–99)
Glucose-Capillary: 108 mg/dL — ABNORMAL HIGH (ref 70–99)
Glucose-Capillary: 139 mg/dL — ABNORMAL HIGH (ref 70–99)
Glucose-Capillary: 141 mg/dL — ABNORMAL HIGH (ref 70–99)
Glucose-Capillary: 153 mg/dL — ABNORMAL HIGH (ref 70–99)
Glucose-Capillary: 199 mg/dL — ABNORMAL HIGH (ref 70–99)
Glucose-Capillary: 65 mg/dL — ABNORMAL LOW (ref 70–99)

## 2014-08-30 LAB — COMPREHENSIVE METABOLIC PANEL
ALK PHOS: 106 U/L (ref 39–117)
ALT: 33 U/L (ref 0–35)
AST: 29 U/L (ref 0–37)
Albumin: 2.4 g/dL — ABNORMAL LOW (ref 3.5–5.2)
Anion gap: 5 (ref 5–15)
BUN: 14 mg/dL (ref 6–23)
CO2: 21 mmol/L (ref 19–32)
Calcium: 8.2 mg/dL — ABNORMAL LOW (ref 8.4–10.5)
Chloride: 120 mEq/L — ABNORMAL HIGH (ref 96–112)
Creatinine, Ser: 0.79 mg/dL (ref 0.50–1.10)
GFR calc Af Amer: 84 mL/min — ABNORMAL LOW (ref >90)
GFR, EST NON AFRICAN AMERICAN: 73 mL/min — AB (ref >90)
Glucose, Bld: 140 mg/dL — ABNORMAL HIGH (ref 70–99)
POTASSIUM: 2.7 mmol/L — AB (ref 3.5–5.1)
SODIUM: 146 mmol/L — AB (ref 135–145)
Total Bilirubin: 0.4 mg/dL (ref 0.3–1.2)
Total Protein: 5 g/dL — ABNORMAL LOW (ref 6.0–8.3)

## 2014-08-30 LAB — BLOOD GAS, ARTERIAL
ACID-BASE DEFICIT: 4.4 mmol/L — AB (ref 0.0–2.0)
Bicarbonate: 20.7 mEq/L (ref 20.0–24.0)
DRAWN BY: 31814
FIO2: 0.32 %
O2 SAT: 95 %
PO2 ART: 72 mmHg — AB (ref 80.0–100.0)
Patient temperature: 97.5
TCO2: 19.6 mmol/L (ref 0–100)
pCO2 arterial: 39.8 mmHg (ref 35.0–45.0)
pH, Arterial: 7.333 — ABNORMAL LOW (ref 7.350–7.450)

## 2014-08-30 LAB — CLOSTRIDIUM DIFFICILE BY PCR: Toxigenic C. Difficile by PCR: NEGATIVE

## 2014-08-30 MED ORDER — POTASSIUM CHLORIDE CRYS ER 20 MEQ PO TBCR
40.0000 meq | EXTENDED_RELEASE_TABLET | Freq: Three times a day (TID) | ORAL | Status: DC
  Administered 2014-08-30: 40 meq via ORAL
  Filled 2014-08-30: qty 2

## 2014-08-30 MED ORDER — POTASSIUM CHLORIDE 10 MEQ/100ML IV SOLN
10.0000 meq | INTRAVENOUS | Status: AC
  Administered 2014-08-30 (×4): 10 meq via INTRAVENOUS
  Filled 2014-08-30 (×5): qty 100

## 2014-08-30 MED ORDER — PANTOPRAZOLE SODIUM 40 MG IV SOLR
40.0000 mg | INTRAVENOUS | Status: DC
  Administered 2014-08-31 – 2014-09-01 (×2): 40 mg via INTRAVENOUS
  Filled 2014-08-30 (×3): qty 40

## 2014-08-30 MED ORDER — POTASSIUM CHLORIDE CRYS ER 20 MEQ PO TBCR
40.0000 meq | EXTENDED_RELEASE_TABLET | Freq: Three times a day (TID) | ORAL | Status: AC
Start: 2014-08-30 — End: 2014-09-01
  Administered 2014-08-30 – 2014-08-31 (×4): 40 meq via ORAL
  Filled 2014-08-30 (×6): qty 2

## 2014-08-30 MED ORDER — OXYCODONE HCL 5 MG PO TABS
5.0000 mg | ORAL_TABLET | Freq: Four times a day (QID) | ORAL | Status: DC | PRN
  Administered 2014-08-30 – 2014-09-03 (×8): 5 mg via ORAL
  Filled 2014-08-30 (×9): qty 1

## 2014-08-30 MED ORDER — POTASSIUM CHLORIDE 10 MEQ/100ML IV SOLN
10.0000 meq | INTRAVENOUS | Status: AC
  Administered 2014-08-30: 10 meq via INTRAVENOUS
  Filled 2014-08-30 (×5): qty 100

## 2014-08-30 NOTE — Progress Notes (Addendum)
PROGRESS NOTE  Kaitlyn Burns ZOX:[id number] DOB: 08-15-1925 DOA: 08/26/2014 PCP: Brooke Bonito, MD    Brief history  80 year old female with a history of diabetes mellitus, CKD stage II-III, hypertension, hypothyroidism presented from [facility] with altered mental status. Unfortunately, the patient is unable to provide any significant history. I spoke with the patient's son, [name].  He revealed to me that the patient has a long history of alcohol dependence. More recently, the patient has been drinking one to 2 glasses of wine daily. At her assisted living facility, they are allowed to keep her own alcohol in the room.  At baseline, the patient is able to make transfers from the bed to her wheelchair with which she gets around. Unfortunately, the patient has much difficulty ambulating at baseline. The patient was noted to have acute kidney injury with elevated serum creatinine and elevated point-of-care troponin in the emergency department. In addition, fecal occult blood test was also positive. The patient was also noted to have relative hypotension with systolic blood pressures in the mid 90s. Notably, the patient was admitted in January 2015 with significant anemia with hemoglobin of 4.8. She has not had any endoscopy or GI follow-up. During her January admission, the patient was given 3 units PRBC.    Assessment/Plan: Acute encephalopathy, most likely secondary to alcohol withdrawal, patient tachycardic, hypertensive -Multifactorial including anemia, acute kidney injury, dehydration and possibly alcohol withdrawal and Wernicke's encephalopathy -Check for reversible causes of encephalopathy including ammonia level which was 50, vitamin B-12 1213 -TSH--0.123 , will recheck ammonia level again tomorrow Continue thiamine supplementation -CIWA scale CT head negative Speech therapy evaluation, PT/OT eval Repeat ABG shows continued metabolic acidosis but slowly improving, metabolic  acidosis probably secondary to alcohol use   Anemia with positive FOBT/melena -FOBT was positive in the emergency department -I have performed rectal exam on 12/21-->melena and positive FOBT again Hemoglobin stable -08/27/2014 EGD--negative Colonoscopy shows possible ischemic colitis -hold ASA for now, avoid NSAIDs -continue protonix , GI has signed off    Alcohol dependence /narcotic dependence Extensive discussion with the son, concern about overuse of narcotics along with alcohol Patient may be in mild withdrawal from alcohol as well as narcotics, We will minimize narcotics in the hospital Discontinue Dilaudid  Sinus tachycardia -may be rebound from being off diltiazem vs Etoh/opioid withdraw -TSH 0.123--although a little suppressed, suspect sick euthyroid syndrome -continue IVF Restart diltiazem  Acute on chronic renal failure (CKD2-3) -baseline creatinine 0.9-1.2 -renal us--neg for hydronephrosis -continue IVF Renal function improving, restart lisinopril  Elevated point-of-care troponin/abnormal EKG -Cycle troponins--neg x 3 -no chest pain presently  Diabetes mellitus type 2 -Hemoglobin A1c--5.8 -NovoLog sliding scale  Leukocytosis -suspect stress demargination, improved without any abx -Urinalysis without pyuria -Chest x-ray negative for infiltrates  -blood cultures 2 sets--negative today - as the patient's blood pressure is stable and she is afebrile, we'll hold off on starting antibiotics for now and monitor clinically    Hypothyroidism  -TSH is 0.123 , free T4 is normal -Continue Synthroid   Relative hypotension  -improving with IVF Restart antihypertensive medications   Hypokalemia Replete potassium Magnesium 1.5, replete  Family Communication:discussed with son [name] on phone Disposition Plan: PT/OT/speech     Procedures/Studies: Ct Abdomen Pelvis Wo Contrast  08/26/2014   CLINICAL DATA:  Altered mental status, unable to void   EXAM: CT ABDOMEN AND PELVIS WITHOUT CONTRAST  TECHNIQUE: Multidetector CT imaging of the abdomen and pelvis was performed following the standard  protocol without IV contrast.  COMPARISON:  01/28/2010  FINDINGS: Dependent bibasilar atelectasis noted.  Unenhanced liver, adrenal glands, spleen, and pancreas are unremarkable. Motion artifact degrades imaging. Exophytic right lower renal pole 0.8 cm probable hyperdense cyst is identified, unchanged from the prior exam. Sub cm low-density bilateral renal cortical lesions elsewhere are reidentified but not further characterized on the current noncontrast exam. No free fluid or air. No lymphadenopathy.  Normal appendix, image 59. No bowel wall thickening or focal segmental dilatation is identified. Bladder is decompressed but unremarkable. Uterus not visualized. Ovaries are normal. Moderate atheromatous aortic calcification noted without aneurysm. Multilevel disc degenerative change noted in the spine.  IMPRESSION: No acute intra-abdominal or pelvic pathology.   Electronically Signed   By: Christiana PellantGretchen  Green M.D.   On: 08/26/2014 23:47   Ct Head Wo Contrast  08/26/2014   CLINICAL DATA:  Acute onset of confusion. Patient has not voided today. Initial encounter.  EXAM: CT HEAD WITHOUT CONTRAST  TECHNIQUE: Contiguous axial images were obtained from the base of the skull through the vertex without intravenous contrast.  COMPARISON:  CT of the head performed 01/17/2010  FINDINGS: There is no evidence of acute infarction, mass lesion, or intra- or extra-axial hemorrhage on CT.  Prominence of the ventricles and sulci reflects mild to moderate cortical volume loss. Cerebellar atrophy is noted. Mild periventricular and subcortical white matter change likely reflects small vessel ischemic microangiopathy.  The brainstem and fourth ventricle are within normal limits. The basal ganglia are unremarkable in appearance. The cerebral hemispheres demonstrate grossly normal gray-white  differentiation. No mass effect or midline shift is seen.  There is no evidence of fracture; visualized osseous structures are unremarkable in appearance. The orbits are within normal limits. Dense inspissated mucus is noted within the right maxillary sinus. The remaining paranasal sinuses and mastoid air cells are well-aerated. No significant soft tissue abnormalities are seen.  IMPRESSION: 1. No acute intracranial pathology seen on CT. 2. Mild to moderate cortical volume loss and scattered small vessel ischemic microangiopathy. 3. Dense inspissated mucus noted within the right maxillary sinus.   Electronically Signed   By: Roanna RaiderJeffery  Chang M.D.   On: 08/26/2014 23:46   [place]as Renal  08/27/2014   CLINICAL DATA:  Subsequent encounter for acute on chronic renal failure  EXAM: RENAL/URINARY TRACT ULTRASOUND COMPLETE  COMPARISON:  CT scan from 08/26/2014.  FINDINGS: Right Kidney:  Length: 10.6 cm. Poor acoustic window limited assessment. The low-density lesion seen on the CT scan yesterday could not be visualized by ultrasound today.  Left Kidney:  Length: 9.8 cm. Echogenicity within normal limits. No mass or hydronephrosis visualized.  Bladder:  Appears normal for degree of bladder distention.  IMPRESSION: Limited study secondary to poor acoustic window bilaterally. No hydronephrosis. Right renal cyst seen on CT scan yesterday not evident on ultrasound today.   Electronically Signed   By: Kennith CenterEric  Mansell M.D.   On: 08/27/2014 10:15   Dg Chest Portable 1 View  08/26/2014   CLINICAL DATA:  Altered mental status, diabetes  EXAM: PORTABLE CHEST - 1 VIEW  COMPARISON:  None  FINDINGS: Heart size is within normal limits. The aorta is unfolded and ectatic. No focal pulmonary opacity. Evidence of arose origin or remote resection of the distal right clavicle. No acute osseous abnormality. No pleural effusion.  IMPRESSION: No acute cardiopulmonary process.   Electronically Signed   By: Christiana PellantGretchen  Green M.D.   On: 08/26/2014 22:37         Subjective:  patient  intermittently follows commands. She is pleasantly confused. Unable to have a meaningful conversation on for any questions, cannot remember her date of birth  Objective: Filed Vitals:   08/29/14 1044 08/29/14 1228 08/30/14 0010 08/30/14 0649  BP: 161/61 128/84 149/65 157/91  Pulse: 101 103 104 101  Temp:  98 F (36.7 C) 97.5 F (36.4 C) 97.8 F (36.6 C)  TempSrc:  Oral Oral Oral  Resp: 14 18 20 21   Height:      Weight:      SpO2: 97% 95% 100% 100%    Intake/Output Summary (Last 24 hours) at 08/30/14 1212 Last data filed at 08/30/14 [id number]  Gross per 24 hour  Intake   2280 ml  Output    800 ml  Net   1480 ml   Weight change:  Exam:   General:  Pt is alert, follows commands appropriately, not in acute distress  HEENT: No icterus, No thrush, No meningismus, [place]/AT  Cardiovascular: RRR, S1/S2, no rubs, no gallops  Respiratory: poor inspiratory effort but clear to auscultation   Abdomen: Soft/+BS, non tender, non distended, no guarding  Extremities: No edema, No lymphangitis, No petechiae, No rashes, no synovitis  Data Reviewed: Basic Metabolic Panel:  Recent Labs Lab 08/26/14 2134 08/27/14 0223 08/28/14 0510 08/29/14 0540 08/30/14 0415  NA 133* 136* 139 147* 146*  K 3.6* 3.9 3.2* 2.8* 2.7*  CL 95* 103 115* 123* 120*  CO2 18* 16* 15* 16* 21  GLUCOSE 117* 95 70 90 140*  BUN 34* 31* 30* 20 14  CREATININE 2.98* 2.56* 1.65* 0.91 0.79  CALCIUM 9.5 8.3* 7.8* 8.1* 8.2*  MG  --   --   --  1.5  --    Liver Function Tests:  Recent Labs Lab 08/26/14 2134 08/30/14 0415  AST 28 29  ALT 26 33  ALKPHOS 152* 106  BILITOT 0.2* 0.4  PROT 6.3 5.0*  ALBUMIN 2.9* 2.4*   No results for input(s): LIPASE, AMYLASE in the last 168 hours.  Recent Labs Lab 08/28/14 1833  AMMONIA 50*   CBC:  Recent Labs Lab 08/26/14 2134 08/27/14 0223  08/28/14 0125 08/28/14 0510 08/28/14 1106 08/29/14 0540 08/30/14 0415  WBC 18.0* 13.5*  --    --  9.9  --  11.4* 12.3*  NEUTROABS 13.9*  --   --   --   --   --   --   --   HGB 11.1* 11.3*  < > 9.8* 10.1* 9.7* 10.0* 10.1*  HCT 33.9* 35.5*  < > 30.6* 31.2* 29.6* 30.6* 31.2*  MCV 101.8* 102.9*  --   --  102.0*  --  100.0 100.3*  PLT 295 232  --   --  184  --  179 215  < > = values in this interval not displayed. Cardiac Enzymes:  Recent Labs Lab 08/27/14 0223 08/27/14 0755 08/27/14 1050 08/27/14 1313 08/27/14 1715 08/27/14 2255 08/28/14 0510 08/28/14 1106  CKTOTAL 61  --  54  --  54 49 35 44  CKMB 12.2*  --  9.6*  --  9.2* 12.2* 12.6* 10.8*  TROPONINI <0.30 <0.30  --  <0.30  --   --   --   --    BNP: Invalid input(s): POCBNP CBG:  Recent Labs Lab 08/29/14 2030 08/30/14 0006 08/30/14 0359 08/30/14 0727 08/30/14 0802  GLUCAP 116* 153* 139* 65* 95    Recent Results (from the past 240 hour(s))  MRSA PCR Screening     Status: None  Collection Time: 08/27/14  1:56 AM  Result Value Ref Range Status   MRSA by PCR NEGATIVE NEGATIVE Final    Comment:        The GeneXpert MRSA Assay (FDA approved for NASAL specimens only), is one component of a comprehensive MRSA colonization surveillance program. It is not intended to diagnose MRSA infection nor to guide or monitor treatment for MRSA infections.   Culture, blood (routine x 2)     Status: None (Preliminary result)   Collection Time: 08/27/14 10:50 AM  Result Value Ref Range Status   Specimen Description BLOOD LEFT ARM  Final   Special Requests BOTTLES DRAWN AEROBIC AND ANAEROBIC [facility]  Final   Culture  Setup Time   Final    08/27/2014 13:22 Performed at [facility]    Culture   Final           BLOOD CULTURE RECEIVED NO GROWTH TO DATE CULTURE WILL BE HELD FOR 5 DAYS BEFORE ISSUING A FINAL NEGATIVE REPORT Performed at [facility]    Report Status PENDING  Incomplete  Culture, blood (routine x 2)     Status: None (Preliminary result)   Collection Time: 08/27/14 10:55 AM  Result Value Ref  Range Status   Specimen Description BLOOD RIGHT ARM  Final   Special Requests BOTTLES DRAWN AEROBIC ONLY 5CC  Final   Culture  Setup Time   Final    08/27/2014 13:22 Performed at [facility]    Culture   Final           BLOOD CULTURE RECEIVED NO GROWTH TO DATE CULTURE WILL BE HELD FOR 5 DAYS BEFORE ISSUING A FINAL NEGATIVE REPORT Performed at [facility]    Report Status PENDING  Incomplete     Scheduled Meds: . allopurinol  100 mg Oral BID  . diltiazem  240 mg Oral Daily  . feeding supplement (RESOURCE BREEZE)  1 Container Oral TID BM  . folic acid  1 mg Oral Daily  . insulin aspart  0-9 Units Subcutaneous 6 times per day  . latanoprost  1 drop Both Eyes QHS  . levothyroxine  75 mcg Oral QAC breakfast  . lisinopril  2.5 mg Oral Daily  . multivitamin with minerals  1 tablet Oral Daily  . [START ON 08/31/2014] pantoprazole (PROTONIX) IV  40 mg Intravenous Q24H  . potassium chloride  40 mEq Oral TID  . thiamine IV  500 mg Intravenous TID   Continuous Infusions: .  sodium bicarbonate infusion 1/4 NS 1000 mL 75 mL/hr at 08/30/14 0224     Richarda Overlie DO  [facility] Pager [phone]  If 7PM-7AM, please contact night-coverage [email or web address] Password [email or web address] 08/30/2014, 12:12 PM   LOS: 4 days

## 2014-08-30 NOTE — Progress Notes (Signed)
Patient  anxious and very restless, trying to climbed out of bed , holding on to the IV pole.  PRN meds given.

## 2014-08-30 NOTE — Evaluation (Signed)
Physical Therapy Evaluation Patient Details Name: Kaitlyn Burns MRN: [id number] DOB: 01/05/1925 Today's Date: 08/30/2014   History of Present Illness  This 80 y.o. female admitted with AMS.  She was found to have AKI, elevated troponin and hypotensive.  Dx: Acute Encephalopathy (mulitfactorial including anemia, AKI, dehydration, and possibly ETOH withdrawal and wernicke's encephalopathy.  She underwent colonoscopy which showed ischemic colitis.  PMH: ETOH abuse; HTN; Hypothyroidism.   Clinical Impression  On eval, pt requried Mod assist for bed mobility and Max assist for squat pivot to recliner. Pt unable to stand due to R knee pain. Recommend SNF.     Follow Up Recommendations SNF;Supervision/Assistance - 24 hour (unless ALF facility feels they can manage pt at current level)    Equipment Recommendations  None recommended by PT    Recommendations for Other Services OT consult     Precautions / Restrictions Precautions Precautions: Fall Restrictions Weight Bearing Restrictions: No      Mobility  Bed Mobility Overal bed mobility: Needs Assistance Bed Mobility: Supine to Sit     Supine to sit: Mod assist;HOB elevated     General bed mobility comments: Assist for trunk to upight and some assist for LEs. Utilized bedpad for scooting, positioning. Guided pt's hand to bedrails for her to assist as much as possible.  Transfers Overall transfer level: Needs assistance   Transfers: Squat Pivot Transfers     Squat pivot transfers: Max assist     General transfer comment: Attempted stand-pt was unable . Performed squat pivot, bed to recliner-Max assist. Mod assist to scoot back into chair.   Ambulation/Gait                Stairs            Wheelchair Mobility    Modified Rankin (Stroke Patients Only)       Balance Overall balance assessment: Needs assistance Sitting-balance support: Bilateral upper extremity supported;Feet supported Sitting balance-Leahy  Scale: Fair                                       Pertinent Vitals/Pain Pain Assessment: Faces Faces Pain Scale: Hurts even more Pain Location: R knee with mobilizing Pain Descriptors / Indicators: Guarding;Grimacing Pain Intervention(s): Limited activity within patient's tolerance;Repositioned    Home Living Family/patient expects to be discharged to:: Unsure     Type of Home: Assisted living Home Access: Level entry     Home Layout: One level   Additional [facility]     Prior Function Level of Independence: Needs assistance   Gait / Transfers Assistance Needed: Per chart review, pt was non ambulatory - uses a w/c, but was able to transfer bed <> w/c   ADL's / Homemaking Assistance Needed: no info in chart re: ADL function         Hand Dominance        Extremity/Trunk Assessment   Upper Extremity Assessment: Defer to OT evaluation           Lower Extremity Assessment: Generalized weakness;RLE deficits/detail RLE Deficits / Details: pt reports chronic R knee pain that affects mobility    Cervical / Trunk Assessment: Kyphotic  Communication   Communication: Expressive difficulties  Cognition Arousal/Alertness: Awake/alert Behavior During Therapy: Flat affect Overall Cognitive Status: No family/caregiver present to determine baseline cognitive functioning   Orientation Level: Disoriented to;Place;Time;Situation Current Attention Level: Focused Memory: Decreased  short-term memory Following Commands: Follows one step commands with increased time Safety/Judgement: Decreased awareness of safety;Decreased awareness of deficits   Problem Solving: Requires verbal cues;Requires tactile cues      General Comments      Exercises        Assessment/Plan    PT Assessment Patient needs continued PT services  PT Diagnosis Generalized weakness;Altered mental status   PT Problem List Decreased strength;Decreased  range of motion;Decreased activity tolerance;Decreased balance;Decreased mobility;Decreased cognition;Decreased knowledge of use of DME;Decreased safety awareness;Pain  PT Treatment Interventions DME instruction;Functional mobility training;Therapeutic activities;Therapeutic exercise;Patient/family education;Balance training   PT Goals (Current goals can be found in the Care Plan section) Acute Rehab PT Goals Patient Stated Goal: none stated PT Goal Formulation: Patient unable to participate in goal setting Time For Goal Achievement: 09/13/14 Potential to Achieve Goals: Fair    Frequency Min 3X/week   Barriers to discharge        Co-evaluation               End of Session Equipment Utilized During Treatment: Gait belt Activity Tolerance: Patient tolerated treatment well Patient left: in chair;with call bell/phone within reach;with chair alarm set           Time: [id number] PT Time Calculation (min) (ACUTE ONLY): 25 min   Charges:     PT Treatments $Therapeutic Activity: 23-37 mins   PT G Codes:          Kaitlyn Burns, MPT Pager: [phone]

## 2014-08-30 NOTE — Progress Notes (Signed)
INITIAL NUTRITION ASSESSMENT  DOCUMENTATION CODES Per approved criteria  -Not Applicable   INTERVENTION: -Continue Resource Breeze po TID, each supplement provides 250 kcal and 9 grams of protein -RD to continue to monitor  NUTRITION DIAGNOSIS: Inadequate oral intake related to altered mental status as evidenced by per observation.   Goal: Pt to meet >/= 90% of their estimated nutrition needs   Monitor:  PO and supplemental intake, weight, labs, I/O's  Reason for Assessment: Pt identified as at nutrition risk on the Malnutrition Screen Tool  Admitting Dx: Rectal bleeding  ASSESSMENT: 80 year old female with a history of diabetes mellitus, CKD stage II-III, hypertension, hypothyroidism [facility] with altered mental status. Unfortunately, the patient is unable to provide any significant history. Patient has a long history of alcohol dependence.   Pt in room asleep, no family in room. Pt with jello and Resource Breeze on tray.   Per documentation, pt is drinking Stage manageresource Breeze supplement. Per observation, pt is not consuming >50% of liquid trays.   Per weight history documentation, pt has lost 16 lb since January (11% weight loss x 11 months, insignificant for time frame).  Labs reviewed: Elevated Na Low K  Height: Ht Readings from Last 1 Encounters:  08/27/14 5\' 1"  (1.549 m)    Weight: Wt Readings from Last 1 Encounters:  08/27/14 132 lb 4.4 oz (60 kg)    Ideal Body Weight: 105 lb  % Ideal Body Weight: 126%  Wt Readings from Last 10 Encounters:  08/27/14 132 lb 4.4 oz (60 kg)  09/13/13 148 lb 6.4 oz (67.314 kg)    Usual Body Weight: Unable to obtain  % Usual Body Weight: NA  BMI:  Body mass index is 25.01 kg/(m^2).  Estimated Nutritional Needs: Kcal: 1500-1700 Protein: 70-80g Fluid: 1.5L/day  Skin: Stg 1 pressure ulcer  Diet Order: Diet clear liquid  EDUCATION NEEDS: -No education needs identified at this  time   Intake/Output Summary (Last 24 hours) at 08/30/14 1103 Last data filed at 08/30/14 0700  Gross per 24 hour  Intake   1880 ml  Output    900 ml  Net    980 ml    Last BM:  12/22  Labs:   Recent Labs Lab 08/28/14 0510 08/29/14 0540 08/30/14 0415  NA 139 147* 146*  K 3.2* 2.8* 2.7*  CL 115* 123* 120*  CO2 15* 16* 21  BUN 30* 20 14  CREATININE 1.65* 0.91 0.79  CALCIUM 7.8* 8.1* 8.2*  MG  --  1.5  --   GLUCOSE 70 90 140*    CBG (last 3)   Recent Labs  08/30/14 0359 08/30/14 0727 08/30/14 0802  GLUCAP 139* 65* 95    Scheduled Meds: . allopurinol  100 mg Oral BID  . diltiazem  240 mg Oral Daily  . feeding supplement (RESOURCE BREEZE)  1 Container Oral TID BM  . folic acid  1 mg Oral Daily  . insulin aspart  0-9 Units Subcutaneous 6 times per day  . latanoprost  1 drop Both Eyes QHS  . levothyroxine  75 mcg Oral QAC breakfast  . lisinopril  2.5 mg Oral Daily  . multivitamin with minerals  1 tablet Oral Daily  . [START ON 08/31/2014] pantoprazole (PROTONIX) IV  40 mg Intravenous Q24H  . potassium chloride  10 mEq Intravenous Q1 Hr x 5  . potassium chloride  40 mEq Oral TID  . thiamine IV  500 mg Intravenous TID    Continuous Infusions: .  sodium bicarbonate infusion 1/4 NS 1000 mL 75 mL/hr at 08/30/14 [id number]    Past Medical History  Diagnosis Date  . Diabetes mellitus   . Thyroid disease   . Complication of anesthesia     trouble intubating surgery  . Difficult intubation   . Arthritis     Past Surgical History  Procedure Laterality Date  . Abdominal hysterectomy    . Hemorroidectomy    . Medial partial knee replacement    . Thyroidectomy, partial    . Shoulder surgery Right     "had shredded a muscle"  . Achilles tendon surgery Left   . Foot surgery Left     left heel infection that went to bone per spouse  . Lower abdominel surgery    . Esophagogastroduodenoscopy (egd) with propofol N/A 08/28/2014    Procedure:  ESOPHAGOGASTRODUODENOSCOPY (EGD) WITH PROPOFOL;  Surgeon: Kaitlyn Meckelobert D Kaplan, MD;  Location: [facility];  Service: Endoscopy;  Laterality: N/A;  . Colonoscopy N/A 08/29/2014    Procedure: COLONOSCOPY;  Surgeon: Kaitlyn Meckelobert D Kaplan, MD;  Location: [facility];  Service: Endoscopy;  Laterality: N/A;    Kaitlyn FrancoLindsey Fleeta Kunde, MS, RD, LDN Pager: [phone] After Hours Pager: [phone]

## 2014-08-30 NOTE — Progress Notes (Signed)
     [facility] Progress Note  Subjective:  Patient follows commands but no meaningful conversation due to confusion.  Objective:  Vital signs in last 24 hours: Temp:  [97.5 F (36.4 C)-98 F (36.7 C)] 97.8 F (36.6 C) (12/24 0649) Pulse Rate:  [37-113] 101 (12/24 0649) Resp:  [12-32] 21 (12/24 0649) BP: (128-181)/(56-128) 157/91 mmHg (12/24 0649) SpO2:  [91 %-100 %] 100 % (12/24 0649) Last BM Date: 08/28/14 General:  Alert, elderly and frail, in NAD Heart:  Regular rate and rhythm Pulm:  CTAB. Abdomen:  Soft, non-distended.  BS present.  Non-tender.  Extremities:  Without edema.  Intake/Output from previous day: 12/23 0701 - 12/24 0700 In: 1961.3 [P.O.:240; I.V.:1421.3; IV Piggyback:300] Out: 1225 [Urine:1225]  Lab Results:  Recent Labs  08/28/14 0510 08/28/14 1106 08/29/14 0540 08/30/14 0415  WBC 9.9  --  11.4* 12.3*  HGB 10.1* 9.7* 10.0* 10.1*  HCT 31.2* 29.6* 30.6* 31.2*  PLT 184  --  179 215   BMET  Recent Labs  08/28/14 0510 08/29/14 0540 08/30/14 0415  NA 139 147* 146*  K 3.2* 2.8* 2.7*  CL 115* 123* 120*  CO2 15* 16* 21  GLUCOSE 70 90 140*  BUN 30* 20 14  CREATININE 1.65* 0.91 0.79  CALCIUM 7.8* 8.1* 8.2*   LFT  Recent Labs  08/30/14 0415  PROT 5.0*  ALBUMIN 2.4*  AST 29  ALT 33  ALKPHOS 106  BILITOT 0.4   Assessment / Plan: -80 year old female with black, FOBT positive stools x 2 upon admission, and history of severe anemia in 09/2013 with Hgb of 4.8 grams requiring 3 units of PRBC's. Hgb has been stable on this admission and is 10.1 grams this AM.  EGD normal on 10/22.  Colonoscopy 10/23 revealed diverticulosis in the left colon and segmental colitis in the very proximal colon (suspected ischemic colitis and likely source of bleeding).  Biopsies pending. -Altered mental status/acute encephalopathy: Multifactorial due to anemia, AKI, dehydration. -Hypokalemia  *Await biopsy results. *Monitor Hgb.    LOS: 4 days    ZEHR, JESSICA D.  08/30/2014, 9:32 AM  Pager number [phone]  GI Attending Note  I have personally taken an interval history, reviewed the chart, and examined the patient.  No further overt GI bleeding.  Await biopsy results.  She does not need any further GI workup.  I would try to avoid NSAIDs.  Signing off   Barbette Hairobert D. Arlyce DiceKaplan, MD, [facility] [phone]

## 2014-08-30 NOTE — Evaluation (Signed)
Clinical/Bedside Swallow Evaluation Patient Details  Name: Kaitlyn Burns MRN: [id number] Date of Birth: 11/06/1924  Today's Date: 08/30/2014 Time: 1110-1135 SLP Time Calculation (min) (ACUTE ONLY): 25 min  Past Medical History:  Past Medical History  Diagnosis Date  . Diabetes mellitus   . Thyroid disease   . Complication of anesthesia     trouble intubating surgery  . Difficult intubation   . Arthritis    Past Surgical History:  Past Surgical History  Procedure Laterality Date  . Abdominal hysterectomy    . Hemorroidectomy    . Medial partial knee replacement    . Thyroidectomy, partial    . Shoulder surgery Right     "had shredded a muscle"  . Achilles tendon surgery Left   . Foot surgery Left     left heel infection that went to bone per spouse  . Lower abdominel surgery    . Esophagogastroduodenoscopy (egd) with propofol N/A 08/28/2014    Procedure: ESOPHAGOGASTRODUODENOSCOPY (EGD) WITH PROPOFOL;  Surgeon: Louis Meckelobert D Kaplan, MD;  Location: [facility];  Service: Endoscopy;  Laterality: N/A;  . Colonoscopy N/A 08/29/2014    Procedure: COLONOSCOPY;  Surgeon: Louis Meckelobert D Kaplan, MD;  Location: [facility];  Service: Endoscopy;  Laterality: N/A;   HPI:  80 [facility] bleeding - suspected to have colitis per GI notes.   Recent Colonoscopy 10/23 revealed diverticulosis in the left colon and segmental colitis in the very proximal colon (suspected ischemic colitis and likely source of bleeding).  Recent EGD negative.  Swallow evaluation ordered.    Assessment / Plan / Recommendation Clinical Impression  Pt presents with functional oropharyngeal swallow ability based on clinical swallow evaluation.  No focal CN deficits nor did pt demonstrate symptoms of airway compromise with po intake.  She did demonstrate difficulty feeding herself but swallow ability is intact.  Recommend advance to regular/thin when GI MD indicates appropriate.  SLP to sign off, thanks for the  consult.      Aspiration Risk    Mild, pt denies reflux issues nor oropharyngeal dysphagia symtpoms   Diet Recommendation Regular;Thin liquid (when GI indicates appropriate)   Liquid Administration via: Straw;Cup Medication Administration: Whole meds with liquid Supervision: Staff to assist with self feeding Compensations: Slow rate;Small sips/bites Postural Changes and/or Swallow Maneuvers: Seated upright 90 degrees;Upright 30-60 min after meal    Other  Recommendations Oral Care Recommendations: Oral care BID   Follow Up Recommendations  None    Frequency and Duration   n/a     Pertinent Vitals/Pain Afebrile, decreased      Swallow Study Prior Functional Status  Type of Home: Assisted living    General Date of Onset: 08/30/14 HPI: 80 [facility] - suspected to have colitis per GI notes.   Recent Colonoscopy 10/23 revealed diverticulosis in the left colon and segmental colitis in the very proximal colon (suspected ischemic colitis and likely source of bleeding).  Recent EGD negative.  Swallow evaluation ordered.  Type of Study: Bedside swallow evaluation Diet Prior to this Study: Thin liquids (clear liquids) Temperature Spikes Noted: No Respiratory Status: Nasal cannula History of Recent Intubation: No Behavior/Cognition: Alert;Cooperative;Pleasant mood Oral Cavity - Dentition: Adequate natural dentition Self-Feeding Abilities: Needs assist;Needs set up Patient Positioning: Upright in bed Baseline Vocal Quality: Clear Volitional Cough: Cognitively unable to elicit Volitional Swallow: Unable to elicit    Oral/Motor/Sensory Function Overall Oral Motor/Sensory Function: Appears within functional limits for tasks assessed  Ice Chips Ice chips: Not tested   Thin Liquid Thin Liquid: Within functional limits Presentation: Straw    Nectar Thick Nectar Thick Liquid: Not tested   Honey Thick Honey Thick Liquid: Not tested   Puree Puree: Within  functional limits Presentation: Self Fed;Spoon Other Comments: JELLO   Solid   GO    Solid: Not tested Other Comments: pt on clear vs full liquids, recommend advance as tolerated as GI deems appropriate as pt with intact speech ability- no dysarthria noted       Kaitlyn Burnetamara Khi Mcmillen, MS [facility] SLP [phone]

## 2014-08-30 NOTE — Progress Notes (Signed)
CRITICAL VALUE ALERT  Critical value received:  Potassium 2.7  Date of notification:  08/30/2014  Time of notification:  0517  Critical value read back:Yes.    Nurse who received alert:  Ophelia CharterLira Vergel de Dios RN  MD notified (1st page):  Merdis DelayK. Schorr NP  Time of first page:  [phone]  MD notified (2nd page):  Time of second page:  Responding MD:  Merdis DelayK. Schorr NP  Time MD responded:  [phone]

## 2014-08-30 NOTE — Progress Notes (Signed)
CARE MANAGEMENT NOTE 08/30/2014  Patient:  Kaitlyn Burns,Kaitlyn Burns   Account Number:  [id number]  Date Initiated:  08/28/2014  Documentation initiated by:  Lanier ClamMAHABIR,KATHY  Subjective/Objective Assessment:   80 y/o f admitted w/rectal bleed.     Action/Plan:   From alf-[facility].   Anticipated DC Date:  08/31/2014   Anticipated DC Plan:  ASSISTED LIVING / REST HOME      DC Planning Services  CM consult      Choice offered to / List presented to:             Status of service:  In process, will continue to follow Medicare Important Message given?   (If response is "NO", the following Medicare IM given date fields will be blank) Date Medicare IM given:   Medicare IM given by:   Date Additional Medicare IM given:   Additional Medicare IM given by:    Discharge Disposition:    Per UR Regulation:  Reviewed for med. necessity/level of care/duration of stay  If discussed at Long Length of Stay Meetings, dates discussed:    Comments:  08/30/14 MMcGibboney, RN, BSN PT Recommendations, SNF;Supervision/Assistance - 24 hour (unless ALF facility feels they can manage pt at current level).    08/28/14 Lanier ClamKathy Mahabir RN BSN NCM [phone] Would recommend PT/OT cons.

## 2014-08-31 ENCOUNTER — Inpatient Hospital Stay (HOSPITAL_COMMUNITY): Payer: Medicare Other

## 2014-08-31 LAB — COMPREHENSIVE METABOLIC PANEL
ALT: 46 U/L — ABNORMAL HIGH (ref 0–35)
ANION GAP: 7 (ref 5–15)
AST: 27 U/L (ref 0–37)
Albumin: 2.5 g/dL — ABNORMAL LOW (ref 3.5–5.2)
Alkaline Phosphatase: 103 U/L (ref 39–117)
BUN: 8 mg/dL (ref 6–23)
CO2: 23 mmol/L (ref 19–32)
CREATININE: 0.75 mg/dL (ref 0.50–1.10)
Calcium: 8 mg/dL — ABNORMAL LOW (ref 8.4–10.5)
Chloride: 107 mEq/L (ref 96–112)
GFR, EST AFRICAN AMERICAN: 86 mL/min — AB (ref >90)
GFR, EST NON AFRICAN AMERICAN: 74 mL/min — AB (ref >90)
GLUCOSE: 161 mg/dL — AB (ref 70–99)
Potassium: 3.9 mmol/L (ref 3.5–5.1)
SODIUM: 137 mmol/L (ref 135–145)
TOTAL PROTEIN: 5.1 g/dL — AB (ref 6.0–8.3)
Total Bilirubin: 0.5 mg/dL (ref 0.3–1.2)

## 2014-08-31 LAB — CBC
HCT: 30.6 % — ABNORMAL LOW (ref 36.0–46.0)
HEMOGLOBIN: 10 g/dL — AB (ref 12.0–15.0)
MCH: 32.8 pg (ref 26.0–34.0)
MCHC: 32.7 g/dL (ref 30.0–36.0)
MCV: 100.3 fL — ABNORMAL HIGH (ref 78.0–100.0)
PLATELETS: 194 10*3/uL (ref 150–400)
RBC: 3.05 MIL/uL — AB (ref 3.87–5.11)
RDW: 14.9 % (ref 11.5–15.5)
WBC: 13.1 10*3/uL — ABNORMAL HIGH (ref 4.0–10.5)

## 2014-08-31 LAB — GLUCOSE, CAPILLARY
GLUCOSE-CAPILLARY: 92 mg/dL (ref 70–99)
Glucose-Capillary: 149 mg/dL — ABNORMAL HIGH (ref 70–99)
Glucose-Capillary: 86 mg/dL (ref 70–99)
Glucose-Capillary: 88 mg/dL (ref 70–99)
Glucose-Capillary: 107 mg/dL — ABNORMAL HIGH (ref 70–99)
Glucose-Capillary: 98 mg/dL (ref 70–99)

## 2014-08-31 LAB — BLOOD GAS, ARTERIAL
Acid-Base Excess: 1.8 mmol/L (ref 0.0–2.0)
Bicarbonate: 26.2 mEq/L — ABNORMAL HIGH (ref 20.0–24.0)
DRAWN BY: 295031
O2 CONTENT: 2 L/min
O2 SAT: 91.4 %
PH ART: 7.403 (ref 7.350–7.450)
Patient temperature: 98.6
TCO2: 24.6 mmol/L (ref 0–100)
pCO2 arterial: 42.9 mmHg (ref 35.0–45.0)
pO2, Arterial: 57.1 mmHg — ABNORMAL LOW (ref 80.0–100.0)

## 2014-08-31 LAB — MAGNESIUM: MAGNESIUM: 1.6 mg/dL (ref 1.5–2.5)

## 2014-08-31 MED ORDER — DIAZEPAM 5 MG/ML IJ SOLN
2.0000 mg | Freq: Once | INTRAMUSCULAR | Status: AC
  Administered 2014-08-31: 2 mg via INTRAVENOUS

## 2014-08-31 MED ORDER — LORAZEPAM 2 MG/ML IJ SOLN
1.0000 mg | Freq: Four times a day (QID) | INTRAMUSCULAR | Status: DC | PRN
  Administered 2014-09-01: 1 mg via INTRAVENOUS
  Filled 2014-08-31 (×2): qty 1

## 2014-08-31 MED ORDER — ONDANSETRON 4 MG PO TBDP
4.0000 mg | ORAL_TABLET | Freq: Four times a day (QID) | ORAL | Status: DC | PRN
  Filled 2014-08-31: qty 1

## 2014-08-31 MED ORDER — KETOROLAC TROMETHAMINE 30 MG/ML IJ SOLN
INTRAMUSCULAR | Status: AC
  Administered 2014-08-31: 30 mg
  Filled 2014-08-31: qty 1

## 2014-08-31 MED ORDER — CLONIDINE HCL 0.1 MG PO TABS
0.1000 mg | ORAL_TABLET | Freq: Every day | ORAL | Status: DC
  Administered 2014-09-04: 0.1 mg via ORAL
  Filled 2014-08-31: qty 1

## 2014-08-31 MED ORDER — MAGNESIUM OXIDE 400 (241.3 MG) MG PO TABS
400.0000 mg | ORAL_TABLET | Freq: Two times a day (BID) | ORAL | Status: DC
  Administered 2014-09-01 – 2014-09-04 (×7): 400 mg via ORAL
  Filled 2014-08-31 (×9): qty 1

## 2014-08-31 MED ORDER — KETOROLAC TROMETHAMINE 30 MG/ML IJ SOLN
30.0000 mg | Freq: Three times a day (TID) | INTRAMUSCULAR | Status: AC | PRN
  Administered 2014-08-31 – 2014-09-01 (×3): 30 mg via INTRAVENOUS
  Filled 2014-08-31 (×4): qty 1

## 2014-08-31 MED ORDER — LORAZEPAM 1 MG PO TABS: 1.0000 mg | ORAL_TABLET | Freq: Four times a day (QID) | ORAL | Status: DC | PRN

## 2014-08-31 MED ORDER — CLONIDINE HCL 0.1 MG PO TABS
0.1000 mg | ORAL_TABLET | ORAL | Status: AC
  Administered 2014-09-02 – 2014-09-03 (×2): 0.1 mg via ORAL
  Filled 2014-08-31 (×4): qty 1

## 2014-08-31 MED ORDER — KETOROLAC TROMETHAMINE 30 MG/ML IJ SOLN: 30.0000 mg | Freq: Once | INTRAMUSCULAR | Status: DC

## 2014-08-31 MED ORDER — DIAZEPAM 5 MG/ML IJ SOLN
INTRAMUSCULAR | Status: AC
  Filled 2014-08-31: qty 2

## 2014-08-31 MED ORDER — HYDROXYZINE HCL 25 MG PO TABS
25.0000 mg | ORAL_TABLET | Freq: Four times a day (QID) | ORAL | Status: DC | PRN
  Administered 2014-09-01 – 2014-09-04 (×4): 25 mg via ORAL
  Filled 2014-08-31 (×6): qty 1

## 2014-08-31 MED ORDER — CLONIDINE HCL 0.1 MG PO TABS
0.1000 mg | ORAL_TABLET | Freq: Four times a day (QID) | ORAL | Status: AC
  Administered 2014-08-31 – 2014-09-01 (×7): 0.1 mg via ORAL
  Filled 2014-08-31 (×8): qty 1

## 2014-08-31 NOTE — Progress Notes (Addendum)
PROGRESS NOTE  Kaitlyn Burns WUJ:[id number] DOB: October 31, 1924 DOA: 08/26/2014 PCP: Kaitlyn Bonito, MD      Brief history  80 year old female with a history of diabetes mellitus, CKD stage II-III, hypertension, hypothyroidism presented from [facility] with altered mental status. Unfortunately, the patient is unable to provide any significant history. I spoke with the patient's son, Kaitlyn Burns.  He revealed to me that the patient has a long history of alcohol dependence. More recently, the patient has been drinking one to 2 glasses of wine daily. At her assisted living facility, they are allowed to keep her own alcohol in the room.  At baseline, the patient is able to make transfers from the bed to her wheelchair with which she gets around. Unfortunately, the patient has much difficulty ambulating at baseline. The patient was noted to have acute kidney injury with elevated serum creatinine and elevated point-of-care troponin in the emergency department. In addition, fecal occult blood test was also positive. The patient was also noted to have relative hypotension with systolic blood pressures in the mid 90s. Notably, the patient was admitted in January 2015 with significant anemia with hemoglobin of 4.8. She has not had any endoscopy or GI follow-up. During her January admission, the patient was given 3 units PRBC.    Assessment/Plan: Acute encephalopathy, most likely secondary to alcohol withdrawal, narcotic withdrawal, patient tachycardic, hypertensive-continues to be very confused -Multifactorial including anemia, acute kidney injury, dehydration and possibly alcohol withdrawal and Wernicke's encephalopathy -Check for reversible causes of encephalopathy including ammonia level which was 50, vitamin B-12 1213 -TSH--0.123 , will recheck ammonia level again tomorrow Continue thiamine supplementation -CIWA scale CT head negative Speech therapy recommends regular diet with thin liquids,  PT/OT eval-recommended SNF Repeat ABG shows metabolic acidosis has resolved We'll start the patient on clonidine for suspected narcotic withdrawal   Anemia with positive FOBT/melena -FOBT was positive in the emergency department -I have performed rectal exam on 12/21-->melena and positive FOBT again Hemoglobin stable -08/27/2014 EGD--negative Colonoscopy shows possible ischemic colitis -hold ASA for now, avoid NSAIDs -continue protonix , GI has signed off  Leukocytosis In the setting of agitation, concerned about aspiration pneumonia We'll repeat a chest x-ray Blood culture no growth so far  Alcohol dependence /narcotic dependence Extensive discussion with the son, concern about overuse of narcotics along with alcohol Patient may be in mild withdrawal from alcohol as well as narcotics, We will minimize narcotics in the hospital, start opioid withdrawal protocol Discontinue Dilaudid  Sinus tachycardia -may be rebound from being off diltiazem vs Etoh/opioid withdraw -TSH 0.123--although a little suppressed, suspect sick euthyroid syndrome -continue IVF Restart diltiazem  Acute on chronic renal failure (CKD2-3) -baseline creatinine 0.9-1.2 -renal us--neg for hydronephrosis -continue IVF Renal function improving, restart lisinopril  Elevated point-of-care troponin/abnormal EKG -Cycle troponins--neg x 3 -no chest pain presently  Diabetes mellitus type 2 -Hemoglobin A1c--5.8 -NovoLog sliding scale  Hypothyroidism  -TSH is 0.123 , free T4 is normal -Continue Synthroid   Relative hypotension  -improving with IVF Restart antihypertensive medications   Hypokalemia Replete potassium Magnesium 1.5, repleted  Family Communication:discussed with son Kaitlyn Burns on phone Disposition Plan: PT/OT/speech     Procedures/Studies: Ct Abdomen Pelvis Wo Contrast  08/26/2014   CLINICAL DATA:  Altered mental status, unable to void  EXAM: CT ABDOMEN AND PELVIS WITHOUT  CONTRAST  TECHNIQUE: Multidetector CT imaging of the abdomen and pelvis was performed following the standard protocol without IV contrast.  COMPARISON:  01/28/2010  FINDINGS: Dependent bibasilar atelectasis noted.  Unenhanced liver, adrenal glands, spleen, and pancreas are unremarkable. Motion artifact degrades imaging. Exophytic right lower renal pole 0.8 cm probable hyperdense cyst is identified, unchanged from the prior exam. Sub cm low-density bilateral renal cortical lesions elsewhere are reidentified but not further characterized on the current noncontrast exam. No free fluid or air. No lymphadenopathy.  Normal appendix, image 59. No bowel wall thickening or focal segmental dilatation is identified. Bladder is decompressed but unremarkable. Uterus not visualized. Ovaries are normal. Moderate atheromatous aortic calcification noted without aneurysm. Multilevel disc degenerative change noted in the spine.  IMPRESSION: No acute intra-abdominal or pelvic pathology.   Electronically Signed   By: Christiana Pellant M.D.   On: 08/26/2014 23:47   Ct Head Wo Contrast  08/26/2014   CLINICAL DATA:  Acute onset of confusion. Patient has not voided today. Initial encounter.  EXAM: CT HEAD WITHOUT CONTRAST  TECHNIQUE: Contiguous axial images were obtained from the base of the skull through the vertex without intravenous contrast.  COMPARISON:  CT of the head performed 01/17/2010  FINDINGS: There is no evidence of acute infarction, mass lesion, or intra- or extra-axial hemorrhage on CT.  Prominence of the ventricles and sulci reflects mild to moderate cortical volume loss. Cerebellar atrophy is noted. Mild periventricular and subcortical white matter change likely reflects small vessel ischemic microangiopathy.  The brainstem and fourth ventricle are within normal limits. The basal ganglia are unremarkable in appearance. The cerebral hemispheres demonstrate grossly normal gray-white differentiation. No mass effect or  midline shift is seen.  There is no evidence of fracture; visualized osseous structures are unremarkable in appearance. The orbits are within normal limits. Dense inspissated mucus is noted within the right maxillary sinus. The remaining paranasal sinuses and mastoid air cells are well-aerated. No significant soft tissue abnormalities are seen.  IMPRESSION: 1. No acute intracranial pathology seen on CT. 2. Mild to moderate cortical volume loss and scattered small vessel ischemic microangiopathy. 3. Dense inspissated mucus noted within the right maxillary sinus.   Electronically Signed   By: Roanna Raider M.D.   On: 08/26/2014 23:46   US Renal  08/27/2014   CLINICAL DATA:  Subsequent encounter for acute on chronic renal failure  EXAM: RENAL/URINARY TRACT ULTRASOUND COMPLETE  COMPARISON:  CT scan from 08/26/2014.  FINDINGS: Right Kidney:  Length: 10.6 cm. Poor acoustic window limited assessment. The low-density lesion seen on the CT scan yesterday could not be visualized by ultrasound today.  Left Kidney:  Length: 9.8 cm. Echogenicity within normal limits. No mass or hydronephrosis visualized.  Bladder:  Appears normal for degree of bladder distention.  IMPRESSION: Limited study secondary to poor acoustic window bilaterally. No hydronephrosis. Right renal cyst seen on CT scan yesterday not evident on ultrasound today.   Electronically Signed   By: Kennith Center M.D.   On: 08/27/2014 10:15   Dg Chest Portable 1 View  08/26/2014   CLINICAL DATA:  Altered mental status, diabetes  EXAM: PORTABLE CHEST - 1 VIEW  COMPARISON:  None  FINDINGS: Heart size is within normal limits. The aorta is unfolded and ectatic. No focal pulmonary opacity. Evidence of arose origin or remote resection of the distal right clavicle. No acute osseous abnormality. No pleural effusion.  IMPRESSION: No acute cardiopulmonary process.   Electronically Signed   By: Christiana Pellant M.D.   On: 08/26/2014 22:37        Subjective: Patient  is very agitated and confused and combative today  Objective:  Filed Vitals:   08/30/14 0010 08/30/14 0649 08/30/14 2348 08/31/14 0619  BP: 149/65 157/91 163/63 146/70  Pulse: 104 101 115 103  Temp: 97.5 F (36.4 C) 97.8 F (36.6 C) 98.3 F (36.8 C) 97.5 F (36.4 C)  TempSrc: Oral Oral Oral Axillary  Resp: 20 21 21 21   Height:      Weight:      SpO2: 100% 100% 100% 97%    Intake/Output Summary (Last 24 hours) at 08/31/14 1118 Last data filed at 08/31/14 0700  Gross per 24 hour  Intake   1800 ml  Output    925 ml  Net    875 ml   Weight change:  Exam:   General:  Agitated confused and combative  HEENT: No icterus, No thrush, No meningismus, [place]/AT  Cardiovascular: RRR, S1/S2, no rubs, no gallops  Respiratory: poor inspiratory effort but clear to auscultation   Abdomen: Soft/+BS, non tender, non distended, no guarding  Extremities: No edema, No lymphangitis, No petechiae, No rashes, no synovitis  Data Reviewed: Basic Metabolic Panel:  Recent Labs Lab 08/27/14 0223 08/28/14 0510 08/29/14 0540 08/30/14 0415 08/31/14 0445  NA 136* 139 147* 146* 137  K 3.9 3.2* 2.8* 2.7* 3.9  CL 103 115* 123* 120* 107  CO2 16* 15* 16* 21 23  GLUCOSE 95 70 90 140* 161*  BUN 31* 30* 20 14 8   CREATININE 2.56* 1.65* 0.91 0.79 0.75  CALCIUM 8.3* 7.8* 8.1* 8.2* 8.0*  MG  --   --  1.5  --  1.6   Liver Function Tests:  Recent Labs Lab 08/26/14 2134 08/30/14 0415 08/31/14 0445  AST 28 29 27   ALT 26 33 46*  ALKPHOS 152* 106 103  BILITOT 0.2* 0.4 0.5  PROT 6.3 5.0* 5.1*  ALBUMIN 2.9* 2.4* 2.5*   No results for input(s): LIPASE, AMYLASE in the last 168 hours.  Recent Labs Lab 08/28/14 1833  AMMONIA 50*   CBC:  Recent Labs Lab 08/26/14 2134 08/27/14 0223  08/28/14 0510 08/28/14 1106 08/29/14 0540 08/30/14 0415 08/31/14 0445  WBC 18.0* 13.5*  --  9.9  --  11.4* 12.3* 13.1*  NEUTROABS 13.9*  --   --   --   --   --   --   --   HGB 11.1* 11.3*  < > 10.1* 9.7*  10.0* 10.1* 10.0*  HCT 33.9* 35.5*  < > 31.2* 29.6* 30.6* 31.2* 30.6*  MCV 101.8* 102.9*  --  102.0*  --  100.0 100.3* 100.3*  PLT 295 232  --  184  --  179 215 194  < > = values in this interval not displayed. Cardiac Enzymes:  Recent Labs Lab 08/27/14 0223 08/27/14 0755 08/27/14 1050 08/27/14 1313 08/27/14 1715 08/27/14 2255 08/28/14 0510 08/28/14 1106  CKTOTAL 61  --  54  --  54 49 35 44  CKMB 12.2*  --  9.6*  --  9.2* 12.2* 12.6* 10.8*  TROPONINI <0.30 <0.30  --  <0.30  --   --   --   --    BNP: Invalid input(s): POCBNP CBG:  Recent Labs Lab 08/30/14 2004 08/30/14 2342 08/31/14 0403 08/31/14 0729 08/31/14 0754  GLUCAP 108* 141* 149* 86 92    Recent Results (from the past 240 hour(s))  MRSA PCR Screening     Status: None   Collection Time: 08/27/14  1:56 AM  Result Value Ref Range Status   MRSA by PCR NEGATIVE NEGATIVE Final    Comment:  The GeneXpert MRSA Assay (FDA approved for NASAL specimens only), is one component of a comprehensive MRSA colonization surveillance program. It is not intended to diagnose MRSA infection nor to guide or monitor treatment for MRSA infections.   Culture, blood (routine x 2)     Status: None (Preliminary result)   Collection Time: 08/27/14 10:50 AM  Result Value Ref Range Status   Specimen Description BLOOD LEFT ARM  Final   Special Requests BOTTLES DRAWN AEROBIC AND ANAEROBIC [facility]CC  Final   Culture  Setup Time   Final    08/27/2014 13:22 Performed at [facility]    Culture   Final           BLOOD CULTURE RECEIVED NO GROWTH TO DATE CULTURE WILL BE HELD FOR 5 DAYS BEFORE ISSUING A FINAL NEGATIVE REPORT Performed at [facility]    Report Status PENDING  Incomplete  Culture, blood (routine x 2)     Status: None (Preliminary result)   Collection Time: 08/27/14 10:55 AM  Result Value Ref Range Status   Specimen Description BLOOD RIGHT ARM  Final   Special Requests BOTTLES DRAWN AEROBIC ONLY 5CC   Final   Culture  Setup Time   Final    08/27/2014 13:22 Performed at [facility]    Culture   Final           BLOOD CULTURE RECEIVED NO GROWTH TO DATE CULTURE WILL BE HELD FOR 5 DAYS BEFORE ISSUING A FINAL NEGATIVE REPORT Performed at [facility]    Report Status PENDING  Incomplete  Clostridium Difficile by PCR     Status: None   Collection Time: 08/30/14 10:17 AM  Result Value Ref Range Status   C difficile by pcr NEGATIVE NEGATIVE Final    Comment: [facility]     Scheduled Meds: . allopurinol  100 mg Oral BID  . diltiazem  240 mg Oral Daily  . feeding supplement (RESOURCE BREEZE)  1 Container Oral TID BM  . folic acid  1 mg Oral Daily  . insulin aspart  0-9 Units Subcutaneous 6 times per day  . latanoprost  1 drop Both Eyes QHS  . levothyroxine  75 mcg Oral QAC breakfast  . lisinopril  2.5 mg Oral Daily  . magnesium oxide  400 mg Oral BID  . multivitamin with minerals  1 tablet Oral Daily  . pantoprazole (PROTONIX) IV  40 mg Intravenous Q24H  . potassium chloride  40 mEq Oral TID   Continuous Infusions: .  sodium bicarbonate infusion 1/4 NS 1000 mL 75 mL/hr at 08/31/14 [id number]     [facility] Center[facility]OL,Shaune Malacara, MD [facility] Pager [phone]  If 7PM-7AM, please contact night-coverage [email or web address] Password [email or web address] 08/31/2014, 11:18 AM   LOS: 5 days

## 2014-08-31 NOTE — Progress Notes (Signed)
Per report from prior RN, indwelling catheter removed before noon today. Patient has not voided since catheter removed. Bladder scan at 1930 revealed 120 mL of urine. Patient restless complaining of "I need to go to the bathroom" repeat bladder scan revealed 490 mL of urine. Lenny Pastelom Callahan NP paged regarding this. He will place order to insert indwelling catheter.

## 2014-09-01 LAB — BASIC METABOLIC PANEL
Anion gap: 7 (ref 5–15)
BUN: 5 mg/dL — AB (ref 6–23)
CO2: 26 mmol/L (ref 19–32)
CREATININE: 0.61 mg/dL (ref 0.50–1.10)
Calcium: 7.9 mg/dL — ABNORMAL LOW (ref 8.4–10.5)
Chloride: 108 mEq/L (ref 96–112)
GFR calc Af Amer: >90 mL/min (ref >90)
GFR calc non Af Amer: 79 mL/min — ABNORMAL LOW (ref >90)
Glucose, Bld: 91 mg/dL (ref 70–99)
Potassium: 4.6 mmol/L (ref 3.5–5.1)
Sodium: 141 mmol/L (ref 135–145)

## 2014-09-01 LAB — GLUCOSE, CAPILLARY
GLUCOSE-CAPILLARY: 77 mg/dL (ref 70–99)
GLUCOSE-CAPILLARY: 107 mg/dL — AB (ref 70–99)
Glucose-Capillary: 109 mg/dL — ABNORMAL HIGH (ref 70–99)
Glucose-Capillary: 110 mg/dL — ABNORMAL HIGH (ref 70–99)
Glucose-Capillary: 157 mg/dL — ABNORMAL HIGH (ref 70–99)
Glucose-Capillary: 92 mg/dL (ref 70–99)
Glucose-Capillary: 94 mg/dL (ref 70–99)

## 2014-09-01 MED ORDER — FUROSEMIDE 10 MG/ML IJ SOLN
20.0000 mg | Freq: Once | INTRAMUSCULAR | Status: AC
  Administered 2014-09-01: 20 mg via INTRAVENOUS
  Filled 2014-09-01: qty 2

## 2014-09-01 MED ORDER — DIAZEPAM 5 MG/ML IJ SOLN
2.0000 mg | INTRAMUSCULAR | Status: DC | PRN
  Administered 2014-09-01 – 2014-09-02 (×3): 2 mg via INTRAVENOUS
  Filled 2014-09-01 (×4): qty 2

## 2014-09-01 MED ORDER — FUROSEMIDE 10 MG/ML IJ SOLN: 40.0000 mg | Freq: Once | INTRAMUSCULAR | Status: DC

## 2014-09-01 NOTE — Progress Notes (Addendum)
PROGRESS NOTE  Kaitlyn Burns ZOX:[id number] DOB: 03/08/1925 DOA: 08/26/2014 PCP: Brooke BonitoGALLEMORE,WARREN, MD      Brief history  80 year old female with a history of diabetes mellitus, CKD stage II-III, hypertension, hypothyroidism [facility] with altered mental status. Unfortunately, the patient is unable to provide any significant history. I spoke with the patient's son, Nadine CountsBob.  He revealed to me that the patient has a long history of alcohol dependence. More recently, the patient has been drinking one to 2 glasses of wine daily. At her assisted living facility, they are allowed to keep her own alcohol in the room.  At baseline, the patient is able to make transfers from the bed to her wheelchair with which she gets around. Unfortunately, the patient has much difficulty ambulating at baseline. The patient was noted to have acute kidney injury with elevated serum creatinine and elevated point-of-care troponin in the emergency department. In addition, fecal occult blood test was also positive. The patient was also noted to have relative hypotension with systolic blood pressures in the mid 90s. Notably, the patient was admitted in January 2015 with significant anemia with hemoglobin of 4.8. She has not had any endoscopy or GI follow-up. During her January admission, the patient was given 3 units PRBC.    Assessment/Plan: Acute encephalopathy, most likely secondary to alcohol withdrawal, narcotic withdrawal, patient tachycardic, hypertensive-continues to be very confused -Multifactorial including anemia, acute kidney injury, dehydration and possibly alcohol withdrawal and Wernicke's encephalopathy ammonia level   was 50, vitamin B-12 1213 -TSH--0.123 , will recheck ammonia level again tomorrow Continue thiamine supplementation -CIWA scale -use diazepam instead, lorazepam discontinued CT head negative Speech therapy recommends regular diet with thin liquids, PT/OT  eval-recommended SNF Repeat ABG shows metabolic acidosis has resolved Continue clonidine for suspected narcotic withdrawal Place a safety sitter given high fall risk Make the patient nothing by mouth , given severe agitation, high aspiration risk   Anemia with positive FOBT/melena -FOBT was positive in the emergency department -I have performed rectal exam on 12/21-->melena and positive FOBT again Hemoglobin stable -08/27/2014 EGD--negative Colonoscopy shows possible ischemic colitis -hold ASA for now, avoid NSAIDs -continue protonix , GI has signed off  Leukocytosis In the setting of agitation, concerned about aspiration pneumonia Chest x-ray shows diffuse interstitial infiltrate, unlikely pneumonia Blood culture no growth so far   Alcohol dependence /narcotic dependence Extensive discussion with the son, concern about overuse of narcotics along with alcohol Patient may be in mild withdrawal from alcohol as well as narcotics, We will minimize narcotics in the hospital, start opioid withdrawal protocol Discontinue Dilaudid Continue clonidine protocol   Sinus tachycardia-resolved -may be rebound from being off diltiazem vs Etoh/opioid withdraw -TSH 0.123--although a little suppressed, suspect sick euthyroid syndrome -continue IVF Continue diltiazem  Acute on chronic renal failure (CKD2-3) -baseline creatinine 0.9-1.2 -renal us--neg for hydronephrosis Discontinue IV fluids because of pulmonary congestion, will administer one dose of IV Lasix Renal function improving, restart lisinopril  Elevated point-of-care troponin/abnormal EKG -Cycle troponins--neg x 3 -no chest pain presently  Diabetes mellitus type 2 -Hemoglobin A1c--5.8 -NovoLog sliding scale  Hypothyroidism  -TSH is 0.123 , free T4 is normal -Continue Synthroid   Relative hypotension  -improving with IVF Restart antihypertensive medications   Hypokalemia Replete potassium Magnesium 1.5,  repleted   Family Communication:discussed with son Nadine CountsBob on phone Disposition Plan: PT/OT/speech     Procedures/Studies: Ct Abdomen Pelvis Wo Contrast  08/26/2014   CLINICAL DATA:  Altered  mental status, unable to void  EXAM: CT ABDOMEN AND PELVIS WITHOUT CONTRAST  TECHNIQUE: Multidetector CT imaging of the abdomen and pelvis was performed following the standard protocol without IV contrast.  COMPARISON:  01/28/2010  FINDINGS: Dependent bibasilar atelectasis noted.  Unenhanced liver, adrenal glands, spleen, and pancreas are unremarkable. Motion artifact degrades imaging. Exophytic right lower renal pole 0.8 cm probable hyperdense cyst is identified, unchanged from the prior exam. Sub cm low-density bilateral renal cortical lesions elsewhere are reidentified but not further characterized on the current noncontrast exam. No free fluid or air. No lymphadenopathy.  Normal appendix, image 59. No bowel wall thickening or focal segmental dilatation is identified. Bladder is decompressed but unremarkable. Uterus not visualized. Ovaries are normal. Moderate atheromatous aortic calcification noted without aneurysm. Multilevel disc degenerative change noted in the spine.  IMPRESSION: No acute intra-abdominal or pelvic pathology.   Electronically Signed   By: Christiana Pellant M.D.   On: 08/26/2014 23:47   Ct Head Wo Contrast  08/26/2014   CLINICAL DATA:  Acute onset of confusion. Patient has not voided today. Initial encounter.  EXAM: CT HEAD WITHOUT CONTRAST  TECHNIQUE: Contiguous axial images were obtained from the base of the skull through the vertex without intravenous contrast.  COMPARISON:  CT of the head performed 01/17/2010  FINDINGS: There is no evidence of acute infarction, mass lesion, or intra- or extra-axial hemorrhage on CT.  Prominence of the ventricles and sulci reflects mild to moderate cortical volume loss. Cerebellar atrophy is noted. Mild periventricular and subcortical white matter change  likely reflects small vessel ischemic microangiopathy.  The brainstem and fourth ventricle are within normal limits. The basal ganglia are unremarkable in appearance. The cerebral hemispheres demonstrate grossly normal gray-white differentiation. No mass effect or midline shift is seen.  There is no evidence of fracture; visualized osseous structures are unremarkable in appearance. The orbits are within normal limits. Dense inspissated mucus is noted within the right maxillary sinus. The remaining paranasal sinuses and mastoid air cells are well-aerated. No significant soft tissue abnormalities are seen.  IMPRESSION: 1. No acute intracranial pathology seen on CT. 2. Mild to moderate cortical volume loss and scattered small vessel ischemic microangiopathy. 3. Dense inspissated mucus noted within the right maxillary sinus.   Electronically Signed   By: Roanna Raider M.D.   On: 08/26/2014 23:46   US Renal  08/27/2014   CLINICAL DATA:  Subsequent encounter for acute on chronic renal failure  EXAM: RENAL/URINARY TRACT ULTRASOUND COMPLETE  COMPARISON:  CT scan from 08/26/2014.  FINDINGS: Right Kidney:  Length: 10.6 cm. Poor acoustic window limited assessment. The low-density lesion seen on the CT scan yesterday could not be visualized by ultrasound today.  Left Kidney:  Length: 9.8 cm. Echogenicity within normal limits. No mass or hydronephrosis visualized.  Bladder:  Appears normal for degree of bladder distention.  IMPRESSION: Limited study secondary to poor acoustic window bilaterally. No hydronephrosis. Right renal cyst seen on CT scan yesterday not evident on ultrasound today.   Electronically Signed   By: Kennith Center M.D.   On: 08/27/2014 10:15   Dg Chest Port 1 View  08/31/2014   CLINICAL DATA:  Leukocytosis, diabetes, hypertension, kidney disease, altered mental status  EXAM: PORTABLE CHEST - 1 VIEW  COMPARISON:  Portable exam 125 hr compared to 08/26/2014  FINDINGS: Rotated to the LEFT.  Normal heart  size and mediastinal contours.  Pulmonary vascular congestion.  Atherosclerotic calcification aorta.  Scattered interstitial infiltrates diffusely new since previous exam favor  pulmonary edema though infection not completely excluded.  No gross pleural effusion or pneumothorax.  Bones demineralized.  Underlying COPD.  IMPRESSION: Diffuse interstitial infiltrates new since previous exam favor pulmonary edema over infection.   Electronically Signed   By: Ulyses Southward M.D.   On: 08/31/2014 11:52   Dg Chest Portable 1 View  08/26/2014   CLINICAL DATA:  Altered mental status, diabetes  EXAM: PORTABLE CHEST - 1 VIEW  COMPARISON:  None  FINDINGS: Heart size is within normal limits. The aorta is unfolded and ectatic. No focal pulmonary opacity. Evidence of arose origin or remote resection of the distal right clavicle. No acute osseous abnormality. No pleural effusion.  IMPRESSION: No acute cardiopulmonary process.   Electronically Signed   By: Christiana Pellant M.D.   On: 08/26/2014 22:37        Subjective: Patient is very agitated and confused and combative today  Objective: Filed Vitals:   08/31/14 1519 08/31/14 2057 09/01/14 0208 09/01/14 0712  BP: 146/68 159/97 135/75 138/57  Pulse: 112 102 90 82  Temp: 97.6 F (36.4 C) 97.5 F (36.4 C) 97.6 F (36.4 C) 97.4 F (36.3 C)  TempSrc: Axillary Axillary Axillary Axillary  Resp: 20 20 20 18   Height:      Weight:      SpO2: 98% 92% 96% 100%    Intake/Output Summary (Last 24 hours) at 09/01/14 1045 Last data filed at 09/01/14 0932  Gross per 24 hour  Intake 971.25 ml  Output    800 ml  Net 171.25 ml   Weight change:  Exam:   General:  Agitated confused and combative  HEENT: No icterus, No thrush, No meningismus, [place]/AT  Cardiovascular: RRR, S1/S2, no rubs, no gallops  Respiratory: poor inspiratory effort but clear to auscultation   Abdomen: Soft/+BS, non tender, non distended, no guarding  Extremities: No edema, No lymphangitis, No  petechiae, No rashes, no synovitis  Data Reviewed: Basic Metabolic Panel:  Recent Labs Lab 08/28/14 0510 08/29/14 0540 08/30/14 0415 08/31/14 0445 09/01/14 0514  NA 139 147* 146* 137 141  K 3.2* 2.8* 2.7* 3.9 4.6  CL 115* 123* 120* 107 108  CO2 15* 16* 21 23 26   GLUCOSE 70 90 140* 161* 91  BUN 30* 20 14 8  5*  CREATININE 1.65* 0.91 0.79 0.75 0.61  CALCIUM 7.8* 8.1* 8.2* 8.0* 7.9*  MG  --  1.5  --  1.6  --    Liver Function Tests:  Recent Labs Lab 08/26/14 2134 08/30/14 0415 08/31/14 0445  AST 28 29 27   ALT 26 33 46*  ALKPHOS 152* 106 103  BILITOT 0.2* 0.4 0.5  PROT 6.3 5.0* 5.1*  ALBUMIN 2.9* 2.4* 2.5*   No results for input(s): LIPASE, AMYLASE in the last 168 hours.  Recent Labs Lab 08/28/14 1833  AMMONIA 50*   CBC:  Recent Labs Lab 08/26/14 2134 08/27/14 0223  08/28/14 0510 08/28/14 1106 08/29/14 0540 08/30/14 0415 08/31/14 0445  WBC 18.0* 13.5*  --  9.9  --  11.4* 12.3* 13.1*  NEUTROABS 13.9*  --   --   --   --   --   --   --   HGB 11.1* 11.3*  < > 10.1* 9.7* 10.0* 10.1* 10.0*  HCT 33.9* 35.5*  < > 31.2* 29.6* 30.6* 31.2* 30.6*  MCV 101.8* 102.9*  --  102.0*  --  100.0 100.3* 100.3*  PLT 295 232  --  184  --  179 215 194  < > =  values in this interval not displayed. Cardiac Enzymes:  Recent Labs Lab 08/27/14 0223 08/27/14 0755 08/27/14 1050 08/27/14 1313 08/27/14 1715 08/27/14 2255 08/28/14 0510 08/28/14 1106  CKTOTAL 61  --  54  --  54 49 35 44  CKMB 12.2*  --  9.6*  --  9.2* 12.2* 12.6* 10.8*  TROPONINI <0.30 <0.30  --  <0.30  --   --   --   --    BNP: Invalid input(s): POCBNP CBG:  Recent Labs Lab 08/31/14 1547 08/31/14 2024 09/01/14 0006 09/01/14 0421 09/01/14 0754  GLUCAP 107* 98 94 92 77    Recent Results (from the past 240 hour(s))  MRSA PCR Screening     Status: None   Collection Time: 08/27/14  1:56 AM  Result Value Ref Range Status   MRSA by PCR NEGATIVE NEGATIVE Final    Comment:        The GeneXpert MRSA  Assay (FDA approved for NASAL specimens only), is one component of a comprehensive MRSA colonization surveillance program. It is not intended to diagnose MRSA infection nor to guide or monitor treatment for MRSA infections.   Culture, blood (routine x 2)     Status: None (Preliminary result)   Collection Time: 08/27/14 10:50 AM  Result Value Ref Range Status   Specimen Description BLOOD LEFT ARM  Final   Special Requests BOTTLES DRAWN AEROBIC AND ANAEROBIC [facility]  Final   Culture  Setup Time   Final    08/27/2014 13:22 Performed at [facility]    Culture   Final           BLOOD CULTURE RECEIVED NO GROWTH TO DATE CULTURE WILL BE HELD FOR 5 DAYS BEFORE ISSUING A FINAL NEGATIVE REPORT Performed at [facility]    Report Status PENDING  Incomplete  Culture, blood (routine x 2)     Status: None (Preliminary result)   Collection Time: 08/27/14 10:55 AM  Result Value Ref Range Status   Specimen Description BLOOD RIGHT ARM  Final   Special Requests BOTTLES DRAWN AEROBIC ONLY 5CC  Final   Culture  Setup Time   Final    08/27/2014 13:22 Performed at [facility]    Culture   Final           BLOOD CULTURE RECEIVED NO GROWTH TO DATE CULTURE WILL BE HELD FOR 5 DAYS BEFORE ISSUING A FINAL NEGATIVE REPORT Performed at [facility]    Report Status PENDING  Incomplete  Clostridium Difficile by PCR     Status: None   Collection Time: 08/30/14 10:17 AM  Result Value Ref Range Status   C difficile by pcr NEGATIVE NEGATIVE Final    Comment: Performed at [facility], [place]     Scheduled Meds: . allopurinol  100 mg Oral BID  . cloNIDine  0.1 mg Oral QID   Followed by  . [START ON 09/02/2014] cloNIDine  0.1 mg Oral BH-qamhs   Followed by  . [START ON 09/04/2014] cloNIDine  0.1 mg Oral QAC breakfast  . diltiazem  240 mg Oral Daily  . feeding supplement (RESOURCE BREEZE)  1 Container Oral TID BM  . folic acid  1 mg Oral Daily  . insulin aspart  0-9 Units  Subcutaneous 6 times per day  . latanoprost  1 drop Both Eyes QHS  . levothyroxine  75 mcg Oral QAC breakfast  . magnesium oxide  400 mg Oral BID  . multivitamin with minerals  1 tablet  Oral Daily  . pantoprazole (PROTONIX) IV  40 mg Intravenous Q24H   Continuous Infusions:     Richarda OverlieABROL,Leviathan Macera, MD [facility] Pager [phone]  If 7PM-7AM, please contact night-coverage [email or web address] Password [email or web address] 09/01/2014, 10:45 AM   LOS: 6 days

## 2014-09-02 LAB — CULTURE, BLOOD (ROUTINE X 2)
CULTURE: NO GROWTH
Culture: NO GROWTH

## 2014-09-02 LAB — COMPREHENSIVE METABOLIC PANEL
ALT: 45 U/L — AB (ref 0–35)
AST: 34 U/L (ref 0–37)
Albumin: 2.5 g/dL — ABNORMAL LOW (ref 3.5–5.2)
Alkaline Phosphatase: 101 U/L (ref 39–117)
Anion gap: 6 (ref 5–15)
BUN: 5 mg/dL — ABNORMAL LOW (ref 6–23)
CALCIUM: 7.5 mg/dL — AB (ref 8.4–10.5)
CHLORIDE: 104 meq/L (ref 96–112)
CO2: 31 mmol/L (ref 19–32)
Creatinine, Ser: 0.74 mg/dL (ref 0.50–1.10)
GFR calc Af Amer: 86 mL/min — ABNORMAL LOW (ref >90)
GFR calc non Af Amer: 74 mL/min — ABNORMAL LOW (ref >90)
Glucose, Bld: 118 mg/dL — ABNORMAL HIGH (ref 70–99)
Potassium: 3.6 mmol/L (ref 3.5–5.1)
SODIUM: 141 mmol/L (ref 135–145)
Total Bilirubin: 0.4 mg/dL (ref 0.3–1.2)
Total Protein: 4.7 g/dL — ABNORMAL LOW (ref 6.0–8.3)

## 2014-09-02 LAB — CBC
HEMATOCRIT: 26.3 % — AB (ref 36.0–46.0)
Hemoglobin: 8.6 g/dL — ABNORMAL LOW (ref 12.0–15.0)
MCH: 32.7 pg (ref 26.0–34.0)
MCHC: 32.7 g/dL (ref 30.0–36.0)
MCV: 100 fL (ref 78.0–100.0)
Platelets: 180 10*3/uL (ref 150–400)
RBC: 2.63 MIL/uL — ABNORMAL LOW (ref 3.87–5.11)
RDW: 14.7 % (ref 11.5–15.5)
WBC: 10.9 10*3/uL — AB (ref 4.0–10.5)

## 2014-09-02 LAB — GLUCOSE, CAPILLARY
GLUCOSE-CAPILLARY: 123 mg/dL — AB (ref 70–99)
GLUCOSE-CAPILLARY: 102 mg/dL — AB (ref 70–99)
Glucose-Capillary: 64 mg/dL — ABNORMAL LOW (ref 70–99)
Glucose-Capillary: 119 mg/dL — ABNORMAL HIGH (ref 70–99)
Glucose-Capillary: 156 mg/dL — ABNORMAL HIGH (ref 70–99)

## 2014-09-02 LAB — AMMONIA: Ammonia: 26 umol/L (ref 11–32)

## 2014-09-02 MED ORDER — PANTOPRAZOLE SODIUM 40 MG PO TBEC
40.0000 mg | DELAYED_RELEASE_TABLET | Freq: Every day | ORAL | Status: DC
  Administered 2014-09-04 (×2): 40 mg via ORAL
  Filled 2014-09-02 (×2): qty 1

## 2014-09-02 MED ORDER — DIAZEPAM 5 MG/ML IJ SOLN
2.0000 mg | Freq: Four times a day (QID) | INTRAMUSCULAR | Status: DC | PRN
  Administered 2014-09-02 – 2014-09-03 (×3): 2 mg via INTRAMUSCULAR
  Filled 2014-09-02 (×3): qty 2

## 2014-09-02 NOTE — Progress Notes (Signed)
Occupational Therapy Treatment Patient Details Name: Kaitlyn Burns MRN: [id number] DOB: 04/20/1925 Today's Date: 09/02/2014    History of present illness This 80 y.o. female admitted with AMS.  She was found to have AKI, elevated troponin and hypotensive.  Dx: Acute Encephalopathy (mulitfactorial including anemia, AKI, dehydration, and possibly ETOH withdrawal and wernicke's encephalopathy.  She underwent colonoscopy which showed ischemic colitis.  PMH: ETOH abuse; HTN; Hypothyroidism.    OT comments  Pt alert and cooperative this session  Follow Up Recommendations  SNF    Equipment Recommendations  None recommended by OT    Recommendations for Other Services      Precautions / Restrictions Precautions Precautions: Fall       Mobility Bed Mobility                  Transfers                      Balance                                   ADL       Grooming: Wash/dry hands;Wash/dry face;Applying deodorant;Brushing hair;Supervision/safety;Bed level   Upper Body Bathing: Minimal assitance;Bed level                             General ADL Comments: pt sustained attention to task, performing UB adls thoroughly.  Pleasant and cooperative this session      Vision                     Perception     Praxis      Cognition   Behavior During Therapy: [facility] for tasks assessed/performed Overall Cognitive Status: No family/caregiver present to determine baseline cognitive functioning   Orientation Level: Disoriented to;Place;Time;Situation Current Attention Level: Sustained    Following Commands: Follows one step commands consistently            Extremity/Trunk Assessment               Exercises     Shoulder Instructions       General Comments      Pertinent Vitals/ Pain       Pain Assessment: No/denies pain Faces Pain Scale: No hurt  Home Living                                           Prior Functioning/Environment              Frequency Min 2X/week     Progress Toward Goals  OT Goals(current goals can now be found in the care plan section)  Progress towards OT goals: Progressing toward goals     Plan Discharge plan remains appropriate    Co-evaluation                 End of Session     Activity Tolerance Patient tolerated treatment well   Patient Left in bed;with call bell/phone within reach;with nursing/sitter in room   Nurse Communication          Time: [id number] OT Time Calculation (min): 20 min  Charges: OT General Charges $OT Visit: 1 Procedure OT Treatments $Self Care/Home Management : 8-22 mins  Thaddus Mcdowell 09/02/2014, 9:01 AM [name]  [name], OTR/L [phone] 09/02/2014

## 2014-09-02 NOTE — Clinical Social Work Note (Signed)
CSW reviewed PT evaluation which reflected that pt could return to ALF "if ALF feels they can manage" pt  CSW called [facility] and spoke with [place] about pt returning  CSW faxed paperwork to [facility] for her to review.  Lannette Donath called CSW and stated that they could not accommodate pt from the hospital and she should go to a skilled facility for at least 30 days then she could return to the ALF  CSW met with family, husband, three sons and daughter in law to discuss SNF placement.  Pt's husband lives at the ALF with pt.    CSW presented bed offers to family and son Leroy Sea will be the one to do the research about which facility the family will choose  Family is in agreement with pt going to a SNF when she is discharged from hospital  CSW will follow up with son, Leroy Sea and or son was provided week day CSW phone number to let her know what facility he chooses.  Dede Query, LCSW [facility] Worker - Weekend Coverage cell #: [phone]

## 2014-09-02 NOTE — Clinical Social Work Note (Signed)
  CSW reviewed PT evaluation which stated that pt could go back to her ALF "if ALF feels they can manage"  [facility] and spoke with RN Gershon CullPriscilla who asked that CSW fax over all information for her review  CSW faxed multiple  H & P's  PT evaluation,  OT evaluation, Md progress notes and list of current medications  MIDAS not working so CSW unable to print FL2 to fax but was able to update with new information.   CSW will continue to monitor pt until discharge.  Elray Buba.Gaberiel Youngblood, LCSW [facility] Clinical Social Worker - Weekend Coverage cell #: [phone]

## 2014-09-02 NOTE — Progress Notes (Signed)
PROGRESS NOTE  Kaitlyn Burns ZOX:[id number] DOB: Apr 03, 1925 DOA: 08/26/2014 PCP: Brooke Bonito, MD      Brief history  80 year old female with a history of diabetes mellitus, CKD stage II-III, hypertension, hypothyroidism presented from [facility] with altered mental status. Unfortunately, the patient is unable to provide any significant history. I spoke with the patient's son, [name].  He revealed to me that the patient has a long history of alcohol dependence. More recently, the patient has been drinking one to 2 glasses of wine daily. At her assisted living facility, they are allowed to keep her own alcohol in the room.  At baseline, the patient is able to make transfers from the bed to her wheelchair with which she gets around. Unfortunately, the patient has much difficulty ambulating at baseline. The patient was noted to have acute kidney injury with elevated serum creatinine and elevated point-of-care troponin in the emergency department. In addition, fecal occult blood test was also positive. The patient was also noted to have relative hypotension with systolic blood pressures in the mid 90s. Notably, the patient was admitted in January 2015 with significant anemia with hemoglobin of 4.8. She has not had any endoscopy or GI follow-up. During her January admission, the patient was given 3 units PRBC.    Assessment/Plan: Acute encephalopathy, most likely secondary to alcohol withdrawal, narcotic withdrawal, patient tachycardic, hypertensive-confusion is improved -Multifactorial including anemia, acute kidney injury, dehydration and possibly alcohol withdrawal and Wernicke's encephalopathy ammonia level   was 50, vitamin B-12 1213 -TSH--0.123 , will recheck ammonia level again tomorrow Continue thiamine supplementation -CIWA scale -use diazepam instead, lorazepam discontinued CT head negative Speech therapy recommends regular diet with thin liquids, PT/OT eval-recommended  SNF Repeat ABG shows metabolic acidosis has resolved Continue clonidine for suspected narcotic withdrawal Discontinue safety sitter today Restart diet    Anemia with positive FOBT/melena -FOBT was positive in the emergency department -I have performed rectal exam on 12/21-->melena and positive FOBT again Hemoglobin stable -08/27/2014 EGD--negative Colonoscopy shows possible ischemic colitis -hold ASA for now, avoid NSAIDs -continue protonix , GI has signed off   Leukocytosis In the setting of agitation, concerned about aspiration pneumonia Chest x-ray shows diffuse interstitial infiltrate, unlikely pneumonia Blood culture no growth so far   Alcohol dependence /narcotic dependence Extensive discussion with the son, concern about overuse of narcotics along with alcohol Patient may be in mild withdrawal from alcohol as well as narcotics, We will minimize narcotics in the hospital, start opioid withdrawal protocol Discontinue Dilaudid Continue clonidine protocol   Sinus tachycardia-resolved -may be rebound from being off diltiazem vs Etoh/opioid withdraw -TSH 0.123--although a little suppressed, suspect sick euthyroid syndrome -continue IVF Continue diltiazem  Acute on chronic renal failure (CKD2-3) -baseline creatinine 0.9-1.2 -renal us--neg for hydronephrosis Discontinue IV fluids because of pulmonary congestion, will administer one dose of IV Lasix Renal function improving, restart lisinopril  Elevated point-of-care troponin/abnormal EKG -Cycle troponins--neg x 3 -no chest pain presently  Diabetes mellitus type 2 -Hemoglobin A1c--5.8 -NovoLog sliding scale  Hypothyroidism  -TSH is 0.123 , free T4 is normal -Continue Synthroid   Relative hypotension  -improving with IVF Restart antihypertensive medications   Hypokalemia Replete potassium Magnesium 1.5, repleted   Family Communication:discussed with son [name] on phone Disposition Plan:  PT/OT/speech, discharged to SNF tomorrow bed available     Procedures/Studies: Ct Abdomen Pelvis Wo Contrast  08/26/2014   CLINICAL DATA:  Altered mental status, unable to void  EXAM: CT ABDOMEN  AND PELVIS WITHOUT CONTRAST  TECHNIQUE: Multidetector CT imaging of the abdomen and pelvis was performed following the standard protocol without IV contrast.  COMPARISON:  01/28/2010  FINDINGS: Dependent bibasilar atelectasis noted.  Unenhanced liver, adrenal glands, spleen, and pancreas are unremarkable. Motion artifact degrades imaging. Exophytic right lower renal pole 0.8 cm probable hyperdense cyst is identified, unchanged from the prior exam. Sub cm low-density bilateral renal cortical lesions elsewhere are reidentified but not further characterized on the current noncontrast exam. No free fluid or air. No lymphadenopathy.  Normal appendix, image 59. No bowel wall thickening or focal segmental dilatation is identified. Bladder is decompressed but unremarkable. Uterus not visualized. Ovaries are normal. Moderate atheromatous aortic calcification noted without aneurysm. Multilevel disc degenerative change noted in the spine.  IMPRESSION: No acute intra-abdominal or pelvic pathology.   Electronically Signed   By: Christiana PellantGretchen  Green M.D.   On: 08/26/2014 23:47   Ct Head Wo Contrast  08/26/2014   CLINICAL DATA:  Acute onset of confusion. Patient has not voided today. Initial encounter.  EXAM: CT HEAD WITHOUT CONTRAST  TECHNIQUE: Contiguous axial images were obtained from the base of the skull through the vertex without intravenous contrast.  COMPARISON:  CT of the head performed 01/17/2010  FINDINGS: There is no evidence of acute infarction, mass lesion, or intra- or extra-axial hemorrhage on CT.  Prominence of the ventricles and sulci reflects mild to moderate cortical volume loss. Cerebellar atrophy is noted. Mild periventricular and subcortical white matter change likely reflects small vessel ischemic  microangiopathy.  The brainstem and fourth ventricle are within normal limits. The basal ganglia are unremarkable in appearance. The cerebral hemispheres demonstrate grossly normal gray-white differentiation. No mass effect or midline shift is seen.  There is no evidence of fracture; visualized osseous structures are unremarkable in appearance. The orbits are within normal limits. Dense inspissated mucus is noted within the right maxillary sinus. The remaining paranasal sinuses and mastoid air cells are well-aerated. No significant soft tissue abnormalities are seen.  IMPRESSION: 1. No acute intracranial pathology seen on CT. 2. Mild to moderate cortical volume loss and scattered small vessel ischemic microangiopathy. 3. Dense inspissated mucus noted within the right maxillary sinus.   Electronically Signed   By: Roanna RaiderJeffery  Chang M.D.   On: 08/26/2014 23:46   [place] Renal  08/27/2014   CLINICAL DATA:  Subsequent encounter for acute on chronic renal failure  EXAM: RENAL/URINARY TRACT ULTRASOUND COMPLETE  COMPARISON:  CT scan from 08/26/2014.  FINDINGS: Right Kidney:  Length: 10.6 cm. Poor acoustic window limited assessment. The low-density lesion seen on the CT scan yesterday could not be visualized by ultrasound today.  Left Kidney:  Length: 9.8 cm. Echogenicity within normal limits. No mass or hydronephrosis visualized.  Bladder:  Appears normal for degree of bladder distention.  IMPRESSION: Limited study secondary to poor acoustic window bilaterally. No hydronephrosis. Right renal cyst seen on CT scan yesterday not evident on ultrasound today.   Electronically Signed   By: Kennith CenterEric  Mansell M.D.   On: 08/27/2014 10:15   Dg Chest Port 1 View  08/31/2014   CLINICAL DATA:  Leukocytosis, diabetes, hypertension, kidney disease, altered mental status  EXAM: PORTABLE CHEST - 1 VIEW  COMPARISON:  Portable exam 125 hr compared to 08/26/2014  FINDINGS: Rotated to the LEFT.  Normal heart size and mediastinal contours.   Pulmonary vascular congestion.  Atherosclerotic calcification aorta.  Scattered interstitial infiltrates diffusely new since previous exam favor pulmonary edema though infection not completely excluded.  No  gross pleural effusion or pneumothorax.  Bones demineralized.  Underlying COPD.  IMPRESSION: Diffuse interstitial infiltrates new since previous exam favor pulmonary edema over infection.   Electronically Signed   By: Ulyses SouthwardMark  Boles M.D.   On: 08/31/2014 11:52   Dg Chest Portable 1 View  08/26/2014   CLINICAL DATA:  Altered mental status, diabetes  EXAM: PORTABLE CHEST - 1 VIEW  COMPARISON:  None  FINDINGS: Heart size is within normal limits. The aorta is unfolded and ectatic. No focal pulmonary opacity. Evidence of arose origin or remote resection of the distal right clavicle. No acute osseous abnormality. No pleural effusion.  IMPRESSION: No acute cardiopulmonary process.   Electronically Signed   By: Christiana PellantGretchen  Green M.D.   On: 08/26/2014 22:37        Subjective: Patient is agitated this morning, alert and oriented to self and place  Objective: Filed Vitals:   09/01/14 1515 09/01/14 2050 09/02/14 0145 09/02/14 0427  BP: 132/82 155/64 125/41 119/57  Pulse: 99 96 81 80  Temp: 97.6 F (36.4 C) 98 F (36.7 C) 98.7 F (37.1 C) 97.6 F (36.4 C)  TempSrc: Axillary Axillary Axillary Oral  Resp: 20 20 18 18   Height:      Weight:      SpO2: 94% 96% 92% 96%    Intake/Output Summary (Last 24 hours) at 09/02/14 1038 Last data filed at 09/02/14 0958  Gross per 24 hour  Intake    420 ml  Output   1650 ml  Net  -1230 ml   Weight change:  Exam:   General:  Agitated confused and combative  HEENT: No icterus, No thrush, No meningismus, [place]/AT  Cardiovascular: RRR, S1/S2, no rubs, no gallops  Respiratory: poor inspiratory effort but clear to auscultation   Abdomen: Soft/+BS, non tender, non distended, no guarding  Extremities: No edema, No lymphangitis, No petechiae, No rashes, no  synovitis  Data Reviewed: Basic Metabolic Panel:  Recent Labs Lab 08/29/14 0540 08/30/14 0415 08/31/14 0445 09/01/14 0514 09/02/14 0428  NA 147* 146* 137 141 141  K 2.8* 2.7* 3.9 4.6 3.6  CL 123* 120* 107 108 104  CO2 16* 21 23 26 31   GLUCOSE 90 140* 161* 91 118*  BUN 20 14 8  5* 5*  CREATININE 0.91 0.79 0.75 0.61 0.74  CALCIUM 8.1* 8.2* 8.0* 7.9* 7.5*  MG 1.5  --  1.6  --   --    Liver Function Tests:  Recent Labs Lab 08/26/14 2134 08/30/14 0415 08/31/14 0445 09/02/14 0428  AST 28 29 27  34  ALT 26 33 46* 45*  ALKPHOS 152* 106 103 101  BILITOT 0.2* 0.4 0.5 0.4  PROT 6.3 5.0* 5.1* 4.7*  ALBUMIN 2.9* 2.4* 2.5* 2.5*   No results for input(s): LIPASE, AMYLASE in the last 168 hours.  Recent Labs Lab 08/28/14 1833 09/02/14 0428  AMMONIA 50* 26   CBC:  Recent Labs Lab 08/26/14 2134  08/28/14 0510 08/28/14 1106 08/29/14 0540 08/30/14 0415 08/31/14 0445 09/02/14 0428  WBC 18.0*  < > 9.9  --  11.4* 12.3* 13.1* 10.9*  NEUTROABS 13.9*  --   --   --   --   --   --   --   HGB 11.1*  < > 10.1* 9.7* 10.0* 10.1* 10.0* 8.6*  HCT 33.9*  < > 31.2* 29.6* 30.6* 31.2* 30.6* 26.3*  MCV 101.8*  < > 102.0*  --  100.0 100.3* 100.3* 100.0  PLT 295  < > 184  --  179 215 194 180  < > = values in this interval not displayed. Cardiac Enzymes:  Recent Labs Lab 08/27/14 0223 08/27/14 0755 08/27/14 1050 08/27/14 1313 08/27/14 1715 08/27/14 2255 08/28/14 0510 08/28/14 1106  CKTOTAL 61  --  54  --  54 49 35 44  CKMB 12.2*  --  9.6*  --  9.2* 12.2* 12.6* 10.8*  TROPONINI <0.30 <0.30  --  <0.30  --   --   --   --    BNP: Invalid input(s): POCBNP CBG:  Recent Labs Lab 09/01/14 2041 09/01/14 2347 09/02/14 0400 09/02/14 0801 09/02/14 0954  GLUCAP 110* 109* 123* 64* 102*    Recent Results (from the past 240 hour(s))  MRSA PCR Screening     Status: None   Collection Time: 08/27/14  1:56 AM  Result Value Ref Range Status   MRSA by PCR NEGATIVE NEGATIVE Final     Comment:        The GeneXpert MRSA Assay (FDA approved for NASAL specimens only), is one component of a comprehensive MRSA colonization surveillance program. It is not intended to diagnose MRSA infection nor to guide or monitor treatment for MRSA infections.   Culture, blood (routine x 2)     Status: None (Preliminary result)   Collection Time: 08/27/14 10:50 AM  Result Value Ref Range Status   Specimen Description BLOOD LEFT ARM  Final   Special Requests BOTTLES DRAWN AEROBIC AND ANAEROBIC [facility]  Final   Culture  Setup Time   Final    08/27/2014 13:22 Performed at [facility]    Culture   Final           BLOOD CULTURE RECEIVED NO GROWTH TO DATE CULTURE WILL BE HELD FOR 5 DAYS BEFORE ISSUING A FINAL NEGATIVE REPORT Performed at [facility]    Report Status PENDING  Incomplete  Culture, blood (routine x 2)     Status: None (Preliminary result)   Collection Time: 08/27/14 10:55 AM  Result Value Ref Range Status   Specimen Description BLOOD RIGHT ARM  Final   Special Requests BOTTLES DRAWN AEROBIC ONLY 5CC  Final   Culture  Setup Time   Final    08/27/2014 13:22 Performed at [facility]    Culture   Final           BLOOD CULTURE RECEIVED NO GROWTH TO DATE CULTURE WILL BE HELD FOR 5 DAYS BEFORE ISSUING A FINAL NEGATIVE REPORT Performed at [facility]    Report Status PENDING  Incomplete  Clostridium Difficile by PCR     Status: None   Collection Time: 08/30/14 10:17 AM  Result Value Ref Range Status   C difficile by pcr NEGATIVE NEGATIVE Final    Comment: Performed at [facility]     Scheduled Meds: . allopurinol  100 mg Oral BID  . cloNIDine  0.1 mg Oral BH-qamhs   Followed by  . [START ON 09/04/2014] cloNIDine  0.1 mg Oral QAC breakfast  . diltiazem  240 mg Oral Daily  . feeding supplement (RESOURCE BREEZE)  1 Container Oral TID BM  . folic acid  1 mg Oral Daily  . insulin aspart  0-9 Units Subcutaneous 6 times per day  .  latanoprost  1 drop Both Eyes QHS  . levothyroxine  75 mcg Oral QAC breakfast  . magnesium oxide  400 mg Oral BID  . multivitamin with minerals  1 tablet Oral Daily  . pantoprazole (PROTONIX) IV  40 mg Intravenous Q24H   Continuous Infusions:     Richarda Overlie, MD [facility] Pager [phone]  If 7PM-7AM, please contact night-coverage [email or web address] Password [email or web address] 09/02/2014, 10:38 AM   LOS: 7 days

## 2014-09-02 NOTE — Progress Notes (Signed)
Hypoglycemic Event  CBG: 64  Treatment: OJ/apple juice given after IV access lost and MD changed dietary order from NPO   Symptoms: none  Follow-up CBG: Time: 0954 CBG Result:  102  Possible Reasons for Event: pt NPO  Comments/MD notified: MD arrived during event; notified MD that pt had lost IV access and RN unable to given D50 IV. MD changed diet and OJ/apple juice was given (pt c/o OJ "burning" so finished with apple juice...slow to consume, delaying follow-up CBG)    Previn Jian, Carney BernJean B  Remember to initiate Hypoglycemia Order Set & complete

## 2014-09-03 DIAGNOSIS — F1120 Opioid dependence, uncomplicated: Secondary | ICD-10-CM | POA: Diagnosis present

## 2014-09-03 DIAGNOSIS — F10239 Alcohol dependence with withdrawal, unspecified: Secondary | ICD-10-CM | POA: Diagnosis present

## 2014-09-03 LAB — GLUCOSE, CAPILLARY
GLUCOSE-CAPILLARY: 130 mg/dL — AB (ref 70–99)
Glucose-Capillary: 102 mg/dL — ABNORMAL HIGH (ref 70–99)
Glucose-Capillary: 104 mg/dL — ABNORMAL HIGH (ref 70–99)
Glucose-Capillary: 120 mg/dL — ABNORMAL HIGH (ref 70–99)
Glucose-Capillary: 120 mg/dL — ABNORMAL HIGH (ref 70–99)
Glucose-Capillary: 131 mg/dL — ABNORMAL HIGH (ref 70–99)
Glucose-Capillary: 143 mg/dL — ABNORMAL HIGH (ref 70–99)

## 2014-09-03 LAB — VITAMIN B1

## 2014-09-03 MED ORDER — OXYCODONE HCL 5 MG PO TABS
5.0000 mg | ORAL_TABLET | Freq: Four times a day (QID) | ORAL | Status: DC | PRN
  Administered 2014-09-03 – 2014-09-04 (×2): 5 mg via ORAL
  Filled 2014-09-03 (×2): qty 1

## 2014-09-03 MED ORDER — FENTANYL 25 MCG/HR TD PT72: 25.0000 ug | MEDICATED_PATCH | TRANSDERMAL | Status: DC

## 2014-09-03 MED ORDER — TRAZODONE HCL 50 MG PO TABS: 50.0000 mg | ORAL_TABLET | Freq: Three times a day (TID) | ORAL | Status: DC | PRN

## 2014-09-03 MED ORDER — FENTANYL 25 MCG/HR TD PT72
25.0000 ug | MEDICATED_PATCH | TRANSDERMAL | Status: DC
  Administered 2014-09-03 – 2014-09-04 (×2): 25 ug via TRANSDERMAL
  Filled 2014-09-03 (×2): qty 1

## 2014-09-03 NOTE — Progress Notes (Signed)
Physical Therapy Treatment Patient Details Name: Kaitlyn GheeRobye Burns MRN: [id number] DOB: 09/01/1925 Today's Date: 09/03/2014    History of Present Illness This 80 y.o. female admitted with AMS.  She was found to have AKI, elevated troponin and hypotensive.  Dx: Acute Encephalopathy (mulitfactorial including anemia, AKI, dehydration, and possibly ETOH withdrawal and wernicke's encephalopathy.  She underwent colonoscopy which showed ischemic colitis.  PMH: ETOH abuse; HTN; Hypothyroidism.     PT Comments    Pt progressing slowly, needs SNF  Follow Up Recommendations  SNF;Supervision/Assistance - 24 hour     Equipment Recommendations  None recommended by PT    Recommendations for Other Services       Precautions / Restrictions Precautions Precautions: Fall    Mobility  Bed Mobility Overal bed mobility: Needs Assistance Bed Mobility: Supine to Sit;Sit to Supine     Supine to sit: Min assist;HOB elevated Sit to supine: Min assist   General bed mobility comments: used rail, HOB raised  Transfers Overall transfer level: Needs assistance Equipment used: 2 person hand held assist;Rolling walker (2 wheeled) Transfers: Sit to/from [employer]GI CorporationStand;Stand Pivot Transfers Sit to Stand: +2 physical assistance;Mod assist Stand pivot transfers: +2 physical assistance;Mod assist       General transfer comment: +2 for safety, wt shift; pt seemed to do better with bil HHA vs RW, possible d/t fear of falling; requires incr time and multi-modal cues  Ambulation/Gait                 Stairs            Wheelchair Mobility    Modified Rankin (Stroke Patients Only)       Balance Overall balance assessment: Needs assistance           Standing balance-Leahy Scale: Poor                      Cognition Arousal/Alertness: Awake/alert Behavior During Therapy: WFL for tasks assessed/performed Overall Cognitive Status: Within Functional Limits for tasks assessed          Following Commands: Follows one step commands consistently     Problem Solving: Requires verbal cues;Requires tactile cues General Comments: pt appropriate and able to folllow commands during PT session today    Exercises      General Comments        Pertinent Vitals/Pain Pain Assessment: No/denies pain    Home Living                      Prior Function            PT Goals (current goals can now be found in the care plan section) Acute Rehab PT Goals PT Goal Formulation: Patient unable to participate in goal setting Time For Goal Achievement: 09/13/14 Potential to Achieve Goals: Fair Progress towards PT goals: Progressing toward goals    Frequency  Min 3X/week    PT Plan Current plan remains appropriate    Co-evaluation             End of Session Equipment Utilized During Treatment: Gait belt Activity Tolerance: Patient tolerated treatment well Patient left: in bed;with call bell/phone within reach;with bed alarm set     Time: [id number] PT Time Calculation (min) (ACUTE ONLY): 19 min  Charges:  $Therapeutic Activity: 8-22 mins                    G Codes:      Kaitlyn Burns 09/03/2014, 5:19  PM   

## 2014-09-03 NOTE — Progress Notes (Addendum)
PROGRESS NOTE  Kaitlyn GheeRobye Kerman BJY:[id number] DOB: 03/21/1925 DOA: 08/26/2014 PCP: Brooke BonitoGALLEMORE,WARREN, MD        Brief history  80 year old female with a history of diabetes mellitus, CKD stage II-III, hypertension, hypothyroidism presented from [facility] with altered mental status. Unfortunately, the patient is unable to provide any significant history. I spoke with the patient's son, Nadine CountsBob.  He revealed to me that the patient has a long history of alcohol dependence. More recently, the patient has been drinking one to 2 glasses of wine daily. At her assisted living facility, they are allowed to keep her own alcohol in the room.  At baseline, the patient is able to make transfers from the bed to her wheelchair with which she gets around. Unfortunately, the patient has much difficulty ambulating at baseline. The patient was noted to have acute kidney injury with elevated serum creatinine and elevated point-of-care troponin in the emergency department. In addition, fecal occult blood test was also positive. The patient was also noted to have relative hypotension with systolic blood pressures in the mid 90s. Notably, the patient was admitted in January 2015 with significant anemia with hemoglobin of 4.8. She has not had any endoscopy or GI follow-up. During her January admission, the patient was given 3 units PRBC.    Assessment/Plan: Acute encephalopathy, most likely secondary to alcohol withdrawal, narcotic withdrawal, patient tachycardic, hypertensive-confusion is waxing and waning  -Multifactorial including anemia, acute kidney injury, dehydration and possibly alcohol withdrawal and Wernicke's encephalopathy ammonia level   was 50, vitamin B-12 1213 -TSH--0.123 , will recheck ammonia level again tomorrow Continue thiamine supplementation -CIWA scale -use diazepam instead, lorazepam discontinued as not effective  CT head negative Speech therapy recommends regular diet with thin  liquids, PT/OT eval-recommended SNF Repeat ABG shows metabolic acidosis has resolved Continue clonidine for suspected narcotic withdrawal Discontinue safety sitter 12/27 pschychiatry eval today for persistent delerium  Restart fentanyl patch   Anemia with positive FOBT/melena -FOBT was positive in the emergency department -I have performed rectal exam on 12/21-->melena and positive FOBT again Hemoglobin stable -08/27/2014 EGD--negative Colonoscopy shows possible ischemic colitis -hold ASA for now, avoid NSAIDs -continue protonix , GI has signed off    Mild  Leukocytosis In the setting of agitation, concerned about aspiration pneumonia Chest x-ray shows diffuse interstitial infiltrate, unlikely pneumonia Blood culture no growth so far   Alcohol dependence /narcotic dependence Extensive discussion with the son, concern about overuse of narcotics along with alcohol Patient may be in mild withdrawal from alcohol as well as narcotics, We will minimize narcotics in the hospital, start opioid withdrawal protocol Continue clonidine protocol Restart fentanyl patch and discontinue OxyIR after 3 doses   Sinus tachycardia-resolved -may be rebound from being off diltiazem vs Etoh/opioid withdraw -TSH 0.123--although a little suppressed, suspect sick euthyroid syndrome -continue IVF Continue diltiazem  Acute on chronic renal failure (CKD2-3) -baseline creatinine 0.9-1.2 -renal us--neg for hydronephrosis Discontinue IV fluids because of pulmonary congestion, will administer one dose of IV Lasix Renal function improving, restart lisinopril  Elevated point-of-care troponin/abnormal EKG -Cycle troponins--neg x 3 -no chest pain presently  Diabetes mellitus type 2 -Hemoglobin A1c--5.8 -NovoLog sliding scale  Hypothyroidism  -TSH is 0.123 , free T4 is normal -Continue Synthroid   Relative hypotension  -improving with IVF Restart antihypertensive  medications   Hypokalemia Replete potassium Magnesium 1.5, repleted   Family Communication:discussed with son Nadine CountsBob on phone Disposition Plan: PT/OT/speech, discharged to SNF tomorrow bed available  Procedures/Studies: Ct Abdomen Pelvis Wo Contrast  08/26/2014   CLINICAL DATA:  Altered mental status, unable to void  EXAM: CT ABDOMEN AND PELVIS WITHOUT CONTRAST  TECHNIQUE: Multidetector CT imaging of the abdomen and pelvis was performed following the standard protocol without IV contrast.  COMPARISON:  01/28/2010  FINDINGS: Dependent bibasilar atelectasis noted.  Unenhanced liver, adrenal glands, spleen, and pancreas are unremarkable. Motion artifact degrades imaging. Exophytic right lower renal pole 0.8 cm probable hyperdense cyst is identified, unchanged from the prior exam. Sub cm low-density bilateral renal cortical lesions elsewhere are reidentified but not further characterized on the current noncontrast exam. No free fluid or air. No lymphadenopathy.  Normal appendix, image 59. No bowel wall thickening or focal segmental dilatation is identified. Bladder is decompressed but unremarkable. Uterus not visualized. Ovaries are normal. Moderate atheromatous aortic calcification noted without aneurysm. Multilevel disc degenerative change noted in the spine.  IMPRESSION: No acute intra-abdominal or pelvic pathology.   Electronically Signed   By: Christiana PellantGretchen  Green M.D.   On: 08/26/2014 23:47   Ct Head Wo Contrast  08/26/2014   CLINICAL DATA:  Acute onset of confusion. Patient has not voided today. Initial encounter.  EXAM: CT HEAD WITHOUT CONTRAST  TECHNIQUE: Contiguous axial images were obtained from the base of the skull through the vertex without intravenous contrast.  COMPARISON:  CT of the head performed 01/17/2010  FINDINGS: There is no evidence of acute infarction, mass lesion, or intra- or extra-axial hemorrhage on CT.  Prominence of the ventricles and sulci reflects mild to moderate  cortical volume loss. Cerebellar atrophy is noted. Mild periventricular and subcortical white matter change likely reflects small vessel ischemic microangiopathy.  The brainstem and fourth ventricle are within normal limits. The basal ganglia are unremarkable in appearance. The cerebral hemispheres demonstrate grossly normal gray-white differentiation. No mass effect or midline shift is seen.  There is no evidence of fracture; visualized osseous structures are unremarkable in appearance. The orbits are within normal limits. Dense inspissated mucus is noted within the right maxillary sinus. The remaining paranasal sinuses and mastoid air cells are well-aerated. No significant soft tissue abnormalities are seen.  IMPRESSION: 1. No acute intracranial pathology seen on CT. 2. Mild to moderate cortical volume loss and scattered small vessel ischemic microangiopathy. 3. Dense inspissated mucus noted within the right maxillary sinus.   Electronically Signed   By: Roanna RaiderJeffery  Chang M.D.   On: 08/26/2014 23:46   [place]as Renal  08/27/2014   CLINICAL DATA:  Subsequent encounter for acute on chronic renal failure  EXAM: RENAL/URINARY TRACT ULTRASOUND COMPLETE  COMPARISON:  CT scan from 08/26/2014.  FINDINGS: Right Kidney:  Length: 10.6 cm. Poor acoustic window limited assessment. The low-density lesion seen on the CT scan yesterday could not be visualized by ultrasound today.  Left Kidney:  Length: 9.8 cm. Echogenicity within normal limits. No mass or hydronephrosis visualized.  Bladder:  Appears normal for degree of bladder distention.  IMPRESSION: Limited study secondary to poor acoustic window bilaterally. No hydronephrosis. Right renal cyst seen on CT scan yesterday not evident on ultrasound today.   Electronically Signed   By: Kennith CenterEric  Mansell M.D.   On: 08/27/2014 10:15   Dg Chest Port 1 View  08/31/2014   CLINICAL DATA:  Leukocytosis, diabetes, hypertension, kidney disease, altered mental status  EXAM: PORTABLE CHEST - 1  VIEW  COMPARISON:  Portable exam 125 hr compared to 08/26/2014  FINDINGS: Rotated to the LEFT.  Normal heart size and mediastinal contours.  Pulmonary vascular congestion.  Atherosclerotic calcification aorta.  Scattered interstitial infiltrates diffusely new since previous exam favor pulmonary edema though infection not completely excluded.  No gross pleural effusion or pneumothorax.  Bones demineralized.  Underlying COPD.  IMPRESSION: Diffuse interstitial infiltrates new since previous exam favor pulmonary edema over infection.   Electronically Signed   By: Ulyses Southward M.D.   On: 08/31/2014 11:52   Dg Chest Portable 1 View  08/26/2014   CLINICAL DATA:  Altered mental status, diabetes  EXAM: PORTABLE CHEST - 1 VIEW  COMPARISON:  None  FINDINGS: Heart size is within normal limits. The aorta is unfolded and ectatic. No focal pulmonary opacity. Evidence of arose origin or remote resection of the distal right clavicle. No acute osseous abnormality. No pleural effusion.  IMPRESSION: No acute cardiopulmonary process.   Electronically Signed   By: Christiana Pellant M.D.   On: 08/26/2014 22:37        Subjective: Patient is agitated this morning, alert and oriented to self and place, complaining of pain in both knees  Objective: Filed Vitals:   09/03/14 0200 09/03/14 0627 09/03/14 0755 09/03/14 1002  BP: 118/44 128/50 125/58 130/62  Pulse: 78 81    Temp: 99 F (37.2 C) 98.2 F (36.8 C)    TempSrc: Oral Oral    Resp: 20 20    Height:      Weight:      SpO2: 91% 95%      Intake/Output Summary (Last 24 hours) at 09/03/14 1054 Last data filed at 09/03/14 4098  Gross per 24 hour  Intake    480 ml  Output   1900 ml  Net  -1420 ml   Weight change:  Exam:   General:  Agitated confused and combative  HEENT: No icterus, No thrush, No meningismus, [place]/AT  Cardiovascular: RRR, S1/S2, no rubs, no gallops  Respiratory: poor inspiratory effort but clear to auscultation   Abdomen: Soft/+BS, non  tender, non distended, no guarding  Extremities: No edema, No lymphangitis, No petechiae, No rashes, no synovitis  Data Reviewed: Basic Metabolic Panel:  Recent Labs Lab 08/29/14 0540 08/30/14 0415 08/31/14 0445 09/01/14 0514 09/02/14 0428  NA 147* 146* 137 141 141  K 2.8* 2.7* 3.9 4.6 3.6  CL 123* 120* 107 108 104  CO2 16* 21 23 26 31   GLUCOSE 90 140* 161* 91 118*  BUN 20 14 8  5* 5*  CREATININE 0.91 0.79 0.75 0.61 0.74  CALCIUM 8.1* 8.2* 8.0* 7.9* 7.5*  MG 1.5  --  1.6  --   --    Liver Function Tests:  Recent Labs Lab 08/30/14 0415 08/31/14 0445 09/02/14 0428  AST 29 27 34  ALT 33 46* 45*  ALKPHOS 106 103 101  BILITOT 0.4 0.5 0.4  PROT 5.0* 5.1* 4.7*  ALBUMIN 2.4* 2.5* 2.5*   No results for input(s): LIPASE, AMYLASE in the last 168 hours.  Recent Labs Lab 08/28/14 1833 09/02/14 0428  AMMONIA 50* 26   CBC:  Recent Labs Lab 08/28/14 0510 08/28/14 1106 08/29/14 0540 08/30/14 0415 08/31/14 0445 09/02/14 0428  WBC 9.9  --  11.4* 12.3* 13.1* 10.9*  HGB 10.1* 9.7* 10.0* 10.1* 10.0* 8.6*  HCT 31.2* 29.6* 30.6* 31.2* 30.6* 26.3*  MCV 102.0*  --  100.0 100.3* 100.3* 100.0  PLT 184  --  179 215 194 180   Cardiac Enzymes:  Recent Labs Lab 08/27/14 1313 08/27/14 1715 08/27/14 2255 08/28/14 0510 08/28/14 1106  CKTOTAL  --  54 49 35 44  CKMB  --  9.2* 12.2* 12.6* 10.8*  TROPONINI <0.30  --   --   --   --    BNP: Invalid input(s): POCBNP CBG:  Recent Labs Lab 09/02/14 1657 09/02/14 2020 09/03/14 0023 09/03/14 0447 09/03/14 0737  GLUCAP 156* 143* 102* 131* 104*    Recent Results (from the past 240 hour(s))  MRSA PCR Screening     Status: None   Collection Time: 08/27/14  1:56 AM  Result Value Ref Range Status   MRSA by PCR NEGATIVE NEGATIVE Final    Comment:        The GeneXpert MRSA Assay (FDA approved for NASAL specimens only), is one component of a comprehensive MRSA colonization surveillance program. It is not intended to  diagnose MRSA infection nor to guide or monitor treatment for MRSA infections.   Culture, blood (routine x 2)     Status: None   Collection Time: 08/27/14 10:50 AM  Result Value Ref Range Status   Specimen Description BLOOD LEFT ARM  Final   Special Requests BOTTLES DRAWN AEROBIC AND ANAEROBIC [facility]  Final   Culture  Setup Time   Final    08/27/2014 13:22 Performed at [facility]    Culture   Final    NO GROWTH 5 DAYS Performed at [facility]    Report Status 09/02/2014 FINAL  Final  Culture, blood (routine x 2)     Status: None   Collection Time: 08/27/14 10:55 AM  Result Value Ref Range Status   Specimen Description BLOOD RIGHT ARM  Final   Special Requests BOTTLES DRAWN AEROBIC ONLY 5CC  Final   Culture  Setup Time   Final    08/27/2014 13:22 Performed at [facility]    Culture   Final    NO GROWTH 5 DAYS Performed at [facility]    Report Status 09/02/2014 FINAL  Final  Clostridium Difficile by PCR     Status: None   Collection Time: 08/30/14 10:17 AM  Result Value Ref Range Status   C difficile by pcr NEGATIVE NEGATIVE Final    Comment: Performed at [facility]     Scheduled Meds: . allopurinol  100 mg Oral BID  . cloNIDine  0.1 mg Oral BH-qamhs   Followed by  . [START ON 09/04/2014] cloNIDine  0.1 mg Oral QAC breakfast  . diltiazem  240 mg Oral Daily  . feeding supplement (RESOURCE BREEZE)  1 Container Oral TID BM  . fentaNYL  25 mcg Transdermal Q72H  . folic acid  1 mg Oral Daily  . insulin aspart  0-9 Units Subcutaneous 6 times per day  . latanoprost  1 drop Both Eyes QHS  . levothyroxine  75 mcg Oral QAC breakfast  . magnesium oxide  400 mg Oral BID  . multivitamin with minerals  1 tablet Oral Daily  . pantoprazole  40 mg Oral Daily   Continuous Infusions:     Richarda Overlie, MD [facility] Pager [phone]  If 7PM-7AM, please contact night-coverage [email or web address] Password [email or web address] 09/03/2014, 10:54  AM   LOS: 8 days

## 2014-09-03 NOTE — Progress Notes (Signed)
Occupational Therapy Treatment Patient Details Name: Kaitlyn Burns MRN: [id number] DOB: 03/11/1925 Today's Date: 09/03/2014    History of present illness This 80 y.o. female admitted with AMS.  She was found to have AKI, elevated troponin and hypotensive.  Dx: Acute Encephalopathy (mulitfactorial including anemia, AKI, dehydration, and possibly ETOH withdrawal and wernicke's encephalopathy.  She underwent colonoscopy which showed ischemic colitis.  PMH: ETOH abuse; HTN; Hypothyroidism.    OT comments  Pt more clear today and participated well.  Unable to stand with A x 1  Follow Up Recommendations  SNF    Equipment Recommendations   (possibly drop arm commode)    Recommendations for Other Services      Precautions / Restrictions Precautions Precautions: Fall       Mobility Bed Mobility         Supine to sit: Min assist;HOB elevated     General bed mobility comments: used rail, HOB raised  Transfers Overall transfer level: Needs assistance     Sit to Stand: Max assist   Squat pivot transfers: Mod assist     General transfer comment: could not stand completely with A x 1    Balance                                   ADL       Grooming: Hair;Oral care;Sitting;Set up                   Toilet Transfer: Moderate assistance;Squat-pivot (to recliner)             General ADL Comments: donned new socks with mod A from chair:  pt crosses LLE but unable to cross R comfortably:  she states she has a bad knee.  She says back pain is constant: nothing aggravated it nor made it better      Vision                     Perception     Praxis      Cognition   Behavior During Therapy: [facility] for tasks assessed/performed Overall Cognitive Status: No family/caregiver present to determine baseline cognitive functioning     Current Attention Level: Sustained    Following Commands: Follows one step commands consistently       General  Comments: pt more clear today:  still decreased awareness of deficits.      Extremity/Trunk Assessment               Exercises     Shoulder Instructions       General Comments      Pertinent Vitals/ Pain       Pain Assessment: Faces Faces Pain Scale: Hurts a little bit Pain Location: back Pain Intervention(s): Premedicated before session;Limited activity within patient's tolerance;Monitored during session;Repositioned  Home Living                                          Prior Functioning/Environment              Frequency       Progress Toward Goals  OT Goals(current goals can now be found in the care plan section)  Progress towards OT goals: Progressing toward goals (goals updated today)  ADL Goals Pt Will Perform Grooming:  (discontinue) Pt Will  Perform Upper Body Bathing: with set-up;sitting Pt Will Perform Lower Body Bathing: with mod assist;sitting/lateral leans Pt Will Transfer to Toilet: with min assist;squat pivot transfer;bedside commode (drop arm) Pt Will Perform Toileting - Clothing Manipulation and hygiene:  (discontinue) Additional ADL Goal #1: Pt will stand for 1 minute with mod A x 2 for adls  Plan      Co-evaluation                 End of Session     Activity Tolerance Patient tolerated treatment well   Patient Left in chair;with call bell/phone within reach;with chair alarm set   Nurse Communication Mobility status;Need for lift equipment        Time: [id number] OT Time Calculation (min): 30 min  Charges: OT General Charges $OT Visit: 1 Procedure OT Treatments $Self Care/Home Management : 8-22 mins $Therapeutic Activity: 8-22 mins  Karalyne Nusser 09/03/2014, 11:16 AM  Marica OtterMaryellen Ashaz Robling, OTR/L [phone] 09/03/2014

## 2014-09-03 NOTE — Consult Note (Signed)
[facility] Face-to-Face Psychiatry Consult   Reason for Consult:  Alcohol detox/delirium Referring Physician:  EDP  Kaitlyn Burns is an 80 y.o. female. Total Time spent with patient: 45 minutes  Assessment: AXIS I:  Alcohol dependence with complicated withdrawal; opiate dependence AXIS II:  Deferred AXIS III:   Past Medical History  Diagnosis Date  . Diabetes mellitus   . Thyroid disease   . Complication of anesthesia     trouble intubating surgery  . Difficult intubation   . Arthritis    AXIS IV:  chronic physical issues and substance dependency AXIS V:  51-60 moderate symptoms  Plan:  Supportive therapy provided about ongoing stressors. Recommend discontinuation of Valium every six hours PRN for withdrawal symptoms.  Ativan 0.5 mg to 1 mg PRN every six hours PRN for alcohol withdrawal symptoms recommended instead.  She may have withdrawal from Valium and need some Ativan or taper.  Patient is transferring to rehab, if anytime soon, the Ativan PRN should be in place for any withdrawal symptoms at discharge.  After detox, please avoid benzodiazepine use.  Dr. Sabra Heck reviewed the patient and concurs with the plan.  Subjective:   Kaitlyn Burns is a 80 y.o. female patient does not warrant admission.  HPI:  The patient lives in an assisted living facility with her husband.  She has been having her 4 oz. Of wine at lunch and dinner along with her husband's 4 oz.  Her husband also brings alcohol into the facility for her.  [name] has been on opiate medications for a "long time" for her back pain.  Her husband, son, and daughter-in-law are at her bedside. HPI Elements:   Location:  generalized. Quality:  chronic. Severity:  moderate. Timing:  intermittent. Duration:  years. Context:  physical issues.  Past Psychiatric History: Past Medical History  Diagnosis Date  . Diabetes mellitus   . Thyroid disease   . Complication of anesthesia     trouble intubating surgery  . Difficult intubation    . Arthritis     reports that she has never smoked. She has never used smokeless tobacco. She reports that she drinks alcohol. She reports that she does not use illicit drugs. History reviewed. No pertinent family history.       Abuse/Neglect [facility]) Physical Abuse: Denies Verbal Abuse: Denies Sexual Abuse: Denies Allergies:   Allergies  Allergen Reactions  . Gabapentin     unknown  . Lorazepam     unknown  . Metoclopramide     unknown  . Penicillins     unknown  . Tape     unknown  . Teflaro [Ceftaroline]     unknown    ACT Assessment Complete:  No:   Past Psychiatric History: Diagnosis:  dep  Hospitalizations:  One medical detox but not a psychiatric hospitalization or rehab  Outpatient Care:  No psychiatric outpatient care  Substance Abuse Care:  None  Self-Mutilation:  Non  Suicidal Attempts:  None  Homicidal Behaviors:  None   Violent Behaviors:  None   Place of Residence:  [place], [place] Marital Status:  Married Employed/Unemployed:  Retired Education:  [employer] Family Supports:  2 sons, daughter-in-laws, husband  Objective: Blood pressure 129/53, pulse 85, temperature 98.1 F (36.7 C), temperature source Oral, resp. rate 18, height _0  (1.549 m), weight 132 lb 4.4 oz (60 kg), SpO2 100 %.Body mass index is 25.01 kg/(m^2). Results for orders placed or performed during the hospital encounter of 08/26/14 (from the past  72 hour(s))  Glucose, capillary     Status: None   Collection Time: 09/01/14 12:06 AM  Result Value Ref Range   Glucose-Capillary 94 70 - 99 mg/dL  Glucose, capillary     Status: None   Collection Time: 09/01/14  4:21 AM  Result Value Ref Range   Glucose-Capillary 92 70 - 99 mg/dL  Basic metabolic panel     Status: Abnormal   Collection Time: 09/01/14  5:14 AM  Result Value Ref Range   Sodium 141 135 - 145 mmol/L    Comment: Please note change in reference range.   Potassium 4.6 3.5 - 5.1 mmol/L    Comment: Please note  change in reference range.   Chloride 108 96 - 112 mEq/L   CO2 26 19 - 32 mmol/L   Glucose, Bld 91 70 - 99 mg/dL   BUN 5 (L) 6 - 23 mg/dL   Creatinine, Ser 0.61 0.50 - 1.10 mg/dL   Calcium 7.9 (L) 8.4 - 10.5 mg/dL   GFR calc non Af Amer 79 (L) >90 mL/min   GFR calc Af Amer >90 >90 mL/min    Comment: (NOTE) The eGFR has been calculated using the CKD EPI equation. This calculation has not been validated in all clinical situations. eGFR's persistently <90 mL/min signify possible Chronic Kidney Disease.    Anion gap 7 5 - 15  Glucose, capillary     Status: None   Collection Time: 09/01/14  7:54 AM  Result Value Ref Range   Glucose-Capillary 77 70 - 99 mg/dL  Glucose, capillary     Status: Abnormal   Collection Time: 09/01/14 12:19 PM  Result Value Ref Range   Glucose-Capillary 157 (H) 70 - 99 mg/dL  Glucose, capillary     Status: Abnormal   Collection Time: 09/01/14  4:43 PM  Result Value Ref Range   Glucose-Capillary 107 (H) 70 - 99 mg/dL  Glucose, capillary     Status: Abnormal   Collection Time: 09/01/14  8:41 PM  Result Value Ref Range   Glucose-Capillary 110 (H) 70 - 99 mg/dL  Glucose, capillary     Status: Abnormal   Collection Time: 09/01/14 11:47 PM  Result Value Ref Range   Glucose-Capillary 109 (H) 70 - 99 mg/dL  Glucose, capillary     Status: Abnormal   Collection Time: 09/02/14  4:00 AM  Result Value Ref Range   Glucose-Capillary 123 (H) 70 - 99 mg/dL  Comprehensive metabolic panel     Status: Abnormal   Collection Time: 09/02/14  4:28 AM  Result Value Ref Range   Sodium 141 135 - 145 mmol/L    Comment: Please note change in reference range.   Potassium 3.6 3.5 - 5.1 mmol/L    Comment: Please note change in reference range. DELTA CHECK NOTED    Chloride 104 96 - 112 mEq/L   CO2 31 19 - 32 mmol/L   Glucose, Bld 118 (H) 70 - 99 mg/dL   BUN 5 (L) 6 - 23 mg/dL   Creatinine, Ser 0.74 0.50 - 1.10 mg/dL   Calcium 7.5 (L) 8.4 - 10.5 mg/dL   Total Protein 4.7 (L)  6.0 - 8.3 g/dL   Albumin 2.5 (L) 3.5 - 5.2 g/dL   AST 34 0 - 37 U/L   ALT 45 (H) 0 - 35 U/L   Alkaline Phosphatase 101 39 - 117 U/L   Total Bilirubin 0.4 0.3 - 1.2 mg/dL   GFR calc non Af Amer 74 (L) >90  mL/min   GFR calc Af Amer 86 (L) >90 mL/min    Comment: (NOTE) The eGFR has been calculated using the CKD EPI equation. This calculation has not been validated in all clinical situations. eGFR's persistently <90 mL/min signify possible Chronic Kidney Disease.    Anion gap 6 5 - 15  Ammonia     Status: None   Collection Time: 09/02/14  4:28 AM  Result Value Ref Range   Ammonia 26 11 - 32 umol/L    Comment: Please note change in reference range.  CBC     Status: Abnormal   Collection Time: 09/02/14  4:28 AM  Result Value Ref Range   WBC 10.9 (H) 4.0 - 10.5 K/uL   RBC 2.63 (L) 3.87 - 5.11 MIL/uL   Hemoglobin 8.6 (L) 12.0 - 15.0 g/dL   HCT 26.3 (L) 36.0 - 46.0 %   MCV 100.0 78.0 - 100.0 fL   MCH 32.7 26.0 - 34.0 pg   MCHC 32.7 30.0 - 36.0 g/dL   RDW 14.7 11.5 - 15.5 %   Platelets 180 150 - 400 K/uL  Glucose, capillary     Status: Abnormal   Collection Time: 09/02/14  8:01 AM  Result Value Ref Range   Glucose-Capillary 64 (L) 70 - 99 mg/dL  Glucose, capillary     Status: Abnormal   Collection Time: 09/02/14  9:54 AM  Result Value Ref Range   Glucose-Capillary 102 (H) 70 - 99 mg/dL  Glucose, capillary     Status: Abnormal   Collection Time: 09/02/14 12:01 PM  Result Value Ref Range   Glucose-Capillary 119 (H) 70 - 99 mg/dL  Glucose, capillary     Status: Abnormal   Collection Time: 09/02/14  4:57 PM  Result Value Ref Range   Glucose-Capillary 156 (H) 70 - 99 mg/dL   Comment 1 Documented in Chart    Comment 2 Notify RN   Glucose, capillary     Status: Abnormal   Collection Time: 09/02/14  8:20 PM  Result Value Ref Range   Glucose-Capillary 143 (H) 70 - 99 mg/dL   Comment 1 Documented in Chart    Comment 2 Notify RN   Glucose, capillary     Status: Abnormal    Collection Time: 09/03/14 12:23 AM  Result Value Ref Range   Glucose-Capillary 102 (H) 70 - 99 mg/dL   Comment 1 Documented in Chart    Comment 2 Notify RN   Glucose, capillary     Status: Abnormal   Collection Time: 09/03/14  4:47 AM  Result Value Ref Range   Glucose-Capillary 131 (H) 70 - 99 mg/dL  Glucose, capillary     Status: Abnormal   Collection Time: 09/03/14  7:37 AM  Result Value Ref Range   Glucose-Capillary 104 (H) 70 - 99 mg/dL  Glucose, capillary     Status: Abnormal   Collection Time: 09/03/14 12:19 PM  Result Value Ref Range   Glucose-Capillary 120 (H) 70 - 99 mg/dL  Glucose, capillary     Status: Abnormal   Collection Time: 09/03/14  4:36 PM  Result Value Ref Range   Glucose-Capillary 120 (H) 70 - 99 mg/dL   Comment 1 Documented in Chart    Comment 2 Notify RN   Glucose, capillary     Status: Abnormal   Collection Time: 09/03/14  8:14 PM  Result Value Ref Range   Glucose-Capillary 130 (H) 70 - 99 mg/dL   Labs are reviewed and are pertinent for medical issues  being treated.  Current Facility-Administered Medications  Medication Dose Route Frequency Provider Last Rate Last Dose  . acetaminophen (TYLENOL) tablet 650 mg  650 mg Oral Q6H PRN Theressa Millard, MD   650 mg at 08/30/14 2306   Or  . acetaminophen (TYLENOL) suppository 650 mg  650 mg Rectal Q6H PRN Theressa Millard, MD      . allopurinol (ZYLOPRIM) tablet 100 mg  100 mg Oral BID Reyne Dumas, MD   100 mg at 09/03/14 2340  . alum & mag hydroxide-simeth (MAALOX/MYLANTA) 200-200-20 MG/5ML suspension 30 mL  30 mL Oral Q6H PRN Theressa Millard, MD      . cloNIDine (CATAPRES) tablet 0.1 mg  0.1 mg Oral BH-qamhs Reyne Dumas, MD   0.1 mg at 09/03/14 0755   Followed by  . [START ON 09/04/2014] cloNIDine (CATAPRES) tablet 0.1 mg  0.1 mg Oral QAC breakfast Reyne Dumas, MD      . diazepam (VALIUM) injection 2 mg  2 mg Intramuscular Q6H PRN Reyne Dumas, MD   2 mg at 09/03/14 0715  . diltiazem (DILACOR XR)  24 hr capsule 240 mg  240 mg Oral Daily Reyne Dumas, MD   240 mg at 09/03/14 1002  . feeding supplement (RESOURCE BREEZE) (RESOURCE BREEZE) liquid 1 Container  1 Container Oral TID BM Reyne Dumas, MD   1 Container at 09/03/14 1000  . fentaNYL (DURAGESIC - dosed mcg/hr) patch 25 mcg  25 mcg Transdermal Q72H Reyne Dumas, MD   25 mcg at 09/03/14 1128  . folic acid (FOLVITE) tablet 1 mg  1 mg Oral Daily Orson Eva, MD   1 mg at 09/03/14 1002  . hydrocortisone (ANUSOL-HC) suppository 25 mg  25 mg Rectal BID PRN Reyne Dumas, MD      . hydrOXYzine (ATARAX/VISTARIL) tablet 25 mg  25 mg Oral Q6H PRN Reyne Dumas, MD   25 mg at 09/03/14 1228  . insulin aspart (novoLOG) injection 0-9 Units  0-9 Units Subcutaneous 6 times per day Theressa Millard, MD   1 Units at 09/03/14 2103  . latanoprost (XALATAN) 0.005 % ophthalmic solution 1 drop  1 drop Both Eyes QHS Reyne Dumas, MD   1 drop at 09/03/14 2340  . levothyroxine (SYNTHROID, LEVOTHROID) tablet 75 mcg  75 mcg Oral QAC breakfast Orson Eva, MD   75 mcg at 09/03/14 0754  . magnesium oxide (MAG-OX) tablet 400 mg  400 mg Oral BID Reyne Dumas, MD   400 mg at 09/03/14 2342  . multivitamin with minerals tablet 1 tablet  1 tablet Oral Daily Orson Eva, MD   1 tablet at 09/03/14 1002  . ondansetron (ZOFRAN) tablet 4 mg  4 mg Oral Q6H PRN Theressa Millard, MD   4 mg at 08/28/14 1159   Or  . ondansetron (ZOFRAN) injection 4 mg  4 mg Intravenous Q6H PRN Theressa Millard, MD   4 mg at 08/28/14 2030  . ondansetron (ZOFRAN-ODT) disintegrating tablet 4 mg  4 mg Oral Q6H PRN Reyne Dumas, MD      . oxyCODONE (Oxy IR/ROXICODONE) immediate release tablet 5 mg  5 mg Oral Q6H PRN Reyne Dumas, MD   5 mg at 09/03/14 2340  . pantoprazole (PROTONIX) EC tablet 40 mg  40 mg Oral Daily Reyne Dumas, MD        Psychiatric Specialty Exam:     Blood pressure 129/53, pulse 85, temperature 98.1 F (36.7 C), temperature source Oral, resp. rate 18, height _0  (1.549 m),  weight  132 lb 4.4 oz (60 kg), SpO2 100 %.Body mass index is 25.01 kg/(m^2).  General Appearance: Casual  Eye Contact::  Good  Speech:  Normal Rate  Volume:  Normal  Mood:  Anxious at times, low level  Affect:  Blunt  Thought Process:  Coherent  Orientation:  Full (Time, Place, and Person)  Thought Content:  WDL  Suicidal Thoughts:  No  Homicidal Thoughts:  No  Memory:  Immediate;   Good Recent;   Fair Remote;   Fair  Judgement:  Impaired  Insight:  Lacking  Psychomotor Activity:  Decreased  Concentration:  Fair  Recall:  [place] of Knowledge:Good  Language: Good  Akathisia:  No  Handed:  Right  AIMS (if indicated):     Assets:  Financial Resources/Insurance Housing Intimacy Leisure Time Resilience Social Support Transportation  Sleep:      Musculoskeletal: Strength & Muscle Tone: within normal limits Gait & Station: did not observe Patient leans: N/A  Treatment Plan Summary: Supportive therapy provided about ongoing stressors. Recommend discontinuation of Valium every six hours PRN for withdrawal symptoms.  Ativan 0.5 mg to 1 mg PRN every six hours PRN for alcohol withdrawal symptoms recommended instead.  She may have withdrawal from Valium and need some Ativan or taper.  Patient is transferring to rehab, if anytime soon, the Ativan PRN should be in place for any withdrawal symptoms at discharge.  After detox, please avoid benzodiazepine use.  Dr. Sabra Heck reviewed the patient and concurs with the plan.  Waylan Boga, [place]  09/03/2014 11:59 PM  I have been consulted about this patient and agree with the assessment and plan Geralyn Flash A. [place].D.

## 2014-09-04 ENCOUNTER — Inpatient Hospital Stay (HOSPITAL_COMMUNITY): Payer: Medicare Other

## 2014-09-04 DIAGNOSIS — F10239 Alcohol dependence with withdrawal, unspecified: Secondary | ICD-10-CM

## 2014-09-04 DIAGNOSIS — F1120 Opioid dependence, uncomplicated: Secondary | ICD-10-CM

## 2014-09-04 LAB — GLUCOSE, CAPILLARY
GLUCOSE-CAPILLARY: 109 mg/dL — AB (ref 70–99)
GLUCOSE-CAPILLARY: 119 mg/dL — AB (ref 70–99)
Glucose-Capillary: 92 mg/dL (ref 70–99)
Glucose-Capillary: 121 mg/dL — ABNORMAL HIGH (ref 70–99)

## 2014-09-04 MED ORDER — LORAZEPAM 0.5 MG PO TABS
0.5000 mg | ORAL_TABLET | Freq: Four times a day (QID) | ORAL | Status: DC | PRN
  Administered 2014-09-04 (×2): 0.5 mg via ORAL
  Filled 2014-09-04 (×2): qty 1

## 2014-09-04 MED ORDER — FENTANYL 25 MCG/HR TD PT72: 25.0000 ug | MEDICATED_PATCH | TRANSDERMAL | Status: DC

## 2014-09-04 MED ORDER — TRAMADOL HCL 50 MG PO TABS: 50.0000 mg | ORAL_TABLET | Freq: Four times a day (QID) | ORAL | Status: DC | PRN

## 2014-09-04 MED ORDER — PANTOPRAZOLE SODIUM 40 MG PO TBEC: 40.0000 mg | DELAYED_RELEASE_TABLET | Freq: Every day | ORAL | Status: DC

## 2014-09-04 MED ORDER — FOLIC ACID 1 MG PO TABS: 1.0000 mg | ORAL_TABLET | Freq: Every day | ORAL | Status: DC

## 2014-09-04 MED ORDER — LORAZEPAM 0.5 MG PO TABS: 0.5000 mg | ORAL_TABLET | Freq: Four times a day (QID) | ORAL | Status: DC | PRN

## 2014-09-04 MED ORDER — FENTANYL 25 MCG/HR TD PT72: 25.0000 ug | MEDICATED_PATCH | Freq: Once | TRANSDERMAL | Status: DC

## 2014-09-04 MED ORDER — MAGNESIUM OXIDE 400 (241.3 MG) MG PO TABS: 400.0000 mg | ORAL_TABLET | Freq: Two times a day (BID) | ORAL | Status: DC

## 2014-09-04 MED ORDER — LORAZEPAM 2 MG/ML IJ SOLN: 0.5000 mg | Freq: Four times a day (QID) | INTRAMUSCULAR | Status: DC | PRN

## 2014-09-04 NOTE — Progress Notes (Signed)
Fentanyl patch on right upper back was applied yesterday.  When logrolling patient in bed, patch was missing on back and not found in patient's bed.  Patient's skin thoroughly checked and no signs of pain patch.  New patch applied after speaking with pharmacist.

## 2014-09-04 NOTE — Progress Notes (Signed)
Patient complaining of pain in right mandible.  Edema noted at site as well. Patient pulls away when site is touched.  MD made aware.  New orders received.

## 2014-09-04 NOTE — Progress Notes (Signed)
[facility] [place] be [place].  Patient's son, Nida BoatmanBrad, was called and informed him of patient leaving via ambulance at this time.

## 2014-09-04 NOTE — Discharge Summary (Addendum)
Physician Discharge Summary  Kaitlyn Burns MRN: [id number] DOB/AGE: 1924/09/08 80 y.o.  PCP: Brooke Bonito, MD   Admit date: 08/26/2014 Discharge date: 09/04/2014  Discharge Diagnoses:     Rectal bleeding secondary to ischemic colitis Alcohol dependence with complicated withdrawal; opiate dependence   Acute encephalopathy   AKI (acute kidney injury)   Hypotension   NSTEMI (non-ST elevated myocardial infarction)   Elevated troponin   Metabolic acidosis   Diabetes mellitus   Acute on chronic renal failure   Noninfectious gastroenteritis and colitis   Alcohol dependence with withdrawal with complication   Opiate dependence   Follow-up recommendations Follow-up with PCP in 5-7 days Follow-up BMP, CBC, magnesium, Depakote in 2-3 days      Medication List    STOP taking these medications        BIOFREEZE 4 % Gel  Generic drug:  Menthol (Topical Analgesic)     lisinopril 2.5 MG tablet  Commonly known as:  PRINIVIL,ZESTRIL     meclizine 25 MG tablet  Commonly known as:  ANTIVERT     Melatonin 3 MG Tabs     oxyCODONE-acetaminophen 5-325 MG per tablet  Commonly known as:  PERCOCET/ROXICET     venlafaxine XR 150 MG 24 hr capsule  Commonly known as:  EFFEXOR-XR      TAKE these medications        allopurinol 100 MG tablet  Commonly known as:  ZYLOPRIM  Take 100 mg by mouth 2 (two) times daily.     beta carotene w/minerals tablet  Take 1 tablet by mouth 2 (two) times daily.     calcium-vitamin D 500-200 MG-UNIT per tablet  Commonly known as:  OSCAL WITH D  Take 1 tablet by mouth 2 (two) times daily.     cholecalciferol 1000 UNITS tablet  Commonly known as:  VITAMIN D  Take 1,000 Units by mouth daily.     diltiazem 240 MG 24 hr capsule  Commonly known as:  DILACOR XR  Take 240 mg by mouth daily.     divalproex 250 MG 24 hr tablet  Commonly known as:  DEPAKOTE ER  Take 250-500 mg by mouth 2 (two) times daily. 1 tablet in the am and 2 tablets in the  pm     feeding supplement (PRO-STAT SUGAR FREE 64) Liqd  Take 30 mLs by mouth daily.     feeding supplement Liqd  Take 237 mLs by mouth daily.     fentaNYL 25 MCG/HR patch  Commonly known as:  DURAGESIC - dosed mcg/hr  Place 1 patch (25 mcg total) onto the skin every 3 (three) days.     ferrous sulfate 325 (65 FE) MG tablet  Take 1 tablet (325 mg total) by mouth 3 (three) times daily with meals.     folic acid 1 MG tablet  Commonly known as:  FOLVITE  Take 1 tablet (1 mg total) by mouth daily.     hydrocortisone 25 MG suppository  Commonly known as:  ANUSOL-HC  Place 25 mg rectally 2 (two) times daily as needed for hemorrhoids.     insulin glargine 100 UNIT/ML injection  Commonly known as:  LANTUS  Inject 8 Units into the skin at bedtime.     latanoprost 0.005 % ophthalmic solution  Commonly known as:  XALATAN  Place 1 drop into both eyes at bedtime.     levothyroxine 75 MCG tablet  Commonly known as:  SYNTHROID, LEVOTHROID  Take 75 mcg by mouth daily before breakfast.  LORazepam 0.5 MG tablet  Commonly known as:  ATIVAN  Take 1 tablet (0.5 mg total) by mouth every 6 (six) hours as needed for anxiety.     magnesium oxide 400 (241.3 MG) MG tablet  Commonly known as:  MAG-OX  Take 1 tablet (400 mg total) by mouth 2 (two) times daily.     omeprazole 20 MG capsule  Commonly known as:  PRILOSEC  Take 20 mg by mouth daily.     pantoprazole 40 MG tablet  Commonly known as:  PROTONIX  Take 1 tablet (40 mg total) by mouth daily.     psyllium 0.52 G capsule  Commonly known as:  REGULOID  Take 0.52 g by mouth at bedtime.     traMADol 50 MG tablet  Commonly known as:  ULTRAM  Take 1 tablet (50 mg total) by mouth every 6 (six) hours as needed.     traZODone 50 MG tablet  Commonly known as:  DESYREL  Take 1 tablet (50 mg total) by mouth 3 (three) times daily as needed for sleep.        Discharge Condition: Stable  Disposition: 04-Intermediate Care  Facility   Consults: Psychiatry Gastroenterology    Significant Diagnostic Studies: Ct Abdomen Pelvis Wo Contrast  08/26/2014   CLINICAL DATA:  Altered mental status, unable to void  EXAM: CT ABDOMEN AND PELVIS WITHOUT CONTRAST  TECHNIQUE: Multidetector CT imaging of the abdomen and pelvis was performed following the standard protocol without IV contrast.  COMPARISON:  01/28/2010  FINDINGS: Dependent bibasilar atelectasis noted.  Unenhanced liver, adrenal glands, spleen, and pancreas are unremarkable. Motion artifact degrades imaging. Exophytic right lower renal pole 0.8 cm probable hyperdense cyst is identified, unchanged from the prior exam. Sub cm low-density bilateral renal cortical lesions elsewhere are reidentified but not further characterized on the current noncontrast exam. No free fluid or air. No lymphadenopathy.  Normal appendix, image 59. No bowel wall thickening or focal segmental dilatation is identified. Bladder is decompressed but unremarkable. Uterus not visualized. Ovaries are normal. Moderate atheromatous aortic calcification noted without aneurysm. Multilevel disc degenerative change noted in the spine.  IMPRESSION: No acute intra-abdominal or pelvic pathology.   Electronically Signed   By: Christiana Pellant M.D.   On: 08/26/2014 23:47   Ct Head Wo Contrast  08/26/2014   CLINICAL DATA:  Acute onset of confusion. Patient has not voided today. Initial encounter.  EXAM: CT HEAD WITHOUT CONTRAST  TECHNIQUE: Contiguous axial images were obtained from the base of the skull through the vertex without intravenous contrast.  COMPARISON:  CT of the head performed 01/17/2010  FINDINGS: There is no evidence of acute infarction, mass lesion, or intra- or extra-axial hemorrhage on CT.  Prominence of the ventricles and sulci reflects mild to moderate cortical volume loss. Cerebellar atrophy is noted. Mild periventricular and subcortical white matter change likely reflects small vessel ischemic  microangiopathy.  The brainstem and fourth ventricle are within normal limits. The basal ganglia are unremarkable in appearance. The cerebral hemispheres demonstrate grossly normal gray-white differentiation. No mass effect or midline shift is seen.  There is no evidence of fracture; visualized osseous structures are unremarkable in appearance. The orbits are within normal limits. Dense inspissated mucus is noted within the right maxillary sinus. The remaining paranasal sinuses and mastoid air cells are well-aerated. No significant soft tissue abnormalities are seen.  IMPRESSION: 1. No acute intracranial pathology seen on CT. 2. Mild to moderate cortical volume loss and scattered small vessel ischemic microangiopathy. 3.  Dense inspissated mucus noted within the right maxillary sinus.   Electronically Signed   By: Roanna RaiderJeffery  Chang M.D.   On: 08/26/2014 23:46   [place]as Renal  08/27/2014   CLINICAL DATA:  Subsequent encounter for acute on chronic renal failure  EXAM: RENAL/URINARY TRACT ULTRASOUND COMPLETE  COMPARISON:  CT scan from 08/26/2014.  FINDINGS: Right Kidney:  Length: 10.6 cm. Poor acoustic window limited assessment. The low-density lesion seen on the CT scan yesterday could not be visualized by ultrasound today.  Left Kidney:  Length: 9.8 cm. Echogenicity within normal limits. No mass or hydronephrosis visualized.  Bladder:  Appears normal for degree of bladder distention.  IMPRESSION: Limited study secondary to poor acoustic window bilaterally. No hydronephrosis. Right renal cyst seen on CT scan yesterday not evident on ultrasound today.   Electronically Signed   By: Kennith CenterEric  Mansell M.D.   On: 08/27/2014 10:15   Dg Chest Port 1 View  08/31/2014   CLINICAL DATA:  Leukocytosis, diabetes, hypertension, kidney disease, altered mental status  EXAM: PORTABLE CHEST - 1 VIEW  COMPARISON:  Portable exam 125 hr compared to 08/26/2014  FINDINGS: Rotated to the LEFT.  Normal heart size and mediastinal contours.   Pulmonary vascular congestion.  Atherosclerotic calcification aorta.  Scattered interstitial infiltrates diffusely new since previous exam favor pulmonary edema though infection not completely excluded.  No gross pleural effusion or pneumothorax.  Bones demineralized.  Underlying COPD.  IMPRESSION: Diffuse interstitial infiltrates new since previous exam favor pulmonary edema over infection.   Electronically Signed   By: Ulyses SouthwardMark  Boles M.D.   On: 08/31/2014 11:52   Dg Chest Portable 1 View  08/26/2014   CLINICAL DATA:  Altered mental status, diabetes  EXAM: PORTABLE CHEST - 1 VIEW  COMPARISON:  None  FINDINGS: Heart size is within normal limits. The aorta is unfolded and ectatic. No focal pulmonary opacity. Evidence of arose origin or remote resection of the distal right clavicle. No acute osseous abnormality. No pleural effusion.  IMPRESSION: No acute cardiopulmonary process.   Electronically Signed   By: Christiana PellantGretchen  Green M.D.   On: 08/26/2014 22:37      Microbiology: Recent Results (from the past 240 hour(s))  MRSA PCR Screening     Status: None   Collection Time: 08/27/14  1:56 AM  Result Value Ref Range Status   MRSA by PCR NEGATIVE NEGATIVE Final    Comment:        The GeneXpert MRSA Assay (FDA approved for NASAL specimens only), is one component of a comprehensive MRSA colonization surveillance program. It is not intended to diagnose MRSA infection nor to guide or monitor treatment for MRSA infections.   Culture, blood (routine x 2)     Status: None   Collection Time: 08/27/14 10:50 AM  Result Value Ref Range Status   Specimen Description BLOOD LEFT ARM  Final   Special Requests BOTTLES DRAWN AEROBIC AND ANAEROBIC [facility]CC  Final   Culture  Setup Time   Final    08/27/2014 13:22 Performed at [facility]    Culture   Final    NO GROWTH 5 DAYS Performed at [facility]    Report Status 09/02/2014 FINAL  Final  Culture, blood (routine x 2)     Status: None    Collection Time: 08/27/14 10:55 AM  Result Value Ref Range Status   Specimen Description BLOOD RIGHT ARM  Final   Special Requests BOTTLES DRAWN AEROBIC ONLY 5CC  Final   Culture  Setup  Time   Final    08/27/2014 13:22 Performed at [facility]    Culture   Final    NO GROWTH 5 DAYS Performed at [facility]    Report Status 09/02/2014 FINAL  Final  Clostridium Difficile by PCR     Status: None   Collection Time: 08/30/14 10:17 AM  Result Value Ref Range Status   C difficile by pcr NEGATIVE NEGATIVE Final    Comment: Performed at [facility]     Labs: Results for orders placed or performed during the hospital encounter of 08/26/14 (from the past 48 hour(s))  Glucose, capillary     Status: Abnormal   Collection Time: 09/02/14 12:01 PM  Result Value Ref Range   Glucose-Capillary 119 (H) 70 - 99 mg/dL  Glucose, capillary     Status: Abnormal   Collection Time: 09/02/14  4:57 PM  Result Value Ref Range   Glucose-Capillary 156 (H) 70 - 99 mg/dL   Comment 1 Documented in Chart    Comment 2 Notify RN   Glucose, capillary     Status: Abnormal   Collection Time: 09/02/14  8:20 PM  Result Value Ref Range   Glucose-Capillary 143 (H) 70 - 99 mg/dL   Comment 1 Documented in Chart    Comment 2 Notify RN   Glucose, capillary     Status: Abnormal   Collection Time: 09/03/14 12:23 AM  Result Value Ref Range   Glucose-Capillary 102 (H) 70 - 99 mg/dL   Comment 1 Documented in Chart    Comment 2 Notify RN   Glucose, capillary     Status: Abnormal   Collection Time: 09/03/14  4:47 AM  Result Value Ref Range   Glucose-Capillary 131 (H) 70 - 99 mg/dL  Glucose, capillary     Status: Abnormal   Collection Time: 09/03/14  7:37 AM  Result Value Ref Range   Glucose-Capillary 104 (H) 70 - 99 mg/dL  Glucose, capillary     Status: Abnormal   Collection Time: 09/03/14 12:19 PM  Result Value Ref Range   Glucose-Capillary 120 (H) 70 - 99 mg/dL  Glucose, capillary      Status: Abnormal   Collection Time: 09/03/14  4:36 PM  Result Value Ref Range   Glucose-Capillary 120 (H) 70 - 99 mg/dL   Comment 1 Documented in Chart    Comment 2 Notify RN   Glucose, capillary     Status: Abnormal   Collection Time: 09/03/14  8:14 PM  Result Value Ref Range   Glucose-Capillary 130 (H) 70 - 99 mg/dL  Glucose, capillary     Status: Abnormal   Collection Time: 09/03/14 11:50 PM  Result Value Ref Range   Glucose-Capillary 109 (H) 70 - 99 mg/dL   Comment 1 Notify RN    Comment 2 Documented in Chart   Glucose, capillary     Status: Abnormal   Collection Time: 09/04/14  4:04 AM  Result Value Ref Range   Glucose-Capillary 119 (H) 70 - 99 mg/dL  Glucose, capillary     Status: None   Collection Time: 09/04/14  7:33 AM  Result Value Ref Range   Glucose-Capillary 92 70 - 99 mg/dL     HPI :Brief history  80 year old female with a history of diabetes mellitus, CKD stage II-III, hypertension, hypothyroidism presented from [facility] with altered mental status. Unfortunately, the patient is unable to provide any significant history. I spoke with the patient's son, [name]. He revealed to me that  the patient has a long history of alcohol dependence. More recently, the patient has been drinking one to 2 glasses of wine daily. At her assisted living facility, they are allowed to keep her own alcohol in the room. At baseline, the patient is able to make transfers from the bed to her wheelchair with which she gets around. Unfortunately, the patient has much difficulty ambulating at baseline. The patient was noted to have acute kidney injury with elevated serum creatinine and elevated point-of-care troponin in the emergency department. In addition, fecal occult blood test was also positive. The patient was also noted to have relative hypotension with systolic blood pressures in the mid 90s. Notably, the patient was admitted in January 2015 with significant anemia with hemoglobin of 4.8.  She has not had any endoscopy or GI follow-up. During her January admission, the patient was given 3 units PRBC.  HOSPITAL COURSE:  Acute encephalopathy, most likely secondary to alcohol withdrawal, narcotic withdrawal, patient tachycardic, hypertensive-confusion is waxing and waning  -Multifactorial including anemia, acute kidney injury, dehydration and possibly alcohol withdrawal and Wernicke's encephalopathy ammonia level was 50, vitamin B-12 1213 -TSH--0.123 , will recheck ammonia level again tomorrow Continue thiamine supplementation -CIWA scale -use diazepam instead, lorazepam discontinued as not effective  CT head negative Speech therapy recommends regular diet with thin liquids, PT/OT eval-recommended SNF Repeat ABG , repeat ammonia level was 26 Started on clonidine protocol for suspected narcotic withdrawal, clonidine now discontinued Patient started on a low-dose fentanyl patch because of complains about significant pain, without any signs of oversedation, unfortunately patient still continues to be agitated Discontinue safety sitter 12/27 pschychiatry eval requested and they recommend discontinuing Valium and switching to Ativan as needed. Restarting patient on Depakote Discontinued venlafaxine     Anemia with positive FOBT/melena -FOBT was positive in the emergency department Colonoscopy showed ischemic colitis Hemoglobin stable, repeat in 2-3 days -08/27/2014 EGD--negative hold ASA for now, avoid NSAIDs -continue protonix , GI has signed off   Mild Leukocytosis In the setting of agitation, concerned about aspiration pneumonia Chest x-ray shows diffuse interstitial infiltrate, unlikely pneumonia Blood culture no growth so far   Alcohol dependence /narcotic dependence Extensive discussion with the son, concern about overuse of narcotics along with alcohol Patient may be in mild withdrawal from alcohol as well as narcotics, We will minimize narcotics in the  hospital, started opioid withdrawal protocol Discontinued clonidine protocol prior to discharge Restarted low-dose fentanyl patch Avoid by mouth narcotics   Sinus tachycardia-resolved -may be rebound from being off diltiazem vs Etoh/opioid withdraw -TSH 0.123--although a little suppressed, suspect sick euthyroid syndrome Status post IV fluid hydration Continue diltiazem   Acute on chronic renal failure (CKD2-3) -baseline creatinine 0.9-1.2 -renal us--neg for hydronephrosis Discontinue IV fluids because of pulmonary congestion, will administer one dose of IV Lasix Renal function improving, restart lisinopril if creatinine stable   Elevated point-of-care troponin/abnormal EKG -Cycle troponins--neg x 3 -no chest pain presently  Diabetes mellitus type 2 -Hemoglobin A1c--5.8 Resume low-dose Levemir Continue Accu-Cheks 3 times a day before meals and decrease dose if CBGs less than 90  Hypothyroidism  -TSH is 0.123 , free T4 is normal -Continue Synthroid   Relative hypotension  Completed IV fluid hydration Restart antihypertensive medications   Hypokalemia Replete potassium Magnesium 1.5, repleted Recheck   Discharge Exam:  Blood pressure 153/68, pulse 87, temperature 98.4 F (36.9 C), temperature source Oral, resp. rate 18, height 5\' 1"  (1.549 m), weight 60 kg (132 lb 4.4 oz), SpO2 100 %.  General: Agitated confused and combative  HEENT: No icterus, No thrush, No meningismus, [place]/AT  Cardiovascular: RRR, S1/S2, no rubs, no gallops  Respiratory: poor inspiratory effort but clear to auscultation   Abdomen: Soft/+BS, non tender, non distended, no guarding  Extremities: No edema, No lymphangitis, No petechiae, No rashes, no synovitis       Discharge Instructions    Diet - low sodium heart healthy    Complete by:  As directed      Increase activity slowly    Complete by:  As directed            Follow-up Information    Follow up with  Brooke BonitoGALLEMORE,WARREN, MD. Schedule an appointment as soon as possible for a visit in 1 week.   Specialty:  Internal Medicine   Contact information:   [address] [place] [address] [phone]       Signed: Richarda OverlieBROL,Louna Rothgeb 09/04/2014, 11:20 AM

## 2014-09-04 NOTE — Progress Notes (Signed)
Patient is set to discharge to [facility] today. Patient & daughter-in-law, [name] (ph#: [phone]) aware. Discharge packet given to RN, Victorino DikeJennifer. [facility] called for transport to pickup at 4:45pm.   Clinical Social Work Department CLINICAL SOCIAL WORK PLACEMENT NOTE 09/04/2014  Patient:  Kaitlyn GheeJONES,[name]  Account Number:  [id number] Admit date:  08/26/2014  Clinical Social Worker:  Orpah GreekKELLY FOLEY, LCSWA  Date/time:  09/04/2014 12:02 PM  Clinical Social Work is seeking post-discharge placement for this patient at the following level of care:   SKILLED NURSING   (*CSW will update this form in Epic as items are completed)   09/03/2014  Patient/family provided with [facility] Department of Clinical Social Work's list of facilities offering this level of care within the geographic area requested by the patient (or if unable, by the patient's family).  09/03/2014  Patient/family informed of their freedom to choose among providers that offer the needed level of care, that participate in Medicare, Medicaid or managed care program needed by the patient, have an available bed and are willing to accept the patient.  09/03/2014  Patient/family informed of [facility]' ownership interest in [facility], as well as of the fact that they are under no obligation to receive care at this facility.  PASARR submitted to EDS on 09/03/2014 PASARR number received on 09/03/2014  FL2 transmitted to all facilities in geographic area requested by pt/family on  09/03/2014 FL2 transmitted to all facilities within larger geographic area on   Patient informed that his/her managed care company has contracts with or will negotiate with  certain facilities, including the following:     Patient/family informed of bed offers received:  09/03/2014 [facility] Physician recommends and patient chooses bed at    [facility]  on  09/04/2014 Patient to be transferred to facility by [facility] Patient and family notified of transfer on 09/04/2014 Name of family member notified:  patient's daughter-in-law, Kriste BasqueBecky via phone  The following physician request were entered in Epic:   Additional Comments:   Lincoln MaxinKelly Shala Baumbach, LCSW [facility] Clinical Social Worker cell #: [phone]

## 2014-09-11 ENCOUNTER — Encounter (HOSPITAL_COMMUNITY): Payer: Self-pay

## 2014-09-11 ENCOUNTER — Inpatient Hospital Stay (HOSPITAL_COMMUNITY)
Admission: EM | Admit: 2014-09-11 | Discharge: 2014-09-20 | DRG: 871 | Disposition: A | Discharge status: Skilled Nursing Facility | Payer: Medicare Other | Attending: Internal Medicine | Admitting: Internal Medicine

## 2014-09-11 ENCOUNTER — Emergency Department (HOSPITAL_COMMUNITY): Payer: Medicare Other

## 2014-09-11 DIAGNOSIS — N390 Urinary tract infection, site not specified: Secondary | ICD-10-CM | POA: Diagnosis present

## 2014-09-11 DIAGNOSIS — G8929 Other chronic pain: Secondary | ICD-10-CM | POA: Diagnosis present

## 2014-09-11 DIAGNOSIS — M1990 Unspecified osteoarthritis, unspecified site: Secondary | ICD-10-CM | POA: Diagnosis present

## 2014-09-11 DIAGNOSIS — N183 Chronic kidney disease, stage 3 (moderate): Secondary | ICD-10-CM | POA: Diagnosis present

## 2014-09-11 DIAGNOSIS — Z794 Long term (current) use of insulin: Secondary | ICD-10-CM

## 2014-09-11 DIAGNOSIS — E119 Type 2 diabetes mellitus without complications: Secondary | ICD-10-CM | POA: Diagnosis present

## 2014-09-11 DIAGNOSIS — Z888 Allergy status to other drugs, medicaments and biological substances status: Secondary | ICD-10-CM

## 2014-09-11 DIAGNOSIS — Z90710 Acquired absence of both cervix and uterus: Secondary | ICD-10-CM

## 2014-09-11 DIAGNOSIS — Z880 Allergy status to penicillin: Secondary | ICD-10-CM

## 2014-09-11 DIAGNOSIS — Z91048 Other nonmedicinal substance allergy status: Secondary | ICD-10-CM

## 2014-09-11 DIAGNOSIS — K1121 Acute sialoadenitis: Secondary | ICD-10-CM | POA: Diagnosis present

## 2014-09-11 DIAGNOSIS — I808 Phlebitis and thrombophlebitis of other sites: Secondary | ICD-10-CM | POA: Diagnosis not present

## 2014-09-11 DIAGNOSIS — Z96659 Presence of unspecified artificial knee joint: Secondary | ICD-10-CM | POA: Diagnosis present

## 2014-09-11 DIAGNOSIS — R5383 Other fatigue: Secondary | ICD-10-CM

## 2014-09-11 DIAGNOSIS — I10 Essential (primary) hypertension: Secondary | ICD-10-CM | POA: Diagnosis present

## 2014-09-11 DIAGNOSIS — E039 Hypothyroidism, unspecified: Secondary | ICD-10-CM | POA: Diagnosis present

## 2014-09-11 DIAGNOSIS — K1120 Sialoadenitis, unspecified: Secondary | ICD-10-CM | POA: Diagnosis present

## 2014-09-11 DIAGNOSIS — G9340 Encephalopathy, unspecified: Secondary | ICD-10-CM | POA: Diagnosis not present

## 2014-09-11 DIAGNOSIS — A419 Sepsis, unspecified organism: Secondary | ICD-10-CM | POA: Diagnosis present

## 2014-09-11 DIAGNOSIS — Z79899 Other long term (current) drug therapy: Secondary | ICD-10-CM

## 2014-09-11 DIAGNOSIS — B9620 Unspecified Escherichia coli [E. coli] as the cause of diseases classified elsewhere: Secondary | ICD-10-CM | POA: Diagnosis present

## 2014-09-11 DIAGNOSIS — I248 Other forms of acute ischemic heart disease: Secondary | ICD-10-CM | POA: Diagnosis present

## 2014-09-11 DIAGNOSIS — Z881 Allergy status to other antibiotic agents status: Secondary | ICD-10-CM

## 2014-09-11 DIAGNOSIS — N179 Acute kidney failure, unspecified: Secondary | ICD-10-CM | POA: Diagnosis present

## 2014-09-11 DIAGNOSIS — R778 Other specified abnormalities of plasma proteins: Secondary | ICD-10-CM

## 2014-09-11 DIAGNOSIS — E876 Hypokalemia: Secondary | ICD-10-CM | POA: Diagnosis not present

## 2014-09-11 DIAGNOSIS — Z66 Do not resuscitate: Secondary | ICD-10-CM | POA: Diagnosis present

## 2014-09-11 DIAGNOSIS — L539 Erythematous condition, unspecified: Secondary | ICD-10-CM

## 2014-09-11 DIAGNOSIS — R7989 Other specified abnormal findings of blood chemistry: Secondary | ICD-10-CM

## 2014-09-11 DIAGNOSIS — K219 Gastro-esophageal reflux disease without esophagitis: Secondary | ICD-10-CM | POA: Diagnosis present

## 2014-09-11 DIAGNOSIS — M1A9XX0 Chronic gout, unspecified, without tophus (tophi): Secondary | ICD-10-CM | POA: Diagnosis present

## 2014-09-11 DIAGNOSIS — F1010 Alcohol abuse, uncomplicated: Secondary | ICD-10-CM | POA: Diagnosis present

## 2014-09-11 DIAGNOSIS — M109 Gout, unspecified: Secondary | ICD-10-CM | POA: Diagnosis present

## 2014-09-11 DIAGNOSIS — K5900 Constipation, unspecified: Secondary | ICD-10-CM | POA: Diagnosis not present

## 2014-09-11 DIAGNOSIS — F0391 Unspecified dementia with behavioral disturbance: Secondary | ICD-10-CM | POA: Diagnosis present

## 2014-09-11 DIAGNOSIS — I129 Hypertensive chronic kidney disease with stage 1 through stage 4 chronic kidney disease, or unspecified chronic kidney disease: Secondary | ICD-10-CM | POA: Diagnosis present

## 2014-09-11 HISTORY — DX: Urinary tract infection, site not specified: N39.0

## 2014-09-11 LAB — COMPREHENSIVE METABOLIC PANEL
ALT: 31 U/L (ref 0–35)
AST: 29 U/L (ref 0–37)
Albumin: 2.4 g/dL — ABNORMAL LOW (ref 3.5–5.2)
Alkaline Phosphatase: 153 U/L — ABNORMAL HIGH (ref 39–117)
Anion gap: 12 (ref 5–15)
BUN: 21 mg/dL (ref 6–23)
CALCIUM: 8.5 mg/dL (ref 8.4–10.5)
CO2: 30 mmol/L (ref 19–32)
Chloride: 99 mEq/L (ref 96–112)
Creatinine, Ser: 0.75 mg/dL (ref 0.50–1.10)
GFR calc Af Amer: 86 mL/min — ABNORMAL LOW (ref >90)
GFR calc non Af Amer: 74 mL/min — ABNORMAL LOW (ref >90)
Glucose, Bld: 105 mg/dL — ABNORMAL HIGH (ref 70–99)
Potassium: 3.7 mmol/L (ref 3.5–5.1)
Sodium: 141 mmol/L (ref 135–145)
TOTAL PROTEIN: 5.9 g/dL — AB (ref 6.0–8.3)
Total Bilirubin: 0.4 mg/dL (ref 0.3–1.2)

## 2014-09-11 LAB — URINE MICROSCOPIC-ADD ON

## 2014-09-11 LAB — CBC WITH DIFFERENTIAL/PLATELET
BASOS ABS: 0 10*3/uL (ref 0.0–0.1)
Basophils Relative: 0 % (ref 0–1)
EOS ABS: 0 10*3/uL (ref 0.0–0.7)
Eosinophils Relative: 0 % (ref 0–5)
HCT: 37.9 % (ref 36.0–46.0)
HEMOGLOBIN: 12.1 g/dL (ref 12.0–15.0)
Lymphocytes Relative: 6 % — ABNORMAL LOW (ref 12–46)
Lymphs Abs: 1.6 10*3/uL (ref 0.7–4.0)
MCH: 32.6 pg (ref 26.0–34.0)
MCHC: 31.9 g/dL (ref 30.0–36.0)
MCV: 102.2 fL — ABNORMAL HIGH (ref 78.0–100.0)
Monocytes Absolute: 2.4 10*3/uL — ABNORMAL HIGH (ref 0.1–1.0)
Monocytes Relative: 9 % (ref 3–12)
Neutro Abs: 22.8 10*3/uL — ABNORMAL HIGH (ref 1.7–7.7)
Neutrophils Relative %: 85 % — ABNORMAL HIGH (ref 43–77)
PLATELETS: 263 10*3/uL (ref 150–400)
RBC: 3.71 MIL/uL — AB (ref 3.87–5.11)
RDW: 15 % (ref 11.5–15.5)
WBC: 26.8 10*3/uL — ABNORMAL HIGH (ref 4.0–10.5)

## 2014-09-11 LAB — URINALYSIS, ROUTINE W REFLEX MICROSCOPIC
Bilirubin Urine: NEGATIVE
GLUCOSE, UA: NEGATIVE mg/dL
Ketones, ur: NEGATIVE mg/dL
NITRITE: POSITIVE — AB
PH: 6.5 (ref 5.0–8.0)
Protein, ur: 100 mg/dL — AB
Specific Gravity, Urine: 1.018 (ref 1.005–1.030)
Urobilinogen, UA: 0.2 mg/dL (ref 0.0–1.0)

## 2014-09-11 LAB — RAPID URINE DRUG SCREEN, HOSP PERFORMED
Amphetamines: NOT DETECTED
Barbiturates: NOT DETECTED
Benzodiazepines: POSITIVE — AB
COCAINE: NOT DETECTED
Opiates: NOT DETECTED
Tetrahydrocannabinol: NOT DETECTED

## 2014-09-11 LAB — GLUCOSE, CAPILLARY: GLUCOSE-CAPILLARY: 110 mg/dL — AB (ref 70–99)

## 2014-09-11 LAB — ETHANOL: Alcohol, Ethyl (B): <5 mg/dL (ref 0–9)

## 2014-09-11 LAB — TROPONIN I: TROPONIN I: 0.04 ng/mL — AB (ref <0.031)

## 2014-09-11 MED ORDER — VANCOMYCIN HCL IN DEXTROSE 1-5 GM/200ML-% IV SOLN
1000.0000 mg | INTRAVENOUS | Status: AC
  Administered 2014-09-11: 1000 mg via INTRAVENOUS
  Filled 2014-09-11: qty 200

## 2014-09-11 MED ORDER — ENOXAPARIN SODIUM 40 MG/0.4ML ~~LOC~~ SOLN
40.0000 mg | SUBCUTANEOUS | Status: DC
  Administered 2014-09-11 – 2014-09-19 (×9): 40 mg via SUBCUTANEOUS
  Filled 2014-09-11 (×10): qty 0.4

## 2014-09-11 MED ORDER — FERROUS SULFATE 325 (65 FE) MG PO TABS
325.0000 mg | ORAL_TABLET | Freq: Three times a day (TID) | ORAL | Status: DC
  Administered 2014-09-17 – 2014-09-19 (×7): 325 mg via ORAL
  Filled 2014-09-11 (×29): qty 1

## 2014-09-11 MED ORDER — ONDANSETRON HCL 4 MG/2ML IJ SOLN
4.0000 mg | Freq: Four times a day (QID) | INTRAMUSCULAR | Status: DC | PRN
  Administered 2014-09-11: 4 mg via INTRAVENOUS
  Filled 2014-09-11: qty 2

## 2014-09-11 MED ORDER — DIVALPROEX SODIUM ER 500 MG PO TB24
500.0000 mg | ORAL_TABLET | Freq: Every day | ORAL | Status: DC
  Filled 2014-09-11 (×4): qty 1

## 2014-09-11 MED ORDER — TRAZODONE HCL 50 MG PO TABS
50.0000 mg | ORAL_TABLET | Freq: Three times a day (TID) | ORAL | Status: DC | PRN
Start: 2014-09-11 — End: 2014-09-14

## 2014-09-11 MED ORDER — VANCOMYCIN HCL 500 MG IV SOLR
500.0000 mg | Freq: Two times a day (BID) | INTRAVENOUS | Status: DC
  Filled 2014-09-11: qty 500

## 2014-09-11 MED ORDER — SODIUM CHLORIDE 0.9 % IV SOLN
INTRAVENOUS | Status: DC
  Administered 2014-09-11: 75 mL/h via INTRAVENOUS
  Administered 2014-09-12: 07:00:00 via INTRAVENOUS

## 2014-09-11 MED ORDER — ONDANSETRON HCL 4 MG PO TABS: 4.0000 mg | ORAL_TABLET | Freq: Four times a day (QID) | ORAL | Status: DC | PRN

## 2014-09-11 MED ORDER — LATANOPROST 0.005 % OP SOLN
1.0000 [drp] | Freq: Every day | OPHTHALMIC | Status: DC
  Administered 2014-09-11 – 2014-09-19 (×9): 1 [drp] via OPHTHALMIC
  Filled 2014-09-11: qty 2.5

## 2014-09-11 MED ORDER — HYDROCORTISONE ACETATE 25 MG RE SUPP
25.0000 mg | Freq: Two times a day (BID) | RECTAL | Status: DC | PRN
  Administered 2014-09-17: 25 mg via RECTAL
  Filled 2014-09-11 (×2): qty 1

## 2014-09-11 MED ORDER — DIVALPROEX SODIUM ER 250 MG PO TB24
250.0000 mg | ORAL_TABLET | Freq: Every day | ORAL | Status: DC
  Filled 2014-09-11 (×3): qty 1

## 2014-09-11 MED ORDER — DILTIAZEM HCL ER 240 MG PO CP24
240.0000 mg | ORAL_CAPSULE | Freq: Every day | ORAL | Status: DC
  Administered 2014-09-17 – 2014-09-20 (×3): 240 mg via ORAL
  Filled 2014-09-11 (×10): qty 1

## 2014-09-11 MED ORDER — PSYLLIUM 0.52 G PO CAPS: 0.5200 g | ORAL_CAPSULE | Freq: Every day | ORAL | Status: DC

## 2014-09-11 MED ORDER — DEXTROSE 5 % IV SOLN
1.0000 g | Freq: Three times a day (TID) | INTRAVENOUS | Status: DC
  Administered 2014-09-11 – 2014-09-19 (×23): 1 g via INTRAVENOUS
  Filled 2014-09-11 (×24): qty 1

## 2014-09-11 MED ORDER — DEXTROSE 5 % IV SOLN
1.0000 g | INTRAVENOUS | Status: AC
  Administered 2014-09-11: 1 g via INTRAVENOUS
  Filled 2014-09-11: qty 1

## 2014-09-11 MED ORDER — LEVOTHYROXINE SODIUM 75 MCG PO TABS
75.0000 ug | ORAL_TABLET | Freq: Every day | ORAL | Status: DC
Start: 2014-09-12 — End: 2014-09-14
  Filled 2014-09-11 (×4): qty 1

## 2014-09-11 MED ORDER — MORPHINE SULFATE 2 MG/ML IJ SOLN
1.0000 mg | INTRAMUSCULAR | Status: DC | PRN
  Administered 2014-09-11 – 2014-09-14 (×12): 1 mg via INTRAVENOUS
  Filled 2014-09-11 (×12): qty 1

## 2014-09-11 MED ORDER — MAGNESIUM OXIDE 400 (241.3 MG) MG PO TABS
400.0000 mg | ORAL_TABLET | Freq: Two times a day (BID) | ORAL | Status: DC
  Filled 2014-09-11 (×7): qty 1

## 2014-09-11 MED ORDER — PSYLLIUM 95 % PO PACK
1.0000 | PACK | Freq: Every day | ORAL | Status: DC
  Administered 2014-09-16: 1 via ORAL
  Filled 2014-09-11 (×10): qty 1

## 2014-09-11 MED ORDER — ALLOPURINOL 100 MG PO TABS
100.0000 mg | ORAL_TABLET | Freq: Two times a day (BID) | ORAL | Status: DC
  Filled 2014-09-11 (×7): qty 1

## 2014-09-11 MED ORDER — PANTOPRAZOLE SODIUM 40 MG PO TBEC
40.0000 mg | DELAYED_RELEASE_TABLET | Freq: Every day | ORAL | Status: DC
  Filled 2014-09-11 (×3): qty 1

## 2014-09-11 MED ORDER — IOHEXOL 300 MG/ML  SOLN
100.0000 mL | Freq: Once | INTRAMUSCULAR | Status: AC | PRN
  Administered 2014-09-11: 100 mL via INTRAVENOUS

## 2014-09-11 MED ORDER — INSULIN GLARGINE 100 UNIT/ML ~~LOC~~ SOLN
8.0000 [IU] | Freq: Every day | SUBCUTANEOUS | Status: DC
  Administered 2014-09-11: 8 [IU] via SUBCUTANEOUS
  Filled 2014-09-11 (×2): qty 0.08

## 2014-09-11 NOTE — Progress Notes (Signed)
[facility] ED CM consulted by admission RN, Delaney Meigsamara, about pt code status discrepancy [facility].  Facility form states pt is "full code" per L Mcgram or L Mc Graw LPN. ED CM called [facility] and spoke with Bethy x 3 who attempted to get cm connected with pt RN but [facility].  Bethy confirmed with CM that pt is listed as DNR NOT Full code per [facility].  CM corrected this on [facility] form, updated ED RN & Delaney Meigsamara, Admission RN and paged attending MD, Izola PriceMyers with correction

## 2014-09-11 NOTE — Progress Notes (Signed)
  CARE MANAGEMENT ED NOTE 09/11/2014  Patient:  Kaitlyn Burns,Kaitlyn Burns   Account Number:  [id number]  Date Initiated:  09/11/2014  Documentation initiated by:  Edd ArbourGIBBS,Ernesteen Mihalic  Subjective/Objective Assessment:   81 yr [facility] medicare [facility]. [facility] Per staff Pt c/o of swelling to bil neck     Subjective/Objective Assessment Detail:   ppc Hal Stoneking  dx with parotitits per Dr Izola PriceMyers     Action/Plan:   Action/Plan Detail:   EPIC updated wtih Dr Merlene LaughterHal Stoneking as pcp   Anticipated DC Date:       Status Recommendation to Physician:   Result of Recommendation:    Other ED Services  Consult Working Plan    DC Planning Services  Other  Outpatient Services - Pt will follow up  PCP issues    Choice offered to / List presented to:            Status of service:  Completed, signed off  ED Comments:   ED Comments Detail:  [facility] ED CM consulted by admission RN, Delaney Meigsamara, about pt code status [facility].  Facility form states pt is "full code" per L Mcgram or L Mc Graw LPN. ED CM called [facility] and spoke with Bethy x 3 who attempted to get cm connected with pt RN but [facility].  Bethy confirmed with CM that pt is listed as DNR NOT [facility] code [facility].  CM corrected this on [facility] form, updated ED RN & Delaney Meigsamara, Admission RN and paged attending MD, Izola PriceMyers with correction

## 2014-09-11 NOTE — ED Provider Notes (Signed)
CSN: [id number]     Arrival date & time 09/11/14  [id number] History   First MD Initiated Contact with Patient 09/11/14 [phone]     Chief Complaint  Patient presents with  . Neck Pain  . Urinary Tract Infection   (Consider location/radiation/quality/duration/timing/severity/associated sxs/prior Treatment) Level 5 Caveat HPI Kaitlyn Burns is a 81 yo female presenting from the nursing facility with report of bilat neck swelling.  [facility] where was admitted for acute encephalopathy and hypotension.  Nursing staff report she has been seen for "recurrent neck bursitis".  EMS states this swelling began yesterday.  Staff also reports strong smell to urine.  Spoke to son via phone who state the pt had been alert enough to participate in rehab a few days earlier but he noticed some mild redness to her jaw the day she was discharged and an xray was done and negative for fracture.     Past Medical History  Diagnosis Date  . Diabetes mellitus   . Thyroid disease   . Complication of anesthesia     trouble intubating surgery  . Difficult intubation   . Arthritis   . UTI (lower urinary tract infection)    Past Surgical History  Procedure Laterality Date  . Abdominal hysterectomy    . Hemorroidectomy    . Medial partial knee replacement    . Thyroidectomy, partial    . Shoulder surgery Right     "had shredded a muscle"  . Achilles tendon surgery Left   . Foot surgery Left     left heel infection that went to bone per spouse  . Lower abdominel surgery    . Esophagogastroduodenoscopy (egd) with propofol N/A 08/28/2014    Procedure: ESOPHAGOGASTRODUODENOSCOPY (EGD) WITH PROPOFOL;  Surgeon: Louis Meckelobert D Kaplan, MD;  Location: [facility] ENDOSCOPY;  Service: Endoscopy;  Laterality: N/A;  . Colonoscopy N/A 08/29/2014    Procedure: COLONOSCOPY;  Surgeon: Louis Meckelobert D Kaplan, MD;  Location: [facility] ENDOSCOPY;  Service: Endoscopy;  Laterality: N/A;   No family history on file. History  Substance  Use Topics  . Smoking status: Never Smoker   . Smokeless tobacco: Never Used  . Alcohol Use: Yes     Comment: red wine with evening meal   OB History    No data available     Review of Systems  Unable to perform ROS: Dementia      Allergies  Gabapentin; Lorazepam; Metoclopramide; Penicillins; Tape; and Teflaro  Home Medications   Prior to Admission medications   Medication Sig Start Date End Date Taking? Authorizing Provider  allopurinol (ZYLOPRIM) 100 MG tablet Take 100 mg by mouth 2 (two) times daily.    Historical Provider, MD  Amino Acids-Protein Hydrolys (FEEDING SUPPLEMENT, PRO-STAT SUGAR FREE 64,) LIQD Take 30 mLs by mouth daily.    Historical Provider, MD  beta carotene w/minerals (OCUVITE) tablet Take 1 tablet by mouth 2 (two) times daily.    Historical Provider, MD  calcium-vitamin D (OSCAL WITH D) 500-200 MG-UNIT per tablet Take 1 tablet by mouth 2 (two) times daily.     Historical Provider, MD  cholecalciferol (VITAMIN D) 1000 UNITS tablet Take 1,000 Units by mouth daily.    Historical Provider, MD  diltiazem (DILACOR XR) 240 MG 24 hr capsule Take 240 mg by mouth daily.    Historical Provider, MD  divalproex (DEPAKOTE ER) 250 MG 24 hr tablet Take 250-500 mg by mouth 2 (two) times daily. 1 tablet in the am and 2  tablets in the pm    Historical Provider, MD  feeding supplement (ENSURE CLINICAL STRENGTH) LIQD Take 237 mLs by mouth daily.    Historical Provider, MD  fentaNYL (DURAGESIC - DOSED MCG/HR) 25 MCG/HR patch Place 1 patch (25 mcg total) onto the skin every 3 (three) days. 09/04/14   Richarda Overlie, MD  ferrous sulfate 325 (65 FE) MG tablet Take 1 tablet (325 mg total) by mouth 3 (three) times daily with meals. Patient taking differently: Take 325 mg by mouth daily with breakfast.  09/13/13   Penny Pia, MD  folic acid (FOLVITE) 1 MG tablet Take 1 tablet (1 mg total) by mouth daily. 09/04/14   Richarda Overlie, MD  hydrocortisone (ANUSOL-HC) 25 MG suppository Place 25 mg  rectally 2 (two) times daily as needed for hemorrhoids.  09/09/13   Historical Provider, MD  insulin glargine (LANTUS) 100 UNIT/ML injection Inject 8 Units into the skin at bedtime.    Historical Provider, MD  latanoprost (XALATAN) 0.005 % ophthalmic solution Place 1 drop into both eyes at bedtime.    Historical Provider, MD  levothyroxine (SYNTHROID, LEVOTHROID) 75 MCG tablet Take 75 mcg by mouth daily before breakfast.    Historical Provider, MD  LORazepam (ATIVAN) 0.5 MG tablet Take 1 tablet (0.5 mg total) by mouth every 6 (six) hours as needed for anxiety. 09/04/14   Richarda Overlie, MD  magnesium oxide (MAG-OX) 400 (241.3 MG) MG tablet Take 1 tablet (400 mg total) by mouth 2 (two) times daily. 09/04/14   Richarda Overlie, MD  omeprazole (PRILOSEC) 20 MG capsule Take 20 mg by mouth daily.    Historical Provider, MD  pantoprazole (PROTONIX) 40 MG tablet Take 1 tablet (40 mg total) by mouth daily. 09/04/14   Richarda Overlie, MD  psyllium (REGULOID) 0.52 G capsule Take 0.52 g by mouth at bedtime.    Historical Provider, MD  traMADol (ULTRAM) 50 MG tablet Take 1 tablet (50 mg total) by mouth every 6 (six) hours as needed. 09/04/14   Richarda Overlie, MD  traZODone (DESYREL) 50 MG tablet Take 1 tablet (50 mg total) by mouth 3 (three) times daily as needed for sleep. 09/03/14   Richarda Overlie, MD   BP 140/56 mmHg  Pulse 90  Temp(Src) 98.2 F (36.8 C) (Oral)  Resp 20  SpO2 94% Physical Exam  Constitutional: She appears cachectic. No distress.  Pt's eyes open spontaneously but does not follow commands and does answer questions.   HENT:  Head: Normocephalic and atraumatic.    Mouth/Throat: Oropharynx is clear and moist. Mucous membranes are dry. No oropharyngeal exudate.  Eyes: Conjunctivae are normal.  Neck: Neck supple. No thyromegaly present.  Cardiovascular: Normal rate, regular rhythm and intact distal pulses.   Pulmonary/Chest: Effort normal and breath sounds normal. No respiratory distress. She has no  wheezes. She has no rales. She exhibits no tenderness.  Abdominal: Soft. There is no tenderness.  Musculoskeletal: She exhibits no tenderness.  Lymphadenopathy:    She has no cervical adenopathy.  Neurological: She is alert. GCS eye subscore is 4. GCS verbal subscore is 2. GCS motor subscore is 5.  Skin: Skin is warm and dry. She is not diaphoretic.  Redness and small stage 2 pressure ulcer to sacrum  Psychiatric: She has a normal mood and affect.  Nursing note and vitals reviewed.   ED Course  Procedures (including critical care time) Labs Review Labs Reviewed  URINALYSIS, ROUTINE W REFLEX MICROSCOPIC - Abnormal; Notable for the following:    APPearance TURBID (*)  Hgb urine dipstick MODERATE (*)    Protein, ur 100 (*)    Nitrite POSITIVE (*)    Leukocytes, UA LARGE (*)    All other components within normal limits  URINE MICROSCOPIC-ADD ON  CBC WITH DIFFERENTIAL  COMPREHENSIVE METABOLIC PANEL  TROPONIN I  URINE RAPID DRUG SCREEN (HOSP PERFORMED)  ETHANOL    Imaging Review Ct Maxillofacial W/cm  09/11/2014   CLINICAL DATA:  Facial swelling  EXAM: CT MAXILLOFACIAL WITH CONTRAST  TECHNIQUE: Multidetector CT imaging of the maxillofacial structures was performed with intravenous contrast. Multiplanar CT image reconstructions were also generated. A small metallic BB was placed on the right temple in order to reliably differentiate right from left.  CONTRAST:  OMNIPAQUE IOHEXOL 300 MG/ML  SOLN  COMPARISON:  None.  FINDINGS: Right carotid is enlarged and shows diffuse hyper enhancement consistent with parotitis. No underlying mass or abscess. Left parotid gland shows mild enhancement, with overlying skin edema.  Submandibular gland normal in size and enhancement bilaterally.  Tongue and pharynx normal.  Visualized intracranial contents show no acute abnormality.  Chronic sinusitis right maxillary sinus which is nearly completely opacified with diffuse bony thickening. No acute bony  change. Advanced degenerative change in the TMJ bilaterally.  IMPRESSION: Acute parotitis right greater than left without abscess or mass.  Chronic sinusitis right maxillary sinus pain   Electronically Signed   By: Marlan Palau M.D.   On: 09/11/2014 11:52     EKG Interpretation   Date/Time:  Tuesday September 11 2014 10:03:23 EST Ventricular Rate:  95 PR Interval:  158 QRS Duration: 72 QT Interval:  428 QTC Calculation: 538 R Axis:   66 Text Interpretation:  Sinus rhythm Repol abnrm suggests ischemia,  anterolateral Prolonged QT interval ED PHYSICIAN INTERPRETATION AVAILABLE  IN [facility] Confirmed by TEST, Record ([id number]) on 09/13/2014 7:05:05  AM      MDM   Final diagnoses:  Erythema  Parotitis, acute  UTI (lower urinary tract infection)   81 yo from nursing facility presenting with bilat facial redness and swelling.  Pt has history of dementia and does not answer any questions on exam.  Discussed case with Dr. Rubin Payor.    CBC, CMP, Troponin, UA, UDS, ethanol and CT maxillofacial.    UA shows large leukocytes, positive nitraites and TNTC WBC and CT shows erythema due to bilat parotitis.  Pt has multiple abx allergies listed.  Spoke with her son and daughter in law on the phone regarding allergies who report a known allergy to Cipro which causes restlessness and agitation.  Vanc IV per pharmacy ordered  1:15PM: Consulted Dr. Izola Price (hospitalist) regarding pt, who will accepted pt to telemetry for admission.  The patient appears reasonably stabilized for admission considering the current resources, flow, and capabilities available in the ED at this time, and I doubt any further screening and/or treatment in the ED prior to admission.   Filed Vitals:   09/12/14 1446 09/12/14 1500 09/12/14 2348 09/13/14 0447  BP: 136/106 134/74 152/53 116/83  Pulse: 90  99 91  Temp: 98.6 F (37 C)  98.3 F (36.8 C) 97.6 F (36.4 C)  TempSrc: Axillary  Axillary Axillary  Resp: 18  16 18     Height:      Weight:      SpO2:  92% 100% 100%   Meds given in ED:  Medications  vancomycin (VANCOCIN) IVPB 1000 mg/200 mL premix (0 mg Intravenous Stopped 09/11/14 1317)    New Prescriptions   No medications  on file       Harle Battiest, NP 09/13/14 2359  Juliet Rude. Rubin Payor, MD 09/14/14 [phone]

## 2014-09-11 NOTE — ED Notes (Addendum)
Per [facility]. [facility] at [facility]. Per staff Pt c/o of swelling to bil neck since yesterday. Dr.Stoneking requesting transport. Strong smell of urine. Per staff Denied N/V/D and fever. Pt baseline-garbled speech.

## 2014-09-11 NOTE — ED Notes (Signed)
Bed: [id number] Expected date:  Expected time:  Means of arrival:  Comments: EMS-bursitis

## 2014-09-11 NOTE — Progress Notes (Signed)
ANTIBIOTIC CONSULT NOTE - INITIAL  Pharmacy Consult for Vancomycin Indication: UTI and soft tissue infection  Allergies  Allergen Reactions  . Gabapentin     unknown  . Lorazepam     unknown  . Metoclopramide     unknown  . Penicillins     unknown  . Tape     unknown  . Teflaro [Ceftaroline]     unknown    Patient Measurements:   Adjusted Body Weight:  Vital Signs: Temp: 98.2 F (36.8 C) (01/05 0922) Temp Source: Oral (01/05 0922) BP: 150/52 mmHg (01/05 1144) Pulse Rate: 96 (01/05 1144) Intake/Output from previous day:   Intake/Output from this shift:    Labs:  Recent Labs  09/11/14 1030  WBC 26.8*  HGB 12.1  PLT 263  CREATININE 0.75   CrCl cannot be calculated (Unknown ideal weight.). No results for input(s): VANCOTROUGH, VANCOPEAK, VANCORANDOM, GENTTROUGH, GENTPEAK, GENTRANDOM, TOBRATROUGH, TOBRAPEAK, TOBRARND, AMIKACINPEAK, AMIKACINTROU, AMIKACIN in the last 72 hours.   Microbiology: Recent Results (from the past 720 hour(s))  MRSA PCR Screening     Status: None   Collection Time: 08/27/14  1:56 AM  Result Value Ref Range Status   MRSA by PCR NEGATIVE NEGATIVE Final    Comment:        The GeneXpert MRSA Assay (FDA approved for NASAL specimens only), is one component of a comprehensive MRSA colonization surveillance program. It is not intended to diagnose MRSA infection nor to guide or monitor treatment for MRSA infections.   Culture, blood (routine x 2)     Status: None   Collection Time: 08/27/14 10:50 AM  Result Value Ref Range Status   Specimen Description BLOOD LEFT ARM  Final   Special Requests BOTTLES DRAWN AEROBIC AND ANAEROBIC [facility]  Final   Culture  Setup Time   Final    08/27/2014 13:22 Performed at [facility]    Culture   Final    NO GROWTH 5 DAYS Performed at [facility]    Report Status 09/02/2014 FINAL  Final  Culture, blood (routine x 2)     Status: None   Collection Time: 08/27/14 10:55 AM  Result  Value Ref Range Status   Specimen Description BLOOD RIGHT ARM  Final   Special Requests BOTTLES DRAWN AEROBIC ONLY 5CC  Final   Culture  Setup Time   Final    08/27/2014 13:22 Performed at [facility]    Culture   Final    NO GROWTH 5 DAYS Performed at [facility]    Report Status 09/02/2014 FINAL  Final  Clostridium Difficile by PCR     Status: None   Collection Time: 08/30/14 10:17 AM  Result Value Ref Range Status   C difficile by pcr NEGATIVE NEGATIVE Final    Comment: Performed at [facility]    Medical History: Past Medical History  Diagnosis Date  . Diabetes mellitus   . Thyroid disease   . Complication of anesthesia     trouble intubating surgery  . Difficult intubation   . Arthritis   . UTI (lower urinary tract infection)     Medications:  Anti-infectives    Start     Dose/Rate Route Frequency Ordered Stop   09/11/14 2200  vancomycin (VANCOCIN) 500 mg in sodium chloride 0.9 % 100 mL IVPB     500 mg100 mL/hr over 60 Minutes Intravenous Every 12 hours 09/11/14 1310     09/11/14 1200  vancomycin (VANCOCIN) IVPB 1000 mg/200  mL premix     1,000 mg200 mL/hr over 60 Minutes Intravenous STAT 09/11/14 1154 09/12/14 1200     Assessment: 81yo F from NH w/ bilateral neck swelling and pain and strong smelling urine. Recent admission for encephalopathy and hypotension. Pharmacy is asked to dose Vancomycin for UTI and soft tissue infection. Recent weight 60kg.  1/5 >> Vanc >>  Tmax: AF WBCs: elevated Renal: SCr wnl, 56N(using SCr 0.8)  Goal of Therapy:  Vancomycin trough level 15-20 mcg/ml  Plan:  Vancomycin 1g x 1 then 500mg  IV q12h. Measure Vanc trough at steady state. Follow up renal fxn and culture results. F/u MD note and plan for additional abx to cover possible urinary pathogens.  Charolotte Ekeom Nykira Reddix, PharmD, pager [phone]. 09/11/2014,1:12 PM.

## 2014-09-11 NOTE — ED Notes (Signed)
MD at bedside. EDP DELO PRESENT TO EVALUATE THIS PT 

## 2014-09-11 NOTE — ED Notes (Signed)
Attempted to call report, phone not working. 5W stated they would tell 5E RN to return call

## 2014-09-11 NOTE — ED Notes (Signed)
MD at bedside. EDPA PRESENT 

## 2014-09-11 NOTE — ED Notes (Signed)
Spoke with Tom in pharmacy, will continue to follow for ABX orders with admission.

## 2014-09-11 NOTE — Progress Notes (Signed)
Patient admitted to 5E, transferred from ED, alert with AMS, transferred on hospital bed, tolerated well by NT, BP=161/66, P=99, Temp=98.1(A), Sats= 100% on 2L/[place], patient incont of urine with brief on with feces in noted in brief at time of transferred, skin with multiple bruises and abrasion on bilateral arms, legs and left shoulder, looks like an bite mark, sacrum with stage I pressure ulcer, pink foam applied, skin tear on bilateral forearm with dry blood, left arm quarter size, right arm dime size, patient unable to communicate at this time, no family at bedside, patient in stable condition at this time, Dr notified of patient's arrival to unit

## 2014-09-11 NOTE — ED Notes (Signed)
Patient transported to CT 

## 2014-09-11 NOTE — Progress Notes (Signed)
UR completed 

## 2014-09-11 NOTE — H&P (Addendum)
[facility] History and Physical  Naydeen Speirs QQI:[id number] DOB: Feb 27, 1925 DOA: 09/11/2014  Referring physician: ED physician PCP: No primary care provider on file.   Chief Complaint: neck swelling  HPI:  81 year old female with past medical history of diabetes, chronic kidney disease stage 2, hypothyroidism, recent admission for acute encephalopathy because of alcohol withdrawal who presented to [facility] ED from [facility] for neck swelling for past 24 hours prior to this admission. No associated fevers or chills. Patient is not a good historian due to dementia. No complaints of respiratory distress. No complaints of vomiting. No diarrhea or constipation. No blood per rectum. No falls or loss of consciousness.  In ED, BP was 140/56, HR 100, RR 20, T max 98.2 F and oxygen saturation 94% on room air. Blood work revealed leukocytosis of 26.8 otherwise unremarkable. Maxillofacial CT showed acute parotitis right greater than left without abscess or mass. UA showed large leukocytes and too numerous to count WBC. Pt was started on broad spectrum antibiotics on admission, vancomycin and aztreonam.   Assessment/Plan:  Principal Problem:  Sepsis secondary to parotitis and UTI / leukocytosis   Sepsis criteria met on admission with tachycardia, tachypnea, leukocytosis and evidence of infection based on maxillofacial CT and UA.  Maxillofacial CT showed acute parotitis right greater than left without abscess or mass. UA showed large leukocytes and too numerous to count WBC.  Pt started on broad spectrum antibiotics on admission, vanco and aztreonam   Follow up blood cultures, urine culture results  Continue IV fluids for next 24 hours.  Clear liquid diet for now. Advance as tolerated.    Active Problems:   Gout  Resume allopurinol 100 mg PO BID    Hypertension  Resume Cardizem 240 mg PO daily     Diabetes, controlled  Recent A1c 5.8 indicating good glycemic control.  Continue  Lantus 8 units at bedtime     GERD (gastroesophageal reflux disease)  Continue  protonix     Acute on chronic renal failure (CKD2-3)  Baseline creatinine 0.9-1.2. Creatinine is WNL on this admission.     Elevated point-of-care troponin  Mild elevation in troponin at 0.04. Likely demand ischemia from sepsis.  Has had mild troponin elevation on recent admission.    Hypothyroidism   Continue Synthroid    DVT prophylaxis   Lovenox subQ ordered.   Radiological Exams on Admission: Ct Maxillofacial W/cm 09/11/2014   Acute parotitis right greater than left without abscess or mass.  Chronic sinusitis right maxillary sinus pain      Code Status: DNR/DNI Family Communication: no family at the bedside  Disposition Plan: Admit for further evaluation     Review of Systems:  Unable to obtain due to dementia    Past Medical History  Diagnosis Date  . Diabetes mellitus   . Thyroid disease   . Complication of anesthesia     trouble intubating surgery  . Difficult intubation   . Arthritis   . UTI (lower urinary tract infection)     Past Surgical History  Procedure Laterality Date  . Abdominal hysterectomy    . Hemorroidectomy    . Medial partial knee replacement    . Thyroidectomy, partial    . Shoulder surgery Right     "had shredded a muscle"  . Achilles tendon surgery Left   . Foot surgery Left     left heel infection that went to bone per spouse  . Lower abdominel surgery    . Esophagogastroduodenoscopy (egd)  with propofol N/A 08/28/2014    Procedure: ESOPHAGOGASTRODUODENOSCOPY (EGD) WITH PROPOFOL;  Surgeon: Inda Castle, MD;  Location: [facility];  Service: Endoscopy;  Laterality: N/A;  . Colonoscopy N/A 08/29/2014    Procedure: COLONOSCOPY;  Surgeon: Inda Castle, MD;  Location: [facility];  Service: Endoscopy;  Laterality: N/A;    Social History:  reports that she has never smoked. She has never used smokeless tobacco. She reports that she drinks  alcohol. She reports that she does not use illicit drugs.  Allergies  Allergen Reactions  . Ciprofloxacin   . Gabapentin     unknown  . Lorazepam     unknown  . Metoclopramide     unknown  . Penicillins     unknown  . Tape     unknown  . Teflaro [Ceftaroline]     unknown    No family history on file.  Prior to Admission medications   Medication Sig Start Date End Date Taking? Authorizing Provider  allopurinol (ZYLOPRIM) 100 MG tablet Take 100 mg by mouth 2 (two) times daily.   Yes Historical Provider, MD  Amino Acids-Protein Hydrolys (FEEDING SUPPLEMENT, PRO-STAT SUGAR FREE 64,) LIQD Take 30 mLs by mouth daily.   Yes Historical Provider, MD  calcium-vitamin D (OSCAL WITH D) 500-200 MG-UNIT per tablet Take 1 tablet by mouth 2 (two) times daily.    Yes Historical Provider, MD  cholecalciferol (VITAMIN D) 1000 UNITS tablet Take 1,000 Units by mouth daily.   Yes Historical Provider, MD  diltiazem (DILACOR XR) 240 MG 24 hr capsule Take 240 mg by mouth daily.   Yes Historical Provider, MD  divalproex (DEPAKOTE ER) 250 MG 24 hr tablet Take 250-500 mg by mouth 2 (two) times daily. 1 tablet in the am and 2 tablets in the pm   Yes Historical Provider, MD  fentaNYL (DURAGESIC - DOSED MCG/HR) 25 MCG/HR patch Place 1 patch (25 mcg total) onto the skin every 3 (three) days. 09/04/14  Yes Reyne Dumas, MD  ferrous sulfate 325 (65 FE) MG tablet Take 1 tablet (325 mg total) by mouth 3 (three) times daily with meals. 09/13/13  Yes Velvet Bathe, MD  folic acid (FOLVITE) 1 MG tablet Take 1 tablet (1 mg total) by mouth daily. 09/04/14  Yes Reyne Dumas, MD  hydrocortisone (ANUSOL-HC) 25 MG suppository Place 25 mg rectally 2 (two) times daily as needed for hemorrhoids.  09/09/13  Yes Historical Provider, MD  insulin aspart (NOVOLOG) 100 UNIT/ML injection Inject 1-9 Units into the skin 3 (three) times daily before meals. Sliding scale: 121-150= 1 unit; 151-200= 2 units; 201-250= 3 units; 251-300= 5 units;  301-350= 7 units; 351-400= 9 units   Yes Historical Provider, MD  insulin glargine (LANTUS) 100 UNIT/ML injection Inject 8 Units into the skin at bedtime.   Yes Historical Provider, MD  latanoprost (XALATAN) 0.005 % ophthalmic solution Place 1 drop into both eyes at bedtime.   Yes Historical Provider, MD  levothyroxine (SYNTHROID, LEVOTHROID) 75 MCG tablet Take 75 mcg by mouth daily before breakfast.   Yes Historical Provider, MD  magnesium oxide (MAG-OX) 400 (241.3 MG) MG tablet Take 1 tablet (400 mg total) by mouth 2 (two) times daily. 09/04/14  Yes Reyne Dumas, MD  Multiple Vitamins-Minerals (THERA-M PO) Take 1 tablet by mouth 2 (two) times daily.   Yes Historical Provider, MD  omeprazole (PRILOSEC) 20 MG capsule Take 20 mg by mouth daily.   Yes Historical Provider, MD  pantoprazole (PROTONIX) 40 MG tablet Take 1  tablet (40 mg total) by mouth daily. 09/04/14  Yes Reyne Dumas, MD  psyllium (REGULOID) 0.52 G capsule Take 0.52 g by mouth at bedtime.   Yes Historical Provider, MD  traMADol (ULTRAM) 50 MG tablet Take 1 tablet (50 mg total) by mouth every 6 (six) hours as needed. 09/04/14  Yes Reyne Dumas, MD  traZODone (DESYREL) 50 MG tablet Take 1 tablet (50 mg total) by mouth 3 (three) times daily as needed for sleep. 09/03/14  Yes Reyne Dumas, MD  LORazepam (ATIVAN) 0.5 MG tablet Take 1 tablet (0.5 mg total) by mouth every 6 (six) hours as needed for anxiety. Patient not taking: Reported on 09/11/2014 09/04/14   Reyne Dumas, MD    Physical Exam: Filed Vitals:   09/11/14 0919 09/11/14 0922 09/11/14 1144  BP:  140/56 150/52  Pulse:  90 96  Temp:  98.2 F (36.8 C)   TempSrc:  Oral   Resp:  20 19  SpO2: 98% 94% 94%    Physical Exam  Constitutional: Appears not in distress  HENT: Normocephalic. External right and left ear normal. Oropharynx is clear and moist.  Eyes: Conjunctivae and EOM are normal. PERRLA, no scleral icterus.  Neck: swelling (+).  No JVD. No tracheal deviation. No  thyromegaly.  CVS: slight tachycardia, S1/S2 appreciated .  Pulmonary: Effort and breath sounds normal, no stridor, rhonchi, wheezes, rales.  Abdominal: Soft. BS +,  no distension, tenderness, rebound or guarding.  Musculoskeletal: Normal range of motion. No edema and no tenderness.  Lymphadenopathy: No lymphadenopathy noted, cervical, inguinal. Neuro: Alert. Normal reflexes, muscle tone coordination. No cranial nerve deficit. Skin: Skin is warm and dry. No rash noted. Not diaphoretic. No erythema. No pallor.  Psychiatric: Normal mood and affect. Behavior, judgment, thought content normal.   Labs on Admission:  Basic Metabolic Panel:  Recent Labs Lab 09/11/14 1030  NA 141  K 3.7  CL 99  CO2 30  GLUCOSE 105*  BUN 21  CREATININE 0.75  CALCIUM 8.5   Liver Function Tests:  Recent Labs Lab 09/11/14 1030  AST 29  ALT 31  ALKPHOS 153*  BILITOT 0.4  PROT 5.9*  ALBUMIN 2.4*   No results for input(s): LIPASE, AMYLASE in the last 168 hours. No results for input(s): AMMONIA in the last 168 hours. CBC:  Recent Labs Lab 09/11/14 1030  WBC 26.8*  NEUTROABS 22.8*  HGB 12.1  HCT 37.9  MCV 102.2*  PLT 263   Cardiac Enzymes:  Recent Labs Lab 09/11/14 1030  TROPONINI 0.04*   BNP: Invalid input(s): POCBNP CBG: No results for input(s): GLUCAP in the last 168 hours.   Faye Ramsay, MD  [facility] Pager [phone]  If 7PM-7AM, please contact night-coverage [email or web address] Password [email or web address] 09/11/2014, 2:11 PM

## 2014-09-11 NOTE — Progress Notes (Signed)
ANTIBIOTIC CONSULT NOTE   See previous progress note from Charolotte Ekeom Pickering, PharmD for full details.  In brief, 81 year old female admitted from NH with bilateral neck swelling and foul smelling urine.  Started on vancomycin for gram positive coverage, now Pharmacy is consulted to dose aztreonam for gram negative coverage given multiple antibiotic allergies.  1/5 >> Vancomycin >> 1/5 >> Aztreonam >>  Tmax: Afebrile WBCs: elevated, 26.8k Renal: SCr 0.75, 56 ml/min normalized (using SCr 0.8)  No cultures currently ordered UA c/w infection   Plan:  Aztreonam 1g IV q8h Follow up renal function & cultures, clinical course  Loralee PacasErin Freedom Peddy, PharmD, BCPS Pager: [phone] 09/11/2014 3:09 PM

## 2014-09-12 DIAGNOSIS — E119 Type 2 diabetes mellitus without complications: Secondary | ICD-10-CM

## 2014-09-12 DIAGNOSIS — I10 Essential (primary) hypertension: Secondary | ICD-10-CM

## 2014-09-12 DIAGNOSIS — R778 Other specified abnormalities of plasma proteins: Secondary | ICD-10-CM | POA: Diagnosis present

## 2014-09-12 DIAGNOSIS — R7989 Other specified abnormal findings of blood chemistry: Secondary | ICD-10-CM

## 2014-09-12 DIAGNOSIS — K1120 Sialoadenitis, unspecified: Secondary | ICD-10-CM

## 2014-09-12 DIAGNOSIS — Z794 Long term (current) use of insulin: Secondary | ICD-10-CM

## 2014-09-12 LAB — BASIC METABOLIC PANEL
Anion gap: 6 (ref 5–15)
BUN: 20 mg/dL (ref 6–23)
CALCIUM: 7.9 mg/dL — AB (ref 8.4–10.5)
CO2: 32 mmol/L (ref 19–32)
CREATININE: 0.63 mg/dL (ref 0.50–1.10)
Chloride: 100 mEq/L (ref 96–112)
GFR calc Af Amer: >90 mL/min (ref >90)
GFR calc non Af Amer: 78 mL/min — ABNORMAL LOW (ref >90)
Glucose, Bld: 101 mg/dL — ABNORMAL HIGH (ref 70–99)
POTASSIUM: 3.5 mmol/L (ref 3.5–5.1)
Sodium: 138 mmol/L (ref 135–145)

## 2014-09-12 LAB — CBC
HEMATOCRIT: 33.1 % — AB (ref 36.0–46.0)
Hemoglobin: 10.7 g/dL — ABNORMAL LOW (ref 12.0–15.0)
MCH: 32.9 pg (ref 26.0–34.0)
MCHC: 32.3 g/dL (ref 30.0–36.0)
MCV: 101.8 fL — ABNORMAL HIGH (ref 78.0–100.0)
PLATELETS: 244 10*3/uL (ref 150–400)
RBC: 3.25 MIL/uL — ABNORMAL LOW (ref 3.87–5.11)
RDW: 15.3 % (ref 11.5–15.5)
WBC: 30.1 10*3/uL — AB (ref 4.0–10.5)

## 2014-09-12 LAB — TROPONIN I
TROPONIN I: 0.04 ng/mL — AB (ref <0.031)
Troponin I: 0.12 ng/mL — ABNORMAL HIGH (ref <0.031)

## 2014-09-12 LAB — GLUCOSE, CAPILLARY
GLUCOSE-CAPILLARY: 78 mg/dL (ref 70–99)
Glucose-Capillary: 65 mg/dL — ABNORMAL LOW (ref 70–99)

## 2014-09-12 MED ORDER — BOOST / RESOURCE BREEZE PO LIQD
1.0000 | Freq: Three times a day (TID) | ORAL | Status: DC
  Administered 2014-09-17 – 2014-09-20 (×5): 1 via ORAL

## 2014-09-12 MED ORDER — FOLIC ACID 1 MG PO TABS
1.0000 mg | ORAL_TABLET | Freq: Every day | ORAL | Status: DC
  Administered 2014-09-17 – 2014-09-20 (×4): 1 mg via ORAL
  Filled 2014-09-12 (×9): qty 1

## 2014-09-12 MED ORDER — VANCOMYCIN HCL 500 MG IV SOLR
500.0000 mg | Freq: Two times a day (BID) | INTRAVENOUS | Status: DC
  Administered 2014-09-12 – 2014-09-13 (×4): 500 mg via INTRAVENOUS
  Filled 2014-09-12 (×5): qty 500

## 2014-09-12 MED ORDER — LORAZEPAM 1 MG PO TABS: 1.0000 mg | ORAL_TABLET | Freq: Four times a day (QID) | ORAL | Status: DC | PRN

## 2014-09-12 MED ORDER — ADULT MULTIVITAMIN W/MINERALS CH
1.0000 | ORAL_TABLET | Freq: Every day | ORAL | Status: DC
  Administered 2014-09-17 – 2014-09-20 (×4): 1 via ORAL
  Filled 2014-09-12 (×9): qty 1

## 2014-09-12 MED ORDER — THIAMINE HCL 100 MG/ML IJ SOLN
100.0000 mg | Freq: Every day | INTRAMUSCULAR | Status: DC
  Administered 2014-09-12 – 2014-09-17 (×3): 100 mg via INTRAVENOUS
  Filled 2014-09-12 (×9): qty 1

## 2014-09-12 MED ORDER — LORAZEPAM 2 MG/ML IJ SOLN
1.0000 mg | Freq: Four times a day (QID) | INTRAMUSCULAR | Status: DC | PRN
  Administered 2014-09-12 – 2014-09-14 (×5): 1 mg via INTRAVENOUS
  Filled 2014-09-12 (×5): qty 1

## 2014-09-12 MED ORDER — VITAMIN B-1 100 MG PO TABS
100.0000 mg | ORAL_TABLET | Freq: Every day | ORAL | Status: DC
  Administered 2014-09-18 – 2014-09-20 (×3): 100 mg via ORAL
  Filled 2014-09-12 (×9): qty 1

## 2014-09-12 NOTE — Progress Notes (Signed)
INITIAL NUTRITION ASSESSMENT  DOCUMENTATION CODES Per approved criteria  -Not Applicable   INTERVENTION: - Resource Breeze po TID, each supplement provides 250 kcal and 9 grams of protein - Multivitamin with minerals - RD will continue to monitor  NUTRITION DIAGNOSIS: Inadequate oral intake related to inability to eat as evidenced by clear liquid diet.   Goal: Pt to meet >/= 90% of their estimated nutrition needs   Monitor:  Weight trend, po intake, acceptance of supplements, labs  Reason for Assessment: MST, Low Braden Score  81 y.o. female  Admitting Dx: Parotitis  ASSESSMENT: 81 year old female with past medical history of diabetes, chronic kidney disease stage 2, hypothyroidism, recent admission for acute encephalopathy because of alcohol withdrawal who [facility] for past 24 hours prior to this admission.   - Pt with no recent weight loss. Has dementia at baseline. Current diet is clear liquids. Pt with stage I pressure ulcer on sacrum. Found to have UTI. Pt yelling in room with RN when RD stopped by. Appeared to be extremely agitated. Will follow-up at a later time.  - Nutrition-focused physical exam unable to be completed.   Labs: Na and K WNL BUN WNL  Height: Ht Readings from Last 1 Encounters:  09/12/14 5\' 5"  (1.651 m)    Weight: Wt Readings from Last 1 Encounters:  09/12/14 132 lb 4.4 oz (60 kg)    Ideal Body Weight: 57 kg  % Ideal Body Weight: 105%  Wt Readings from Last 10 Encounters:  09/12/14 132 lb 4.4 oz (60 kg)  08/27/14 132 lb 4.4 oz (60 kg)  09/13/13 148 lb 6.4 oz (67.314 kg)    Usual Body Weight: 132 lbs  % Usual Body Weight: 100%  BMI:  Body mass index is 22.01 kg/(m^2).  Estimated Nutritional Needs: Kcal: 1600-1800 Protein: 90-100 g Fluid: 1.8 L/day  Skin: stage I pressure ulcer on buttocks  Diet Order: Diet clear liquid  EDUCATION NEEDS: -Education not appropriate at this  time   Intake/Output Summary (Last 24 hours) at 09/12/14 1542 Last data filed at 09/12/14 1500  Gross per 24 hour  Intake 1141.25 ml  Output    350 ml  Net 791.25 ml    Last BM: prior to admission   Labs:   Recent Labs Lab 09/11/14 1030 09/12/14 0445  NA 141 138  K 3.7 3.5  CL 99 100  CO2 30 32  BUN 21 20  CREATININE 0.75 0.63  CALCIUM 8.5 7.9*  GLUCOSE 105* 101*    CBG (last 3)   Recent Labs  09/11/14 2102 09/12/14 0728  GLUCAP 110* 78    Scheduled Meds: . allopurinol  100 mg Oral BID  . aztreonam  1 g Intravenous Q8H  . diltiazem  240 mg Oral Daily  . divalproex  250 mg Oral Daily  . divalproex  500 mg Oral QHS  . enoxaparin (LOVENOX) injection  40 mg Subcutaneous Q24H  . ferrous sulfate  325 mg Oral TID WC  . folic acid  1 mg Oral Daily  . insulin glargine  8 Units Subcutaneous QHS  . latanoprost  1 drop Both Eyes QHS  . levothyroxine  75 mcg Oral QAC breakfast  . magnesium oxide  400 mg Oral BID  . multivitamin with minerals  1 tablet Oral Daily  . pantoprazole  40 mg Oral Daily  . psyllium  1 packet Oral QHS  . thiamine  100 mg Oral Daily  Or  . thiamine  100 mg Intravenous Daily  . vancomycin  500 mg Intravenous Q12H    Continuous Infusions: . sodium chloride 75 mL/hr at 09/12/14 5621    Past Medical History  Diagnosis Date  . Diabetes mellitus   . Thyroid disease   . Complication of anesthesia     trouble intubating surgery  . Difficult intubation   . Arthritis   . UTI (lower urinary tract infection)     Past Surgical History  Procedure Laterality Date  . Abdominal hysterectomy    . Hemorroidectomy    . Medial partial knee replacement    . Thyroidectomy, partial    . Shoulder surgery Right     "had shredded a muscle"  . Achilles tendon surgery Left   . Foot surgery Left     left heel infection that went to bone per spouse  . Lower abdominel surgery    . Esophagogastroduodenoscopy (egd) with propofol N/A 08/28/2014     Procedure: ESOPHAGOGASTRODUODENOSCOPY (EGD) WITH PROPOFOL;  Surgeon: Louis Meckel, MD;  Location: [facility];  Service: Endoscopy;  Laterality: N/A;  . Colonoscopy N/A 08/29/2014    Procedure: COLONOSCOPY;  Surgeon: Louis Meckel, MD;  Location: [facility];  Service: Endoscopy;  Laterality: N/A;    Emmaline Kluver MS, RD, LDN

## 2014-09-12 NOTE — Progress Notes (Signed)
Patient disoriented moaning and very irritable, son and daughter in-law at bedside with concerns about patient's condition, but it appears that the son is very upset with the care patient receive at last visit about two weeks ago, very focus on why the patient was sent home from last hospital stay, stated," her condition with the Parostitis was not as aggressive as it is now and why did the doctors send her home, was that to soon" family wanted to know how regular did staff check on patient, and if her condition was like this now do to something that the medical staff did not notice when she was here at her last stay, inform son that the patient had a UTI which could be the cause of most of her confusion, and being in several different facilities could increase her confusion not knowing where her is, son just seem to want to get answers to questions I could not answers, informed son I would talk with the doctor on tomorrow morning and have them call him to answer his concerns, son seem to want to blame hospital for his mother's condition, patient still very irritable but more calm now, will continue to follow patient and report concern to staff in the morning.

## 2014-09-12 NOTE — Progress Notes (Signed)
Clinical Social Work Department BRIEF PSYCHOSOCIAL ASSESSMENT 09/12/2014  Patient:  Kaitlyn Burns, Kaitlyn Burns     Account Number:  [id number]     Admit date:  09/11/2014  Clinical Social Worker:  Earlie Server  Date/Time:  09/12/2014 10:30 AM  Referred by:  Physician  Date Referred:  09/12/2014 Referred for  SNF Placement   Other Referral:   Interview type:  Family Other interview type:   Facility[facility] DATA Living Status:  FACILITY Admitted from facility:  [facility] Level of care:  [place] Primary support name:  Kaitlyn Burns Primary support relationship to patient:  CHILD, ADULT Degree of support available:   Strong    CURRENT CONCERNS Current Concerns  Post-Acute Placement   Other Concerns:    SOCIAL WORK ASSESSMENT / PLAN CSW received referral in order to assist with DC planning. CSW reviewed chart and met with patient at bedside. Patient confused and unable to fully participate in assessment. CSW called and spoke with son who reports that he has completed bed hold and wants patient to return at DC. Son reports he wants to talk with MD about medications that patient is on and neck problems because patient was having neck problems prior to DC around Christmas.    CSW spoke with [facility] who reports patient was recently admitted to their facility for SNF. Patient was living at ALF prior to admission to SNF. SNF reports that son, Kaitlyn Burns, is POA and handles patient's affairs. SNF reports family is completing a bed hold and they can accept patient when medically stable.    Patient does not currently have SNF PASRR number and still have level K. SNF agreeable to accept patient back when medically stable. CSW spoke with [facility] and there are discrepancies with social security number. Son to check on SS # and will let CSW know.    CSW completed FL2 and will continue to follow.   Assessment/plan status:  Psychosocial  Support/Ongoing Assessment of Needs Other assessment/ plan:   Information/referral to community resources:   Will return to SNF    PATIENT'S/FAMILY'S RESPONSE TO PLAN OF CARE: Patient unable to fully participate in assessment. Son engaged in assessment and thanked CSW for call. Son wants to talk with [facility] administrator and MD re: patient's medical plans. Son reports he is happy that patient will return to SNF because he knows that patient is requiring more assistance at this time. Son has CSW contact information if further needs arise.       [place],  [phone]

## 2014-09-12 NOTE — Consult Note (Signed)
CARDIOLOGY CONSULTATION NOTE.  NAME:  Kaitlyn Burns   MRN: [id number] DOB:  05-12-1925   ADMIT DATE: 09/11/2014  Reason for Consult: Elevated troponin of 0.4  Requesting Physician: Dr. Danie Binder; [place]  Primary Cardiologist: N/A  HPI: This is a 81 y.o. female with a past medical history noted below with relatively significant dementia and acute encephalopathy from all withdrawal. She was admitted for neck swelling from a nursing facility. She has a significantly elevated white blood cell count and is being considered positive for sepsis for parotitis and UTI. Forcefully when I came to evaluate her she had been given Ativan for agitation and was essentially unresponsive. I was not able to obtain any history from the patient herself. History is obtained from the H&P H&P notes that she is a very poor historian due to dementia. Had not had any fevers or chills. There was no suggestion of chest tightness or pressure or any respiratory discomfort.  We are consult due to elevated troponin levels. I'm not clear of the indication for why this was checked.  PMHx:  CARDIAC HISTORY: No reported echocardiogram or stress test. Prior EKGs have showed anterolateral ST depressions with T-wave inversions, cannot exclude ischemia. However these are probably related to LVH and repolarization changes.  Past Medical History  Diagnosis Date  . Diabetes mellitus   . Thyroid disease   . Complication of anesthesia     trouble intubating surgery  . Difficult intubation   . Arthritis   . UTI (lower urinary tract infection)    Past Surgical History  Procedure Laterality Date  . Abdominal hysterectomy    . Hemorroidectomy    . Medial partial knee replacement    . Thyroidectomy, partial    . Shoulder surgery Right     "had shredded a muscle"  . Achilles tendon surgery Left   . Foot surgery Left     left heel infection that went to bone per spouse  . Lower abdominel surgery    . Esophagogastroduodenoscopy  (egd) with propofol N/A 08/28/2014    Procedure: ESOPHAGOGASTRODUODENOSCOPY (EGD) WITH PROPOFOL;  Surgeon: Louis Meckel, MD;  Location: [facility];  Service: Endoscopy;  Laterality: N/A;  . Colonoscopy N/A 08/29/2014    Procedure: COLONOSCOPY;  Surgeon: Louis Meckel, MD;  Location: [facility];  Service: Endoscopy;  Laterality: N/A;    FAMHx: History reviewed. No pertinent family history.  SOCHx:  reports that she has never smoked. She has never used smokeless tobacco. She reports that she drinks alcohol. She reports that she does not use illicit drugs.  ALLERGIES: Allergies  Allergen Reactions  . Ciprofloxacin   . Gabapentin     unknown  . Lorazepam     unknown  . Metoclopramide     unknown  . Penicillins     unknown  . Tape     unknown  . Teflaro [Ceftaroline]     unknown    ROS: Unable to obtain as the patient has been sedated.  HOME MEDICATIONS: Prescriptions prior to admission  Medication Sig Dispense Refill Last Dose  . allopurinol (ZYLOPRIM) 100 MG tablet Take 100 mg by mouth 2 (two) times daily.   09/10/2014 at 2000  . Amino Acids-Protein Hydrolys (FEEDING SUPPLEMENT, PRO-STAT SUGAR FREE 64,) LIQD Take 30 mLs by mouth daily.   09/10/2014 at 0800  . calcium-vitamin D (OSCAL WITH D) 500-200 MG-UNIT per tablet Take 1 tablet by mouth 2 (two) times daily.    09/10/2014 at 2000  . cholecalciferol (  VITAMIN D) 1000 UNITS tablet Take 1,000 Units by mouth daily.   09/10/2014 at 0800  . diltiazem (DILACOR XR) 240 MG 24 hr capsule Take 240 mg by mouth daily.   09/10/2014 at 0800  . divalproex (DEPAKOTE ER) 250 MG 24 hr tablet Take 250-500 mg by mouth 2 (two) times daily. 1 tablet in the am and 2 tablets in the pm   09/10/2014 at 2000  . fentaNYL (DURAGESIC - DOSED MCG/HR) 25 MCG/HR patch Place 1 patch (25 mcg total) onto the skin every 3 (three) days. 10 patch 0 09/10/2014 at Unknown time  . ferrous sulfate 325 (65 FE) MG tablet Take 1 tablet (325 mg total) by mouth 3 (three) times  daily with meals. 90 tablet 0 09/10/2014 at 1700  . folic acid (FOLVITE) 1 MG tablet Take 1 tablet (1 mg total) by mouth daily. 30 tablet 2 09/10/2014 at 0800  . hydrocortisone (ANUSOL-HC) 25 MG suppository Place 25 mg rectally 2 (two) times daily as needed for hemorrhoids.    unknown  . insulin aspart (NOVOLOG) 100 UNIT/ML injection Inject 1-9 Units into the skin 3 (three) times daily before meals. Sliding scale: 121-150= 1 unit; 151-200= 2 units; 201-250= 3 units; 251-300= 5 units; 301-350= 7 units; 351-400= 9 units   09/10/2014 at Unknown time  . insulin glargine (LANTUS) 100 UNIT/ML injection Inject 8 Units into the skin at bedtime.   09/10/2014 at 2100  . latanoprost (XALATAN) 0.005 % ophthalmic solution Place 1 drop into both eyes at bedtime.   09/10/2014 at 2100  . levothyroxine (SYNTHROID, LEVOTHROID) 75 MCG tablet Take 75 mcg by mouth daily before breakfast.   09/11/2014 at 0600  . magnesium oxide (MAG-OX) 400 (241.3 MG) MG tablet Take 1 tablet (400 mg total) by mouth 2 (two) times daily. 60 tablet 2 09/10/2014 at 2000  . Multiple Vitamins-Minerals (THERA-M PO) Take 1 tablet by mouth 2 (two) times daily.   09/10/2014 at 2000  . omeprazole (PRILOSEC) 20 MG capsule Take 20 mg by mouth daily.   09/11/2014 at 0600  . pantoprazole (PROTONIX) 40 MG tablet Take 1 tablet (40 mg total) by mouth daily. 30 tablet 2 09/10/2014 at 0800  . psyllium (REGULOID) 0.52 G capsule Take 0.52 g by mouth at bedtime.   09/10/2014 at 2100  . traMADol (ULTRAM) 50 MG tablet Take 1 tablet (50 mg total) by mouth every 6 (six) hours as needed. 30 tablet 0 09/10/2014 at Unknown time  . traZODone (DESYREL) 50 MG tablet Take 1 tablet (50 mg total) by mouth 3 (three) times daily as needed for sleep. 30 tablet 0 09/11/2014 at 00:00  . LORazepam (ATIVAN) 0.5 MG tablet Take 1 tablet (0.5 mg total) by mouth every 6 (six) hours as needed for anxiety. (Patient not taking: Reported on 09/11/2014) 30 tablet 0 Not Taking at Unknown time    HOSPITAL  MEDICATIONS: Scheduled Meds: . allopurinol  100 mg Oral BID  . aztreonam  1 g Intravenous Q8H  . diltiazem  240 mg Oral Daily  . divalproex  250 mg Oral Daily  . divalproex  500 mg Oral QHS  . enoxaparin (LOVENOX) injection  40 mg Subcutaneous Q24H  . feeding supplement (RESOURCE BREEZE)  1 Container Oral TID BM  . ferrous sulfate  325 mg Oral TID WC  . folic acid  1 mg Oral Daily  . insulin glargine  8 Units Subcutaneous QHS  . latanoprost  1 drop Both Eyes QHS  . levothyroxine  75 mcg  Oral QAC breakfast  . magnesium oxide  400 mg Oral BID  . multivitamin with minerals  1 tablet Oral Daily  . pantoprazole  40 mg Oral Daily  . psyllium  1 packet Oral QHS  . thiamine  100 mg Oral Daily   Or  . thiamine  100 mg Intravenous Daily  . vancomycin  500 mg Intravenous Q12H   Continuous Infusions: . sodium chloride 75 mL/hr at 09/12/14 0649   PRN Meds:.hydrocortisone, LORazepam **OR** LORazepam, morphine injection, ondansetron **OR** ondansetron (ZOFRAN) IV, traZODone  VITALS: Blood pressure 134/74, pulse 90, temperature 98.6 F (37 C), temperature source Axillary, resp. rate 18, height 5\' 5"  (1.651 m), weight 132 lb 4.4 oz (60 kg), SpO2 92 %.  PHYSICAL EXAM: General appearance: Obtunded/sedated and not responsive. Resting comfortably. Apparently was agitated requiring sedation before I saw her. Neck: no carotid bruit, no JVD and Positive for parotid swelling and appears to be bilateral. Lungs: clear to auscultation bilaterally, normal percussion bilaterally and Nonlabored Heart: RRR, normal S1 and S2. She does have a 1-2/6 SEM at left upper sternal border. Does not radiate. Unable to palpate PMI. No other rubs or gallops. Abdomen: soft, non-tender; bowel sounds normal; no masses,  no organomegaly Extremities: extremities normal, atraumatic, no cyanosis or edema Pulses: 2+ and symmetric Skin: Warm and dry. Decreased turgor Neurologic: Mental status: alertness: Just received Ativan;  unable to perform neurologic evaluation or psychiatric evaluation The patient is lying in the lateral position in the bed not responding. Limited cooperation on exam.  LABS: Results for orders placed or performed during the hospital encounter of 09/11/14 (from the past 24 hour(s))  Glucose, capillary     Status: Abnormal   Collection Time: 09/11/14  9:02 PM  Result Value Ref Range   Glucose-Capillary 110 (H) 70 - 99 mg/dL  Basic metabolic panel     Status: Abnormal   Collection Time: 09/12/14  4:45 AM  Result Value Ref Range   Sodium 138 135 - 145 mmol/L   Potassium 3.5 3.5 - 5.1 mmol/L   Chloride 100 96 - 112 mEq/L   CO2 32 19 - 32 mmol/L   Glucose, Bld 101 (H) 70 - 99 mg/dL   BUN 20 6 - 23 mg/dL   Creatinine, Ser 1.61 0.50 - 1.10 mg/dL   Calcium 7.9 (L) 8.4 - 10.5 mg/dL   GFR calc non Af Amer 78 (L) >90 mL/min   GFR calc Af Amer >90 >90 mL/min   Anion gap 6 5 - 15  CBC     Status: Abnormal   Collection Time: 09/12/14  4:45 AM  Result Value Ref Range   WBC 30.1 (H) 4.0 - 10.5 K/uL   RBC 3.25 (L) 3.87 - 5.11 MIL/uL   Hemoglobin 10.7 (L) 12.0 - 15.0 g/dL   HCT 09.6 (L) 04.5 - 40.9 %   MCV 101.8 (H) 78.0 - 100.0 fL   MCH 32.9 26.0 - 34.0 pg   MCHC 32.3 30.0 - 36.0 g/dL   RDW 81.1 91.4 - 78.2 %   Platelets 244 150 - 400 K/uL  Glucose, capillary     Status: None   Collection Time: 09/12/14  7:28 AM  Result Value Ref Range   Glucose-Capillary 78 70 - 99 mg/dL   Comment 1 Notify RN   Troponin I (q 6hr x 3)     Status: Abnormal   Collection Time: 09/12/14 12:00 PM  Result Value Ref Range   Troponin I 0.04 (H) <0.031 ng/mL  IMAGING: Ct Maxillofacial W/cm  09/11/2014   CLINICAL DATA:  Facial swelling  EXAM: CT MAXILLOFACIAL WITH CONTRAST  TECHNIQUE: Multidetector CT imaging of the maxillofacial structures was performed with intravenous contrast. Multiplanar CT image reconstructions were also generated. A small metallic BB was placed on the right temple in order to reliably  differentiate right from left.  CONTRAST:  OMNIPAQUE IOHEXOL 300 MG/ML  SOLN  COMPARISON:  None.  FINDINGS: Right carotid is enlarged and shows diffuse hyper enhancement consistent with parotitis. No underlying mass or abscess. Left parotid gland shows mild enhancement, with overlying skin edema.  Submandibular gland normal in size and enhancement bilaterally.  Tongue and pharynx normal.  Visualized intracranial contents show no acute abnormality.  Chronic sinusitis right maxillary sinus which is nearly completely opacified with diffuse bony thickening. No acute bony change. Advanced degenerative change in the TMJ bilaterally.  IMPRESSION: Acute parotitis right greater than left without abscess or mass.  Chronic sinusitis right maxillary sinus pain   Electronically Signed   By: Marlan Palau M.D.   On: 09/11/2014 11:52   EKG: Sinus rhythm with PACs, rate 96; anterolateral ST depressions with T-wave inversions that are likely repolarization changes but cannot exclude ischemia.  IMPRESSION: Principal Problem:   Parotitis Active Problems:   Elevated troponin I level   Gout   Hypertension   Diabetes   GERD (gastroesophageal reflux disease)   Diabetes mellitus   Sepsis  Elderly woman with dementia and history of prior mildly elevated troponin levels who is here for sepsis due to both UTI and parotitis. No clear cardiac symptoms to suggest ischemia. I would suggest this is simply an elevated troponin level from mild demand ischemia related to sepsis. I would not anticoagulate him treat as an acute coronary syndrome.  Would not be unreasonable with her abnormal EKG (not acute changes) to check a 2-D echocardiogram. She does have a murmur on exam as well.  She is otherwise hemodynamically stable with no significant tachycardia or hypotension.  For now and would not make any additional medication changes.  Depending upon her echocardiographic evaluation, I don't know that any additional cardiac  evaluation needs to be done.  Marykay Lex, M.D., M.S. Interventional Cardiologist   Pager # [phone]

## 2014-09-12 NOTE — Progress Notes (Signed)
[facility] PROGRESS NOTE  Drue Stager YIF:[id number] DOB: Jan 08, 1925 DOA: 09/11/2014 PCP: Reita Cliche, MD 81 year old lady recently discharged from the hospital after being treated for, gi bleed,  alcohol withdrawal and acute encephalopathy presents with parotitis, and urinary tract infection. Labs drawn on admission show elevated troponin and EKG shows repolarization changes and t wave inversions in the lateral leads. She is confused and not a great historian.  Assessment/Plan: Sepsis secondary to parotitis and UTI / leukocytosis   Sepsis criteria met on admission with tachycardia, tachypnea, leukocytosis and evidence of infection based on maxillofacial CT and UA.  Maxillofacial CT showed acute parotitis right greater than left without abscess or mass. UA showed large leukocytes and too numerous to count WBC.  Pt started on broad spectrum antibiotics on admission, vanco and aztreonam   Follow up blood cultures, urine culture results  Continue IV fluids for next 24 hours.  Clear liquid diet for now. Advance as tolerated.   Active Problems:  Gout  Resume allopurinol 100 mg PO BID   Hypertension  Resume Cardizem 240 mg PO daily   Diabetes, controlled  Recent A1c 5.8 indicating good glycemic control.  Continue Lantus 8 units at bedtime   GERD (gastroesophageal reflux disease)  Continue protonix   Acute on chronic renal failure (CKD2-3)  Baseline creatinine 0.9-1.2. Creatinine is WNL on this admission.   Elevated point-of-care troponin  Mild elevation in troponin at 0.04. Likely demand ischemia from sepsis.  Has had mild troponin elevation on recent admission. EKG shows t wave inversions in the lateral leads.    Hypothyroidism   Continue Synthroid   DVT prophylaxis  Lovenox subQ ordered.  Radiological Exams on Admission: Ct Maxillofacial W/cm 09/11/2014 Acute parotitis right greater than left without abscess or mass. Chronic sinusitis right  maxillary sinus pain    Code Status: DNR/DNI Family Communication: no family at the bedside  Disposition Plan: pending.   Consultants:  cardiology  Procedures:  none  Antibiotics:  IV vancomycin   IV aztreonam. 1/5  HPI/Subjective: Confused.   Objective: Filed Vitals:   09/11/14 2105  BP: 142/60  Pulse: 77  Temp: 98.2 F (36.8 C)  Resp: 18    Intake/Output Summary (Last 24 hours) at 09/12/14 1054 Last data filed at 09/12/14 0900  Gross per 24 hour  Intake 1121.25 ml  Output      0 ml  Net 1121.25 ml   There were no vitals filed for this visit.  Exam:   General:  Confused, restless   Cardiovascular: s1s2  Respiratory: diminished air entry at bases.   Abdomen:soft bowel sounds heard.   Musculoskeletal: no pedal edema.   Data Reviewed: Basic Metabolic Panel:  Recent Labs Lab 09/11/14 1030 09/12/14 0445  NA 141 138  K 3.7 3.5  CL 99 100  CO2 30 32  GLUCOSE 105* 101*  BUN 21 20  CREATININE 0.75 0.63  CALCIUM 8.5 7.9*   Liver Function Tests:  Recent Labs Lab 09/11/14 1030  AST 29  ALT 31  ALKPHOS 153*  BILITOT 0.4  PROT 5.9*  ALBUMIN 2.4*   No results for input(s): LIPASE, AMYLASE in the last 168 hours. No results for input(s): AMMONIA in the last 168 hours. CBC:  Recent Labs Lab 09/11/14 1030 09/12/14 0445  WBC 26.8* 30.1*  NEUTROABS 22.8*  --   HGB 12.1 10.7*  HCT 37.9 33.1*  MCV 102.2* 101.8*  PLT 263 244   Cardiac Enzymes:  Recent Labs Lab 09/11/14 1030  TROPONINI 0.04*  BNP (last 3 results) No results for input(s): PROBNP in the last 8760 hours. CBG:  Recent Labs Lab 09/11/14 2102 09/12/14 0728  GLUCAP 110* 78    Recent Results (from the past 240 hour(s))  Culture, blood (routine x 2)     Status: None (Preliminary result)   Collection Time: 09/11/14  4:25 PM  Result Value Ref Range Status   Specimen Description Blood  Final   Special Requests NONE  Final   Culture   Final           BLOOD  CULTURE RECEIVED NO GROWTH TO DATE CULTURE WILL BE HELD FOR 5 DAYS BEFORE ISSUING A FINAL NEGATIVE REPORT Performed at [facility]    Report Status PENDING  Incomplete     Studies: Ct Maxillofacial W/cm  09/11/2014   CLINICAL DATA:  Facial swelling  EXAM: CT MAXILLOFACIAL WITH CONTRAST  TECHNIQUE: Multidetector CT imaging of the maxillofacial structures was performed with intravenous contrast. Multiplanar CT image reconstructions were also generated. A small metallic BB was placed on the right temple in order to reliably differentiate right from left.  CONTRAST:  141m OMNIPAQUE IOHEXOL 300 MG/ML  SOLN  COMPARISON:  None.  FINDINGS: Right carotid is enlarged and shows diffuse hyper enhancement consistent with parotitis. No underlying mass or abscess. Left parotid gland shows mild enhancement, with overlying skin edema.  Submandibular gland normal in size and enhancement bilaterally.  Tongue and pharynx normal.  Visualized intracranial contents show no acute abnormality.  Chronic sinusitis right maxillary sinus which is nearly completely opacified with diffuse bony thickening. No acute bony change. Advanced degenerative change in the TMJ bilaterally.  IMPRESSION: Acute parotitis right greater than left without abscess or mass.  Chronic sinusitis right maxillary sinus pain   Electronically Signed   By: CFranchot GalloM.D.   On: 09/11/2014 11:52    Scheduled Meds: . allopurinol  100 mg Oral BID  . aztreonam  1 g Intravenous Q8H  . diltiazem  240 mg Oral Daily  . divalproex  250 mg Oral Daily  . divalproex  500 mg Oral QHS  . enoxaparin (LOVENOX) injection  40 mg Subcutaneous Q24H  . ferrous sulfate  325 mg Oral TID WC  . insulin glargine  8 Units Subcutaneous QHS  . latanoprost  1 drop Both Eyes QHS  . levothyroxine  75 mcg Oral QAC breakfast  . magnesium oxide  400 mg Oral BID  . pantoprazole  40 mg Oral Daily  . psyllium  1 packet Oral QHS  . vancomycin  500 mg Intravenous Q12H    Continuous Infusions: . sodium chloride 75 mL/hr at 09/12/14 07628   Principal Problem:   Parotitis Active Problems:   Gout   Hypertension   Diabetes   GERD (gastroesophageal reflux disease)   Diabetes mellitus   Sepsis    Time spent: 35 minutes.     [facility] Pager [phone] If 7PM-7AM, please contact night-coverage at [email or web address], password [email or web address]1/02/2015, 10:54 AM  LOS: 1 day

## 2014-09-12 NOTE — Progress Notes (Signed)
Patient without urine since this morning, with clear discomfort.  Foley catheter place with 500 cc cloudy urine emptied from bladder.  Kaitlyn Dohenyavid Elisa Kutner RN

## 2014-09-13 ENCOUNTER — Inpatient Hospital Stay (HOSPITAL_COMMUNITY): Payer: Medicare Other

## 2014-09-13 DIAGNOSIS — I059 Rheumatic mitral valve disease, unspecified: Secondary | ICD-10-CM

## 2014-09-13 DIAGNOSIS — E118 Type 2 diabetes mellitus with unspecified complications: Secondary | ICD-10-CM

## 2014-09-13 LAB — CBC
HCT: 32.3 % — ABNORMAL LOW (ref 36.0–46.0)
HEMOGLOBIN: 10.3 g/dL — AB (ref 12.0–15.0)
MCH: 32.6 pg (ref 26.0–34.0)
MCHC: 31.9 g/dL (ref 30.0–36.0)
MCV: 102.2 fL — ABNORMAL HIGH (ref 78.0–100.0)
Platelets: 241 10*3/uL (ref 150–400)
RBC: 3.16 MIL/uL — ABNORMAL LOW (ref 3.87–5.11)
RDW: 15.2 % (ref 11.5–15.5)
WBC: 26.9 10*3/uL — ABNORMAL HIGH (ref 4.0–10.5)

## 2014-09-13 LAB — BASIC METABOLIC PANEL
Anion gap: 4 — ABNORMAL LOW (ref 5–15)
BUN: 19 mg/dL (ref 6–23)
CO2: 33 mmol/L — ABNORMAL HIGH (ref 19–32)
Calcium: 8 mg/dL — ABNORMAL LOW (ref 8.4–10.5)
Chloride: 107 mEq/L (ref 96–112)
Creatinine, Ser: 0.66 mg/dL (ref 0.50–1.10)
GFR calc Af Amer: 89 mL/min — ABNORMAL LOW (ref >90)
GFR calc non Af Amer: 77 mL/min — ABNORMAL LOW (ref >90)
GLUCOSE: 98 mg/dL (ref 70–99)
Potassium: 3.4 mmol/L — ABNORMAL LOW (ref 3.5–5.1)
Sodium: 144 mmol/L (ref 135–145)

## 2014-09-13 LAB — GLUCOSE, CAPILLARY
Glucose-Capillary: 103 mg/dL — ABNORMAL HIGH (ref 70–99)
Glucose-Capillary: 107 mg/dL — ABNORMAL HIGH (ref 70–99)
Glucose-Capillary: 113 mg/dL — ABNORMAL HIGH (ref 70–99)
Glucose-Capillary: 211 mg/dL — ABNORMAL HIGH (ref 70–99)
Glucose-Capillary: 73 mg/dL (ref 70–99)
Glucose-Capillary: 92 mg/dL (ref 70–99)

## 2014-09-13 LAB — URINE CULTURE

## 2014-09-13 MED ORDER — HALOPERIDOL LACTATE 5 MG/ML IJ SOLN
5.0000 mg | Freq: Once | INTRAMUSCULAR | Status: AC
Start: 2014-09-13 — End: 2014-09-13
  Administered 2014-09-13: 5 mg via INTRAMUSCULAR

## 2014-09-13 MED ORDER — HALOPERIDOL LACTATE 5 MG/ML IJ SOLN: 0.5000 mg | Freq: Four times a day (QID) | INTRAMUSCULAR | Status: DC | PRN

## 2014-09-13 MED ORDER — DEXTROSE 50 % IV SOLN
INTRAVENOUS | Status: AC
  Administered 2014-09-13: 50 mL via INTRAVENOUS
  Filled 2014-09-13: qty 50

## 2014-09-13 MED ORDER — DEXTROSE-NACL 5-0.9 % IV SOLN
INTRAVENOUS | Status: DC
Start: 2014-09-13 — End: 2014-09-19
  Administered 2014-09-13 – 2014-09-18 (×8): via INTRAVENOUS

## 2014-09-13 MED ORDER — INSULIN ASPART 100 UNIT/ML ~~LOC~~ SOLN
0.0000 [IU] | SUBCUTANEOUS | Status: DC
  Administered 2014-09-14: 1 [IU] via SUBCUTANEOUS
  Administered 2014-09-15: 2 [IU] via SUBCUTANEOUS
  Administered 2014-09-15: 1 [IU] via SUBCUTANEOUS
  Administered 2014-09-16: 2 [IU] via SUBCUTANEOUS
  Administered 2014-09-16: 1 [IU] via SUBCUTANEOUS
  Administered 2014-09-17: 3 [IU] via SUBCUTANEOUS
  Administered 2014-09-17: 1 [IU] via SUBCUTANEOUS
  Administered 2014-09-18 (×3): 2 [IU] via SUBCUTANEOUS

## 2014-09-13 MED ORDER — METOPROLOL TARTRATE 12.5 MG HALF TABLET
12.5000 mg | ORAL_TABLET | Freq: Two times a day (BID) | ORAL | Status: DC
  Administered 2014-09-16 – 2014-09-20 (×8): 12.5 mg via ORAL
  Filled 2014-09-13 (×16): qty 1

## 2014-09-13 MED ORDER — HALOPERIDOL LACTATE 5 MG/ML IJ SOLN
INTRAMUSCULAR | Status: AC
  Filled 2014-09-13: qty 1

## 2014-09-13 MED ORDER — DEXTROSE 50 % IV SOLN
1.0000 | Freq: Once | INTRAVENOUS | Status: AC
  Administered 2014-09-13: 50 mL via INTRAVENOUS

## 2014-09-13 NOTE — Progress Notes (Signed)
Clinical Social Work  CSW continues to follow for SN[facility]. CSW submitted for SNF PASRR and level A was received. CSW will work with [facility] to verify social security number if son produces card. CSW will continue to follow and can [facility].  [place] Jillianna [place], [place] [phone]

## 2014-09-13 NOTE — Progress Notes (Signed)
Patient transported to MRI at this time via staff.

## 2014-09-13 NOTE — Progress Notes (Signed)
[facility] PROGRESS NOTE  Kaitlyn Burns ZOX:[id number] DOB: 07-Apr-1925 DOA: 09/11/2014 PCP: No primary care provider on file. 81 year old lady recently discharged from the hospital after being treated for, gi bleed,  alcohol withdrawal and acute encephalopathy presents with parotitis, and urinary tract infection. Labs drawn on admission show elevated troponin and EKG shows repolarization changes and t wave inversions in the lateral leads. She is confused and not a great historian.  Assessment/Plan: Sepsis secondary to parotitis and UTI / leukocytosis   Sepsis criteria met on admission with tachycardia, tachypnea, leukocytosis and evidence of infection based on maxillofacial CT and UA.  Maxillofacial CT showed acute parotitis right greater than left without abscess or mass. UA showed large leukocytes and too numerous to count WBC.  Pt started on broad spectrum antibiotics on admission, vanco and aztreonam   Follow up blood cultures, urine culture results  Continue IV fluids for next 24 hours.  Clear liquid diet for now. Advance as tolerated.   Gout:   Resume allopurinol 100 mg PO BID   Hypertension  Resume Cardizem 240 mg PO daily   Diabetes, controlled  Recent A1c 5.8 indicating good glycemic control.  Holding lantus as she is lethargic and not eating.    GERD (gastroesophageal reflux disease)  Continue protonix   Acute on chronic renal failure (CKD2-3)  Baseline creatinine 0.9-1.2. Creatinine is WNL on this admission.   Elevated point-of-care troponin  Mild elevation in troponin at 0.04. Likely demand ischemia from sepsis.  Has had mild troponin elevation on recent admission. EKG shows t wave inversions in the lateral leads.   Cardiology consulted and recommendations given.    Hypothyroidism   Continue Synthroid  Acute encephalopathy: Probably from the sepsis. Able to move all extremities,but lethargic. Getting an MRI of the brain to evaluate  further.    DVT prophylaxis  Lovenox subQ ordered.  Radiological Exams on Admission: Ct Maxillofacial W/cm 09/11/2014 Acute parotitis right greater than left without abscess or mass. Chronic sinusitis right maxillary sinus pain    Code Status: DNR/DNI Family Communication: no family at the bedside ,spoke to daughter in law on 1/6 Disposition Plan: pending.   Consultants:  cardiology  Procedures:  none  Antibiotics:  IV vancomycin   IV aztreonam. 1/5  HPI/Subjective: Confused. Lethargic. Not eating .  Objective: Filed Vitals:   09/13/14 0447  BP: 116/83  Pulse: 91  Temp: 97.6 F (36.4 C)  Resp: 18    Intake/Output Summary (Last 24 hours) at 09/13/14 1309 Last data filed at 09/13/14 0900  Gross per 24 hour  Intake 1701.25 ml  Output    350 ml  Net 1351.25 ml   Filed Weights   09/12/14 1100  Weight: 60 kg (132 lb 4.4 oz)    Exam:   General:  Confused, restless   Cardiovascular: s1s2  Respiratory: diminished air entry at bases.   Abdomen:soft bowel sounds heard.   Musculoskeletal: no pedal edema.   Data Reviewed: Basic Metabolic Panel:  Recent Labs Lab 09/11/14 1030 09/12/14 0445 09/13/14 0940  NA 141 138 144  K 3.7 3.5 3.4*  CL 99 100 107  CO2 30 32 33*  GLUCOSE 105* 101* 98  BUN _0 CREATININE 0.75 0.63 0.66  CALCIUM 8.5 7.9* 8.0*   Liver Function Tests:  Recent Labs Lab 09/11/14 1030  AST 29  ALT 31  ALKPHOS 153*  BILITOT 0.4  PROT 5.9*  ALBUMIN 2.4*   No results for input(s): LIPASE, AMYLASE in the  last 168 hours. No results for input(s): AMMONIA in the last 168 hours. CBC:  Recent Labs Lab 09/11/14 1030 09/12/14 0445 09/13/14 0940  WBC 26.8* 30.1* 26.9*  NEUTROABS 22.8*  --   --   HGB 12.1 10.7* 10.3*  HCT 37.9 33.1* 32.3*  MCV 102.2* 101.8* 102.2*  PLT 263 244 241   Cardiac Enzymes:  Recent Labs Lab 09/11/14 1030 09/12/14 1200 09/12/14 1658  TROPONINI 0.04* 0.04* 0.12*   BNP (last 3  results) No results for input(s): PROBNP in the last 8760 hours. CBG:  Recent Labs Lab 09/11/14 2102 09/12/14 0728 09/12/14 2224 09/13/14 0818  GLUCAP 110* 78 65* 73    Recent Results (from the past 240 hour(s))  Culture, Urine     Status: None (Preliminary result)   Collection Time: 09/11/14  3:15 PM  Result Value Ref Range Status   Specimen Description URINE, CATHETERIZED  Final   Special Requests NONE  Final   Colony Count   Final    >=100,000 COLONIES/ML Performed at [facility]    Culture   Final    ESCHERICHIA COLI Performed at [facility]    Report Status PENDING  Incomplete  Culture, blood (routine x 2)     Status: None (Preliminary result)   Collection Time: 09/11/14  4:25 PM  Result Value Ref Range Status   Specimen Description Blood  Final   Special Requests NONE  Final   Culture   Final           BLOOD CULTURE RECEIVED NO GROWTH TO DATE CULTURE WILL BE HELD FOR 5 DAYS BEFORE ISSUING A FINAL NEGATIVE REPORT Performed at [facility]    Report Status PENDING  Incomplete     Studies: No results found.  Scheduled Meds: . allopurinol  100 mg Oral BID  . aztreonam  1 g Intravenous Q8H  . diltiazem  240 mg Oral Daily  . divalproex  250 mg Oral Daily  . divalproex  500 mg Oral QHS  . enoxaparin (LOVENOX) injection  40 mg Subcutaneous Q24H  . feeding supplement (RESOURCE BREEZE)  1 Container Oral TID BM  . ferrous sulfate  325 mg Oral TID WC  . folic acid  1 mg Oral Daily  . insulin aspart  0-9 Units Subcutaneous 6 times per day  . latanoprost  1 drop Both Eyes QHS  . levothyroxine  75 mcg Oral QAC breakfast  . magnesium oxide  400 mg Oral BID  . metoprolol tartrate  12.5 mg Oral BID  . multivitamin with minerals  1 tablet Oral Daily  . pantoprazole  40 mg Oral Daily  . psyllium  1 packet Oral QHS  . thiamine  100 mg Oral Daily   Or  . thiamine  100 mg Intravenous Daily  . vancomycin  500 mg Intravenous Q12H   Continuous  Infusions: . dextrose 5 % and 0.9% NaCl 75 mL/hr at 09/13/14 0015    Principal Problem:   Parotitis Active Problems:   Gout   Hypertension   Diabetes   GERD (gastroesophageal reflux disease)   Diabetes mellitus   Sepsis   Elevated troponin I level    Time spent: 35 minutes.     [facility] Pager [phone]. If 7PM-7AM, please contact night-coverage at [email or web address], password [facility] 09/13/2014, 1:09 PM  LOS: 2 days

## 2014-09-13 NOTE — Progress Notes (Signed)
Patient unable to take night time PO meds, responds to pain. Was moaning & appeared to be in discomfort, morphine given & was effective. Patient resting comfortably @ this time. Bld. Glucose @ HS was 65, MD notified with order to switch IVF to D5NS & is now infusing.

## 2014-09-13 NOTE — Progress Notes (Signed)
  Echocardiogram 2D Echocardiogram has been performed.  Kaitlyn Burns, Kaitlyn Burns 09/13/2014, 1:53 PM

## 2014-09-13 NOTE — Progress Notes (Signed)
Subjective:  Pt moaning in bed; not able to obtain any additional history  Objective:   Vital Signs in the last 24 hours: Temp:  [97.6 F (36.4 C)-98.6 F (37 C)] 97.6 F (36.4 C) (01/07 0447) Pulse Rate:  [90-99] 91 (01/07 0447) Resp:  [16-18] 18 (01/07 0447) BP: (116-152)/(53-106) 116/83 mmHg (01/07 0447) SpO2:  [92 %-100 %] 100 % (01/07 0447) Weight:  [132 lb 4.4 oz (60 kg)] 132 lb 4.4 oz (60 kg) (01/06 1100)  Intake/Output from previous day: 01/06 0701 - 01/07 0700 In: 1781.3 [P.O.:100; I.V.:1331.3; IV Piggyback:350] Out: 350 [Urine:350] Net: +1431  I/O since admission: +2472  Medications: . allopurinol  100 mg Oral BID  . aztreonam  1 g Intravenous Q8H  . diltiazem  240 mg Oral Daily  . divalproex  250 mg Oral Daily  . divalproex  500 mg Oral QHS  . enoxaparin (LOVENOX) injection  40 mg Subcutaneous Q24H  . feeding supplement (RESOURCE BREEZE)  1 Container Oral TID BM  . ferrous sulfate  325 mg Oral TID WC  . folic acid  1 mg Oral Daily  . insulin glargine  8 Units Subcutaneous QHS  . latanoprost  1 drop Both Eyes QHS  . levothyroxine  75 mcg Oral QAC breakfast  . magnesium oxide  400 mg Oral BID  . multivitamin with minerals  1 tablet Oral Daily  . pantoprazole  40 mg Oral Daily  . psyllium  1 packet Oral QHS  . thiamine  100 mg Oral Daily   Or  . thiamine  100 mg Intravenous Daily  . vancomycin  500 mg Intravenous Q12H    . dextrose 5 % and 0.9% NaCl 75 mL/hr at 09/13/14 0015    Physical Exam:   General appearance: delirious and uncooperative Neck: no JVD, supple, symmetrical, trachea midline and thyroid not enlarged, symmetric, no tenderness/mass/nodules Lungs: decreased BS at bases Heart: regular rate and rhythm Abdomen: soft, non-tender; bowel sounds normal; no masses,  no organomegaly Extremities: no edema, redness or tenderness in the calves or thighs   Rate: 90  Rhythm: normal sinus rhythm  ECG (independently read by me): NSR at 96 with  anterolateral ST changes; increased QTc  Lab Results:  BMP Latest Ref Rng 09/12/2014 09/11/2014 09/02/2014  Glucose 70 - 99 mg/dL 161(W) 960(A) 540(J)  BUN 6 - 23 mg/dL 20 21 5(L)  Creatinine 0.50 - 1.10 mg/dL 8.11 9.14 7.82  Sodium 135 - 145 mmol/L 138 141 141  Potassium 3.5 - 5.1 mmol/L 3.5 3.7 3.6  Chloride 96 - 112 mEq/L 100 99 104  CO2 19 - 32 mmol/L 32 30 31  Calcium 8.4 - 10.5 mg/dL 7.9(L) 8.5 7.5(L)     CBC Latest Ref Rng 09/12/2014 09/11/2014 09/02/2014  WBC 4.0 - 10.5 K/uL 30.1(H) 26.8(H) 10.9(H)  Hemoglobin 12.0 - 15.0 g/dL 10.7(L) 12.1 8.6(L)  Hematocrit 36.0 - 46.0 % 33.1(L) 37.9 26.3(L)  Platelets 150 - 400 K/uL 244 263 180      Recent Labs  09/12/14 1200 09/12/14 1658  TROPONINI 0.04* 0.12*    Hepatic Function Panel  Recent Labs  09/11/14 1030  PROT 5.9*  ALBUMIN 2.4*  AST 29  ALT 31  ALKPHOS 153*  BILITOT 0.4   No results for input(s): INR in the last 72 hours. BNP (last 3 results) No results for input(s): PROBNP in the last 8760 hours.  Lipid Panel  No results found for: CHOL, TRIG, HDL, CHOLHDL, VLDL, LDLCALC, LDLDIRECT    Imaging:  Ct Maxillofacial W/cm  09/11/2014   CLINICAL DATA:  Facial swelling  EXAM: CT MAXILLOFACIAL WITH CONTRAST  TECHNIQUE: Multidetector CT imaging of the maxillofacial structures was performed with intravenous contrast. Multiplanar CT image reconstructions were also generated. A small metallic BB was placed on the right temple in order to reliably differentiate right from left.  CONTRAST:  100mL OMNIPAQUE IOHEXOL 300 MG/ML  SOLN  COMPARISON:  None.  FINDINGS: Right carotid is enlarged and shows diffuse hyper enhancement consistent with parotitis. No underlying mass or abscess. Left parotid gland shows mild enhancement, with overlying skin edema.  Submandibular gland normal in size and enhancement bilaterally.  Tongue and pharynx normal.  Visualized intracranial contents show no acute abnormality.  Chronic sinusitis right  maxillary sinus which is nearly completely opacified with diffuse bony thickening. No acute bony change. Advanced degenerative change in the TMJ bilaterally.  IMPRESSION: Acute parotitis right greater than left without abscess or mass.  Chronic sinusitis right maxillary sinus pain   Electronically Signed   By: Marlan Palauharles  Clark M.D.   On: 09/11/2014 11:52      Assessment/Plan:   Principal Problem:   Parotitis Active Problems:   Gout   Hypertension   Diabetes   GERD (gastroesophageal reflux disease)   Diabetes mellitus   Sepsis   Elevated troponin I level   Echo data is pending. No chest pain. Troponins are midly positive. Medical therapy. Would not anticoagulate. Consider f/u ECG to re-assess changes. Will add low dose BB if able to take oral meds.   Lennette Biharihomas A. Tarris Delbene, MD, [facility] HospitalFACC 09/13/2014, 8:45 AM

## 2014-09-14 DIAGNOSIS — R9431 Abnormal electrocardiogram [ECG] [EKG]: Secondary | ICD-10-CM

## 2014-09-14 LAB — MAGNESIUM: Magnesium: 1.8 mg/dL (ref 1.5–2.5)

## 2014-09-14 LAB — GLUCOSE, CAPILLARY
GLUCOSE-CAPILLARY: 95 mg/dL (ref 70–99)
GLUCOSE-CAPILLARY: 129 mg/dL — AB (ref 70–99)
Glucose-Capillary: 127 mg/dL — ABNORMAL HIGH (ref 70–99)
Glucose-Capillary: 140 mg/dL — ABNORMAL HIGH (ref 70–99)
Glucose-Capillary: 95 mg/dL (ref 70–99)
Glucose-Capillary: 106 mg/dL — ABNORMAL HIGH (ref 70–99)
Glucose-Capillary: 114 mg/dL — ABNORMAL HIGH (ref 70–99)

## 2014-09-14 LAB — CBC
HEMATOCRIT: 34.7 % — AB (ref 36.0–46.0)
Hemoglobin: 10.9 g/dL — ABNORMAL LOW (ref 12.0–15.0)
MCH: 32.1 pg (ref 26.0–34.0)
MCHC: 31.4 g/dL (ref 30.0–36.0)
MCV: 102.1 fL — ABNORMAL HIGH (ref 78.0–100.0)
Platelets: 286 10*3/uL (ref 150–400)
RBC: 3.4 MIL/uL — AB (ref 3.87–5.11)
RDW: 15.5 % (ref 11.5–15.5)
WBC: 22.5 10*3/uL — AB (ref 4.0–10.5)

## 2014-09-14 LAB — BASIC METABOLIC PANEL
ANION GAP: 3 — AB (ref 5–15)
BUN: 14 mg/dL (ref 6–23)
CO2: 29 mmol/L (ref 19–32)
CREATININE: 0.6 mg/dL (ref 0.50–1.10)
Calcium: 7.7 mg/dL — ABNORMAL LOW (ref 8.4–10.5)
Chloride: 104 mEq/L (ref 96–112)
GFR calc Af Amer: >90 mL/min (ref >90)
GFR, EST NON AFRICAN AMERICAN: 80 mL/min — AB (ref >90)
Glucose, Bld: 104 mg/dL — ABNORMAL HIGH (ref 70–99)
POTASSIUM: 3.1 mmol/L — AB (ref 3.5–5.1)
SODIUM: 136 mmol/L (ref 135–145)

## 2014-09-14 LAB — VANCOMYCIN, TROUGH: VANCOMYCIN TR: 20.8 ug/mL — AB (ref 10.0–20.0)

## 2014-09-14 MED ORDER — VANCOMYCIN HCL IN DEXTROSE 750-5 MG/150ML-% IV SOLN
750.0000 mg | INTRAVENOUS | Status: DC
  Administered 2014-09-15 – 2014-09-19 (×5): 750 mg via INTRAVENOUS
  Filled 2014-09-14 (×5): qty 150

## 2014-09-14 MED ORDER — PANTOPRAZOLE SODIUM 40 MG IV SOLR
40.0000 mg | INTRAVENOUS | Status: DC
  Administered 2014-09-14 – 2014-09-20 (×7): 40 mg via INTRAVENOUS
  Filled 2014-09-14 (×7): qty 40

## 2014-09-14 MED ORDER — VALPROATE SODIUM 500 MG/5ML IV SOLN
250.0000 mg | Freq: Every morning | INTRAVENOUS | Status: DC
  Administered 2014-09-14 – 2014-09-20 (×7): 250 mg via INTRAVENOUS
  Filled 2014-09-14 (×8): qty 2.5

## 2014-09-14 MED ORDER — VALPROATE SODIUM 500 MG/5ML IV SOLN
500.0000 mg | Freq: Every day | INTRAVENOUS | Status: DC
  Administered 2014-09-14 – 2014-09-19 (×6): 500 mg via INTRAVENOUS
  Filled 2014-09-14 (×6): qty 5

## 2014-09-14 MED ORDER — MORPHINE SULFATE 2 MG/ML IJ SOLN
2.0000 mg | Freq: Once | INTRAMUSCULAR | Status: AC
  Administered 2014-09-14: 2 mg via INTRAVENOUS
  Filled 2014-09-14: qty 1

## 2014-09-14 MED ORDER — POTASSIUM CHLORIDE 10 MEQ/100ML IV SOLN
10.0000 meq | INTRAVENOUS | Status: AC
  Administered 2014-09-14 (×2): 10 meq via INTRAVENOUS
  Filled 2014-09-14 (×2): qty 100

## 2014-09-14 MED ORDER — HALOPERIDOL LACTATE 5 MG/ML IJ SOLN
2.0000 mg | Freq: Four times a day (QID) | INTRAMUSCULAR | Status: DC | PRN
  Administered 2014-09-14 – 2014-09-20 (×8): 2 mg via INTRAVENOUS
  Filled 2014-09-14 (×9): qty 1

## 2014-09-14 MED ORDER — LEVOTHYROXINE SODIUM 100 MCG IV SOLR
37.5000 ug | Freq: Every day | INTRAVENOUS | Status: DC
  Administered 2014-09-14 – 2014-09-20 (×7): 37.5 ug via INTRAVENOUS
  Filled 2014-09-14 (×7): qty 5

## 2014-09-14 MED ORDER — MORPHINE SULFATE 2 MG/ML IJ SOLN
0.5000 mg | INTRAMUSCULAR | Status: DC | PRN
  Administered 2014-09-14 – 2014-09-19 (×15): 0.5 mg via INTRAVENOUS
  Filled 2014-09-14 (×16): qty 1

## 2014-09-14 NOTE — Progress Notes (Signed)
Clinical Social Work  CSW spoke with attending MD who reports patient is not medically stable and will not be ready to DC until Monday. CSW updated [facility] who reports they can accept patient whenever medically stable. CSW will continue to follow.  OdinHolly Croix Presley, [place] [phone]

## 2014-09-14 NOTE — Progress Notes (Signed)
[facility] PROGRESS NOTE  Kaitlyn Burns UXN:[id number] DOB: [birth year] DOA: 09/11/2014 PCP: No primary care provider on file. 81 year old lady recently discharged from the hospital after being treated for, gi bleed,  alcohol withdrawal and acute encephalopathy presents with parotitis, and urinary tract infection. Labs drawn on admission show elevated troponin and EKG shows repolarization changes and t wave inversions in the lateral leads. She is confused and not a great historian.  Assessment/Plan: Sepsis secondary to parotitis and UTI / leukocytosis   Sepsis criteria met on admission with tachycardia, tachypnea, leukocytosis and evidence of infection based on maxillofacial CT and UA.  Maxillofacial CT showed acute parotitis right greater than left without abscess or mass. UA showed large leukocytes and too numerous to count WBC.  Pt started on broad spectrum antibiotics on admission, vanco and aztreonam   Follow up blood cultures, urine culture  Show E COLI SENSITIVE   Continue IV fluids for next 24 hours. As she is not eating.    Gout:   HOLDING allopurinol as she is no ttaking anything po.    Hypertension  Optimal off meds.    Diabetes, controlled  Recent A1c 5.8 indicating good glycemic control.  Holding lantus as she is lethargic and not eating.    GERD (gastroesophageal reflux disease)  Continue protonix   Acute on chronic renal failure (CKD2-3)  Baseline creatinine 0.9-1.2. Creatinine is WNL on this admission.   Elevated point-of-care troponin  Mild elevation in troponin at 0.04. Likely demand ischemia from sepsis.  Has had mild troponin elevation on recent admission. EKG shows t wave inversions in the lateral leads.   Cardiology consulted and recommendations given.    Hypothyroidism   Continue Synthroid  Acute encephalopathy: Probably from the sepsis. Able to move all extremities,but lethargic. MRI of the brain obtained. Did not show any  ischemic changes though the exam was not optimal. Discussed the results with the patient's son.   Left upper extremity swelling and redness: Probably superficial thrombophlebitis. Getting an US dopplers to evaluate for DVT.    DVT prophylaxis  Lovenox subQ ordered.  Radiological Exams on Admission: Ct Maxillofacial W/cm 09/11/2014 Acute parotitis right greater than left without abscess or mass. Chronic sinusitis right maxillary sinus pain    Code Status: DNR/DNI Family Communication: no family at the bedside ,spoke to [facility] ON 1/8  Disposition Plan: pending.   Consultants:  cardiology  Procedures:  none  Antibiotics:  IV vancomycin   IV aztreonam. 1/5  HPI/Subjective: Confused. Lethargic. Not eating .  Objective: Filed Vitals:   09/14/14 1414  BP: 112/60  Pulse: 92  Temp: 98.2 F (36.8 C)  Resp: 15    Intake/Output Summary (Last 24 hours) at 09/14/14 1805 Last data filed at 09/14/14 0855  Gross per 24 hour  Intake      0 ml  Output    550 ml  Net   -550 ml   Filed Weights   09/12/14 1100  Weight: 60 kg (132 lb 4.4 oz)    Exam:   General:  Confused, restless   Cardiovascular: s1s2  Respiratory: diminished air entry at bases.   Abdomen:soft bowel sounds heard.   Musculoskeletal: no pedal edema.   Data Reviewed: Basic Metabolic Panel:  Recent Labs Lab 09/11/14 1030 09/12/14 0445 09/13/14 0940 09/14/14 0810  NA 141 138 144 136  K 3.7 3.5 3.4* 3.1*  CL 99 100 107 104  CO2 30 32 33* 29  GLUCOSE 105* 101* 98 104*  BUN 21  20 19 14   CREATININE 0.75 0.63 0.66 0.60  CALCIUM 8.5 7.9* 8.0* 7.7*  MG  --   --   --  1.8   Liver Function Tests:  Recent Labs Lab 09/11/14 1030  AST 29  ALT 31  ALKPHOS 153*  BILITOT 0.4  PROT 5.9*  ALBUMIN 2.4*   No results for input(s): LIPASE, AMYLASE in the last 168 hours. No results for input(s): AMMONIA in the last 168 hours. CBC:  Recent Labs Lab 09/11/14 1030 09/12/14 0445 09/13/14 0940  09/14/14 0810  WBC 26.8* 30.1* 26.9* 22.5*  NEUTROABS 22.8*  --   --   --   HGB 12.1 10.7* 10.3* 10.9*  HCT 37.9 33.1* 32.3* 34.7*  MCV 102.2* 101.8* 102.2* 102.1*  PLT 263 244 241 286   Cardiac Enzymes:  Recent Labs Lab 09/11/14 1030 09/12/14 1200 09/12/14 1658  TROPONINI 0.04* 0.04* 0.12*   BNP (last 3 results) No results for input(s): PROBNP in the last 8760 hours. CBG:  Recent Labs Lab 09/14/14 0328 09/14/14 0358 09/14/14 0739 09/14/14 1209 09/14/14 1556  GLUCAP 127* 140* 95 95 106*    Recent Results (from the past 240 hour(s))  Culture, Urine     Status: None   Collection Time: 09/11/14  3:15 PM  Result Value Ref Range Status   Specimen Description URINE, CATHETERIZED  Final   Special Requests NONE  Final   Colony Count   Final    >=100,000 COLONIES/ML Performed at [facility]    Culture   Final    ESCHERICHIA COLI Performed at [facility]    Report Status 09/13/2014 FINAL  Final   Organism ID, Bacteria ESCHERICHIA COLI  Final      Susceptibility   Escherichia coli - MIC*    AMPICILLIN >=32 RESISTANT Resistant     CEFAZOLIN <=4 SENSITIVE Sensitive     CEFTRIAXONE <=1 SENSITIVE Sensitive     CIPROFLOXACIN >=4 RESISTANT Resistant     GENTAMICIN <=1 SENSITIVE Sensitive     LEVOFLOXACIN >=8 RESISTANT Resistant     NITROFURANTOIN <=16 SENSITIVE Sensitive     TOBRAMYCIN <=1 SENSITIVE Sensitive     TRIMETH/SULFA <=20 SENSITIVE Sensitive     PIP/TAZO 8 SENSITIVE Sensitive     * ESCHERICHIA COLI  Culture, blood (routine x 2)     Status: None (Preliminary result)   Collection Time: 09/11/14  4:25 PM  Result Value Ref Range Status   Specimen Description Blood  Final   Special Requests NONE  Final   Culture   Final           BLOOD CULTURE RECEIVED NO GROWTH TO DATE CULTURE WILL BE HELD FOR 5 DAYS BEFORE ISSUING A FINAL NEGATIVE REPORT Performed at [facility]    Report Status PENDING  Incomplete     Studies: Mr Brain Wo  Contrast  09/13/2014   CLINICAL DATA:  Acute encephalopathy, dementia. Acute parotiditis and sepsis.  EXAM: MRI HEAD WITHOUT CONTRAST  TECHNIQUE: Multiplanar, multiecho pulse sequences of the brain and surrounding structures were obtained without intravenous contrast. Due to reported confusion and difficulty tolerating the examination, coronal T2, axial T1 and gradient sequences not obtained.  COMPARISON:  CT of the neck September 11, 2014 and CT of the head August 26, 2014  FINDINGS: Moderately motion degraded examination. No reduced diffusion to suggest acute ischemia. Moderate ventriculomegaly, likely on the basis of global parenchymal brain volume loss as there is overall commensurate enlargement of cerebral sulci and cerebellar  folia, within normal range for patient's age. No midline shift or mass effect. Moderate suspected white matter changes suggest chronic small vessel ischemic disease.  No abnormal sellar expansion. No cerebellar tonsillar ectopia. Mild no definite bone marrow signal abnormality. Patient appears edentulous.  No abnormal extra-axial fluid collections. Normal major intracranial vascular flow voids seen at the skull base. Trace bilateral mastoid effusions. Complete opacification RIGHT maxillary sinus, in a background of mild paranasal sinus mucosal thickening.  IMPRESSION: Moderately motion degraded examination, incomplete examination due to inability to tolerate further imaging. If clinically indicated, patient could return when better able tolerate further imaging, and addendum would be made to this report.  No acute ischemia.  Involutional changes. Moderate suspected white matter changes suggest chronic small vessel ischemic disease.  Paranasal sinusitis, better demonstrated on prior head CT. Trace bilateral mastoid effusions.   Electronically Signed   By: Elon Alas   On: 09/13/2014 21:31    Scheduled Meds: . aztreonam  1 g Intravenous Q8H  . diltiazem  240 mg Oral Daily  .  enoxaparin (LOVENOX) injection  40 mg Subcutaneous Q24H  . feeding supplement (RESOURCE BREEZE)  1 Container Oral TID BM  . ferrous sulfate  325 mg Oral TID WC  . folic acid  1 mg Oral Daily  . insulin aspart  0-9 Units Subcutaneous 6 times per day  . latanoprost  1 drop Both Eyes QHS  . levothyroxine  37.5 mcg Intravenous Daily  . metoprolol tartrate  12.5 mg Oral BID  . multivitamin with minerals  1 tablet Oral Daily  . pantoprazole (PROTONIX) IV  40 mg Intravenous Q24H  . psyllium  1 packet Oral QHS  . thiamine  100 mg Oral Daily   Or  . thiamine  100 mg Intravenous Daily  . valproate sodium  250 mg Intravenous q morning - 10a  . valproate sodium  500 mg Intravenous Q2200  . [START ON 09/15/2014] vancomycin  750 mg Intravenous Q24H   Continuous Infusions: . dextrose 5 % and 0.9% NaCl 75 mL/hr at 09/13/14 1556    Principal Problem:   Parotitis Active Problems:   Gout   Hypertension   Diabetes   GERD (gastroesophageal reflux disease)   Diabetes mellitus   Sepsis   Elevated troponin I level    Time spent: 35 minutes.     [facility] Pager [phone]. If 7PM-7AM, please contact night-coverage at [email or web address], password [facility] 09/14/2014, 6:05 PM  LOS: 3 days

## 2014-09-14 NOTE — Progress Notes (Signed)
ANTIBIOTIC CONSULT NOTE  Pharmacy Consult for vancomycin, aztreonam Indication: sepsis secondary to acute parotitis, UTI  Allergies  Allergen Reactions  . Ciprofloxacin   . Gabapentin     unknown  . Lorazepam     unknown  . Metoclopramide     unknown  . Penicillins     unknown  . Tape     unknown  . Teflaro [Ceftaroline]     unknown    Patient Measurements: Height: 5\' 5"  (165.1 cm) Weight: 132 lb 4.4 oz (60 kg) IBW/kg (Calculated) : 57   Vital Signs: Temp: 98 F (36.7 C) (01/08 0630) Temp Source: Axillary (01/08 0630) BP: 117/55 mmHg (01/08 0630) Pulse Rate: 92 (01/08 0630) Intake/Output from previous day: 01/07 0701 - 01/08 0700 In: 0  Out: 550 [Urine:550] Intake/Output from this shift:    Labs:  Recent Labs  09/12/14 0445 09/13/14 0940 09/14/14 0810  WBC 30.1* 26.9* 22.5*  HGB 10.7* 10.3* 10.9*  PLT 244 241 286  CREATININE 0.63 0.66 0.60   Estimated Creatinine Clearance: 44.6 mL/min (by C-G formula based on Cr of 0.6).  Recent Labs  09/14/14 0810  VANCOTROUGH 20.8*     Medical History: Past Medical History  Diagnosis Date  . Diabetes mellitus   . Thyroid disease   . Complication of anesthesia     trouble intubating surgery  . Difficult intubation   . Arthritis   . UTI (lower urinary tract infection)    Microbiology: 1/5 blood: NGTD 1/5 urine: >100K E.Coli, pan-susceptible  Anti-infectives: 1/5 >> Vanc >> 1/5 >> Aztreonam >>  Assessment: 81yo F from NH w/ bilateral neck swelling and pain and strong smelling urine. Recent admission for encephalopathy and hypotension. Pharmacy was asked to dose vancomycin and aztreonam for sepsis in setting of soft tissue infection and UTI.  Today, 09/14/14: D#4 vancomycin 500mg  IV q12h (after 1g loading dose) and aztreonam 1g IV q8h Remains afebrile  WBC still elevated but improving SCr stable, WNL Vancomycin trough slightly above goal range.  Anticipate further accumulation may occur unless dosage  is reduced. Aztreonam dosage remains appropriate.  Goal of Therapy:  Vancomycin trough level 15-20 mcg/ml  Appropriate antibiotic dosing for indication and renal function; eradication of infection.   Plan:  1. Hold vancomycin dose this AM (discussed with RN earlier) 2. Reduce vancomycin to 750 mg IV q24h, next dose tomorrow at 0800 3. Continue current aztreonam dosage (1g IV q8h) 4. Follow renal function, culture results, clinical course. 5. Await further word on planned duration of therapy.  Elie Goodyandy Nyanna Heideman, PharmD, BCPS Pager: [phone] 09/14/2014  10:14 AM

## 2014-09-14 NOTE — Progress Notes (Addendum)
Subjective:  No longer moaning; but still not able to obtain any additional history  Objective:   Vital Signs in the last 24 hours: Temp:  [97.8 F (36.6 C)-98 F (36.7 C)] 98 F (36.7 C) (01/08 0630) Pulse Rate:  [90-104] 92 (01/08 0630) Resp:  [14-20] 20 (01/08 0630) BP: (114-144)/(39-69) 117/55 mmHg (01/08 0630) SpO2:  [84 %-95 %] 93 % (01/07 2102)  Intake/Output from previous day: 01/07 0701 - 01/08 0700 In: 0  Out: 550 [Urine:550] Net: -550  I/O since admission: +1922  Medications: . allopurinol  100 mg Oral BID  . aztreonam  1 g Intravenous Q8H  . diltiazem  240 mg Oral Daily  . divalproex  250 mg Oral Daily  . divalproex  500 mg Oral QHS  . enoxaparin (LOVENOX) injection  40 mg Subcutaneous Q24H  . feeding supplement (RESOURCE BREEZE)  1 Container Oral TID BM  . ferrous sulfate  325 mg Oral TID WC  . folic acid  1 mg Oral Daily  . insulin aspart  0-9 Units Subcutaneous 6 times per day  . latanoprost  1 drop Both Eyes QHS  . levothyroxine  75 mcg Oral QAC breakfast  . magnesium oxide  400 mg Oral BID  . metoprolol tartrate  12.5 mg Oral BID  . multivitamin with minerals  1 tablet Oral Daily  . pantoprazole  40 mg Oral Daily  . psyllium  1 packet Oral QHS  . thiamine  100 mg Oral Daily   Or  . thiamine  100 mg Intravenous Daily  . vancomycin  500 mg Intravenous Q12H    . dextrose 5 % and 0.9% NaCl 75 mL/hr at 09/13/14 1556    Physical Exam:   General appearance: delirious and uncooperative Neck: no JVD, supple, symmetrical, trachea midline and thyroid not enlarged, symmetric, no tenderness/mass/nodules Lungs: decreased BS at bases Heart: regular rate and rhythm Abdomen: soft, non-tender; bowel sounds normal; no masses,  no organomegaly Extremities: no edema, redness or tenderness in the calves or thighs   Rate: 90  Rhythm: normal sinus rhythm  ECG (independently read by me): NSR at 96 with anterolateral ST changes; increased QTc  ECG  (independently read by me):  NSR at 89; anterolateral ST changes  Lab Results:  BMP Latest Ref Rng 09/13/2014 09/12/2014 09/11/2014  Glucose 70 - 99 mg/dL 98 161(W) 960(A)  BUN 6 - 23 mg/dL Creatinine 0.50 - 1.10 mg/dL 5.40 9.81 1.91  Sodium 135 - 145 mmol/L 144 138 141  Potassium 3.5 - 5.1 mmol/L 3.4(L) 3.5 3.7  Chloride 96 - 112 mEq/L 107 100 99  CO2 19 - 32 mmol/L 33(H) 32 30  Calcium 8.4 - 10.5 mg/dL 8.0(L) 7.9(L) 8.5     CBC Latest Ref Rng 09/13/2014 09/12/2014 09/11/2014  WBC 4.0 - 10.5 K/uL 26.9(H) 30.1(H) 26.8(H)  Hemoglobin 12.0 - 15.0 g/dL 10.3(L) 10.7(L) 12.1  Hematocrit 36.0 - 46.0 % 32.3(L) 33.1(L) 37.9  Platelets 150 - 400 K/uL 241 244 263      Recent Labs  09/12/14 1200 09/12/14 1658  TROPONINI 0.04* 0.12*    Hepatic Function Panel  Recent Labs  09/11/14 1030  PROT 5.9*  ALBUMIN 2.4*  AST 29  ALT 31  ALKPHOS 153*  BILITOT 0.4   No results for input(s): INR in the last 72 hours. BNP (last 3 results) No results for input(s): PROBNP in the last 8760 hours.  Lipid Panel  No results found for: CHOL, TRIG, HDL, CHOLHDL,  VLDL, [facility], LDLDIRECT    Imaging:  Mr Brain Wo Contrast  09/13/2014   CLINICAL DATA:  Acute encephalopathy, dementia. Acute parotiditis and sepsis.  EXAM: MRI HEAD WITHOUT CONTRAST  TECHNIQUE: Multiplanar, multiecho pulse sequences of the brain and surrounding structures were obtained without intravenous contrast. Due to reported confusion and difficulty tolerating the examination, coronal T2, axial T1 and gradient sequences not obtained.  COMPARISON:  CT of the neck September 11, 2014 and CT of the head August 26, 2014  FINDINGS: Moderately motion degraded examination. No reduced diffusion to suggest acute ischemia. Moderate ventriculomegaly, likely on the basis of global parenchymal brain volume loss as there is overall commensurate enlargement of cerebral sulci and cerebellar folia, within normal range for patient's age. No midline  shift or mass effect. Moderate suspected white matter changes suggest chronic small vessel ischemic disease.  No abnormal sellar expansion. No cerebellar tonsillar ectopia. Mild no definite bone marrow signal abnormality. Patient appears edentulous.  No abnormal extra-axial fluid collections. Normal major intracranial vascular flow voids seen at the skull base. Trace bilateral mastoid effusions. Complete opacification RIGHT maxillary sinus, in a background of mild paranasal sinus mucosal thickening.  IMPRESSION: Moderately motion degraded examination, incomplete examination due to inability to tolerate further imaging. If clinically indicated, patient could return when better able tolerate further imaging, and addendum would be made to this report.  No acute ischemia.  Involutional changes. Moderate suspected white matter changes suggest chronic small vessel ischemic disease.  Paranasal sinusitis, better demonstrated on prior head CT. Trace bilateral mastoid effusions.   Electronically Signed   By: Awilda Metroourtnay  Bloomer   On: 09/13/2014 21:31    ------------------------------------------------------------------- ECHO Study Conclusions  - Left ventricle: The cavity size was normal. There was mild focal basal hypertrophy of the septum. Systolic function was normal. The estimated ejection fraction was in the range of 55% to 60%. Wall motion was normal; there were no regional wall motion abnormalities. Doppler parameters are consistent with abnormal left ventricular relaxation (grade 1 diastolic dysfunction). Doppler parameters are consistent with high ventricular filling pressure. - Mitral valve: Moderately calcified annulus. There was moderate regurgitation. - Left atrium: The atrium was mildly dilated. - Pulmonary arteries: Systolic pressure was moderately increased. PA peak pressure: 56 mm Hg (S).  Assessment/Plan:   Principal Problem:   Parotitis Active Problems:   Gout    Hypertension   Diabetes   GERD (gastroesophageal reflux disease)   Diabetes mellitus   Sepsis   Elevated troponin I level   Echo reveals normal systolic function without wall motion abnormalities.  No chest pain. Troponins are midly positive. Medical therapy. Would not anticoagulate.  Continue low dose BB if able to take oral meds. K is 3.4; replete to 4.0. We will sign off.   Lennette Biharihomas A. Bernadetta Roell, MD, [facility] HospitalFACC 09/14/2014, 8:15 AM

## 2014-09-15 LAB — GLUCOSE, CAPILLARY
GLUCOSE-CAPILLARY: 122 mg/dL — AB (ref 70–99)
GLUCOSE-CAPILLARY: 82 mg/dL (ref 70–99)
Glucose-Capillary: 75 mg/dL (ref 70–99)
Glucose-Capillary: 62 mg/dL — ABNORMAL LOW (ref 70–99)
Glucose-Capillary: 102 mg/dL — ABNORMAL HIGH (ref 70–99)

## 2014-09-15 LAB — BASIC METABOLIC PANEL
Anion gap: 7 (ref 5–15)
BUN: 10 mg/dL (ref 6–23)
CO2: 31 mmol/L (ref 19–32)
CREATININE: 0.61 mg/dL (ref 0.50–1.10)
Calcium: 8.2 mg/dL — ABNORMAL LOW (ref 8.4–10.5)
Chloride: 107 mEq/L (ref 96–112)
GFR, EST NON AFRICAN AMERICAN: 79 mL/min — AB (ref >90)
Glucose, Bld: 74 mg/dL (ref 70–99)
Potassium: 3.5 mmol/L (ref 3.5–5.1)
SODIUM: 145 mmol/L (ref 135–145)

## 2014-09-15 MED ORDER — DEXTROSE 50 % IV SOLN
INTRAVENOUS | Status: AC
  Administered 2014-09-15: 50 mL
  Filled 2014-09-15: qty 50

## 2014-09-15 NOTE — Progress Notes (Signed)
Attempted left upper extremity venous duplex. The patient is contracted and uncooperative, therefore I was unable to complete the exam. Please notify us if she becomes more cooperative.  09/15/2014 10:13 AM  Gertie FeyMichelle Brianca Fortenberry, RVT, RDCS, RDMS

## 2014-09-15 NOTE — Progress Notes (Signed)
[facility] PROGRESS NOTE  Kaitlyn Burns NLZ:[id number] DOB: 27-Mar-1925 DOA: 09/11/2014 PCP: No primary care provider on file. 81 year old lady recently discharged from the hospital after being treated for, gi bleed,  alcohol withdrawal and acute encephalopathy presents with parotitis, and urinary tract infection. Labs drawn on admission show elevated troponin and EKG shows repolarization changes and t wave inversions in the lateral leads. She is confused and not a great historian.  Assessment/Plan: Sepsis secondary to parotitis and UTI / leukocytosis   Sepsis criteria met on admission with tachycardia, tachypnea, leukocytosis and evidence of infection based on maxillofacial CT and UA.  Maxillofacial CT showed acute parotitis right greater than left without abscess or mass. UA showed large leukocytes and too numerous to count WBC.  Pt started on broad spectrum antibiotics on admission, vanco and aztreonam   Follow up blood cultures, urine culture  Show E COLI SENSITIVE   Continue dextrose fluids as she is not eating.     Gout:   HOLDING allopurinol as she is no ttaking anything po.    Hypertension  Optimal off meds.    Diabetes, controlled  Recent A1c 5.8 indicating good glycemic control.  Holding lantus as she is lethargic and not eating.    GERD (gastroesophageal reflux disease)  Continue protonix   Acute on chronic renal failure (CKD2-3)  Baseline creatinine 0.9-1.2. Creatinine is WNL on this admission.   Elevated point-of-care troponin  Mild elevation in troponin at 0.04. Likely demand ischemia from sepsis.  Has had mild troponin elevation on recent admission. EKG shows t wave inversions in the lateral leads.   Cardiology consulted and recommendations given.    Hypothyroidism   Continue Synthroid  Acute encephalopathy: Probably from the sepsis. Able to move all extremities,but lethargic. MRI of the brain obtained. Did not show any ischemic changes  though the exam was not optimal. Discussed the results with the patient's son.   Left upper extremity swelling and redness: Probably superficial thrombophlebitis. Getting an US dopplers to evaluate for DVT.    DVT prophylaxis  Lovenox subQ ordered.  Radiological Exams on Admission: Ct Maxillofacial W/cm 09/11/2014 Acute parotitis right greater than left without abscess or mass. Chronic sinusitis right maxillary sinus pain    Code Status: DNR/DNI Family Communication: no family at the bedside ,spoke to [facility] ON 1/8  Disposition Plan: pending.   Consultants:  cardiology  Procedures:  none  Antibiotics:  IV vancomycin   IV aztreonam. 1/5  HPI/Subjective: Confused. Lethargic. Not eating .  Objective: Filed Vitals:   09/15/14 1520  BP: 129/38  Pulse: 78  Temp: 98.1 F (36.7 C)  Resp: 18    Intake/Output Summary (Last 24 hours) at 09/15/14 1824 Last data filed at 09/15/14 1622  Gross per 24 hour  Intake    900 ml  Output   1450 ml  Net   -550 ml   Filed Weights   09/12/14 1100  Weight: 60 kg (132 lb 4.4 oz)    Exam:   General:  Confused, restless   Cardiovascular: s1s2  Respiratory: diminished air entry at bases.   Abdomen:soft bowel sounds heard.   Musculoskeletal: no pedal edema.   Data Reviewed: Basic Metabolic Panel:  Recent Labs Lab 09/11/14 1030 09/12/14 0445 09/13/14 0940 09/14/14 0810 09/15/14 0606  NA 141 138 144 136 145  K 3.7 3.5 3.4* 3.1* 3.5  CL 99 100 107 104 107  CO2 30 32 33* 29 31  GLUCOSE 105* 101* 98 104* 74  BUN  21 20 19 14 10   CREATININE 0.75 0.63 0.66 0.60 0.61  CALCIUM 8.5 7.9* 8.0* 7.7* 8.2*  MG  --   --   --  1.8  --    Liver Function Tests:  Recent Labs Lab 09/11/14 1030  AST 29  ALT 31  ALKPHOS 153*  BILITOT 0.4  PROT 5.9*  ALBUMIN 2.4*   No results for input(s): LIPASE, AMYLASE in the last 168 hours. No results for input(s): AMMONIA in the last 168 hours. CBC:  Recent Labs Lab  09/11/14 1030 09/12/14 0445 09/13/14 0940 09/14/14 0810  WBC 26.8* 30.1* 26.9* 22.5*  NEUTROABS 22.8*  --   --   --   HGB 12.1 10.7* 10.3* 10.9*  HCT 37.9 33.1* 32.3* 34.7*  MCV 102.2* 101.8* 102.2* 102.1*  PLT 263 244 241 286   Cardiac Enzymes:  Recent Labs Lab 09/11/14 1030 09/12/14 1200 09/12/14 1658  TROPONINI 0.04* 0.04* 0.12*   BNP (last 3 results) No results for input(s): PROBNP in the last 8760 hours. CBG:  Recent Labs Lab 09/15/14 0006 09/15/14 0402 09/15/14 0731 09/15/14 1146 09/15/14 1649  GLUCAP 122* 75 62* 82 102*    Recent Results (from the past 240 hour(s))  Culture, Urine     Status: None   Collection Time: 09/11/14  3:15 PM  Result Value Ref Range Status   Specimen Description URINE, CATHETERIZED  Final   Special Requests NONE  Final   Colony Count   Final    >=100,000 COLONIES/ML Performed at [facility]    Culture   Final    ESCHERICHIA COLI Performed at [facility]    Report Status 09/13/2014 FINAL  Final   Organism ID, Bacteria ESCHERICHIA COLI  Final      Susceptibility   Escherichia coli - MIC*    AMPICILLIN >=32 RESISTANT Resistant     CEFAZOLIN <=4 SENSITIVE Sensitive     CEFTRIAXONE <=1 SENSITIVE Sensitive     CIPROFLOXACIN >=4 RESISTANT Resistant     GENTAMICIN <=1 SENSITIVE Sensitive     LEVOFLOXACIN >=8 RESISTANT Resistant     NITROFURANTOIN <=16 SENSITIVE Sensitive     TOBRAMYCIN <=1 SENSITIVE Sensitive     TRIMETH/SULFA <=20 SENSITIVE Sensitive     PIP/TAZO 8 SENSITIVE Sensitive     * ESCHERICHIA COLI  Culture, blood (routine x 2)     Status: None (Preliminary result)   Collection Time: 09/11/14  4:25 PM  Result Value Ref Range Status   Specimen Description Blood  Final   Special Requests NONE  Final   Culture   Final           BLOOD CULTURE RECEIVED NO GROWTH TO DATE CULTURE WILL BE HELD FOR 5 DAYS BEFORE ISSUING A FINAL NEGATIVE REPORT Performed at [facility]    Report Status PENDING   Incomplete     Studies: Mr Brain Wo Contrast  09/13/2014   CLINICAL DATA:  Acute encephalopathy, dementia. Acute parotiditis and sepsis.  EXAM: MRI HEAD WITHOUT CONTRAST  TECHNIQUE: Multiplanar, multiecho pulse sequences of the brain and surrounding structures were obtained without intravenous contrast. Due to reported confusion and difficulty tolerating the examination, coronal T2, axial T1 and gradient sequences not obtained.  COMPARISON:  CT of the neck September 11, 2014 and CT of the head August 26, 2014  FINDINGS: Moderately motion degraded examination. No reduced diffusion to suggest acute ischemia. Moderate ventriculomegaly, likely on the basis of global parenchymal brain volume loss as there is overall  commensurate enlargement of cerebral sulci and cerebellar folia, within normal range for patient's age. No midline shift or mass effect. Moderate suspected white matter changes suggest chronic small vessel ischemic disease.  No abnormal sellar expansion. No cerebellar tonsillar ectopia. Mild no definite bone marrow signal abnormality. Patient appears edentulous.  No abnormal extra-axial fluid collections. Normal major intracranial vascular flow voids seen at the skull base. Trace bilateral mastoid effusions. Complete opacification RIGHT maxillary sinus, in a background of mild paranasal sinus mucosal thickening.  IMPRESSION: Moderately motion degraded examination, incomplete examination due to inability to tolerate further imaging. If clinically indicated, patient could return when better able tolerate further imaging, and addendum would be made to this report.  No acute ischemia.  Involutional changes. Moderate suspected white matter changes suggest chronic small vessel ischemic disease.  Paranasal sinusitis, better demonstrated on prior head CT. Trace bilateral mastoid effusions.   Electronically Signed   By: Elon Alas   On: 09/13/2014 21:31    Scheduled Meds: . aztreonam  1 g Intravenous Q8H   . diltiazem  240 mg Oral Daily  . enoxaparin (LOVENOX) injection  40 mg Subcutaneous Q24H  . feeding supplement (RESOURCE BREEZE)  1 Container Oral TID BM  . ferrous sulfate  325 mg Oral TID WC  . folic acid  1 mg Oral Daily  . insulin aspart  0-9 Units Subcutaneous 6 times per day  . latanoprost  1 drop Both Eyes QHS  . levothyroxine  37.5 mcg Intravenous Daily  . metoprolol tartrate  12.5 mg Oral BID  . multivitamin with minerals  1 tablet Oral Daily  . pantoprazole (PROTONIX) IV  40 mg Intravenous Q24H  . psyllium  1 packet Oral QHS  . thiamine  100 mg Oral Daily   Or  . thiamine  100 mg Intravenous Daily  . valproate sodium  250 mg Intravenous q morning - 10a  . valproate sodium  500 mg Intravenous Q2200  . vancomycin  750 mg Intravenous Q24H   Continuous Infusions: . dextrose 5 % and 0.9% NaCl 75 mL/hr at 09/15/14 1634    Principal Problem:   Parotitis Active Problems:   Gout   Hypertension   Diabetes   GERD (gastroesophageal reflux disease)   Diabetes mellitus   Sepsis   Elevated troponin I level    Time spent: 35 minutes.     [place] [facility] Pager [phone]. If 7PM-7AM, please contact night-coverage at [email or web address], password [facility] 09/15/2014, 6:24 PM  LOS: 4 days

## 2014-09-15 NOTE — Evaluation (Signed)
  SLP Cancellation Note  Patient Details Name: Kaitlyn GheeRobye Burns MRN: [id number] DOB: 08/07/1925   Cancelled treatment:       Reason Eval/Treat Not Completed: Patient's level of consciousness  Pt did not awaken adequately for po intake with gentle sternal rub.  Per RN, pt with coughing while attempting po intake yesterday.    Mickie BailKimball, Jacquel Redditt Ann Wendolyn Raso RuidosoKimball, TennesseeMS [facility]iseCCC SLP [phone]

## 2014-09-16 DIAGNOSIS — A419 Sepsis, unspecified organism: Principal | ICD-10-CM

## 2014-09-16 LAB — BASIC METABOLIC PANEL
Anion gap: 7 (ref 5–15)
BUN: 7 mg/dL (ref 6–23)
CO2: 28 mmol/L (ref 19–32)
Calcium: 7.7 mg/dL — ABNORMAL LOW (ref 8.4–10.5)
Chloride: 103 mEq/L (ref 96–112)
Creatinine, Ser: 0.53 mg/dL (ref 0.50–1.10)
GFR calc Af Amer: >90 mL/min (ref >90)
GFR calc non Af Amer: 83 mL/min — ABNORMAL LOW (ref >90)
Glucose, Bld: 126 mg/dL — ABNORMAL HIGH (ref 70–99)
Potassium: 3.2 mmol/L — ABNORMAL LOW (ref 3.5–5.1)
Sodium: 138 mmol/L (ref 135–145)

## 2014-09-16 LAB — GLUCOSE, CAPILLARY
GLUCOSE-CAPILLARY: 115 mg/dL — AB (ref 70–99)
GLUCOSE-CAPILLARY: 150 mg/dL — AB (ref 70–99)
Glucose-Capillary: 103 mg/dL — ABNORMAL HIGH (ref 70–99)
Glucose-Capillary: 106 mg/dL — ABNORMAL HIGH (ref 70–99)
Glucose-Capillary: 120 mg/dL — ABNORMAL HIGH (ref 70–99)
Glucose-Capillary: 139 mg/dL — ABNORMAL HIGH (ref 70–99)
Glucose-Capillary: 167 mg/dL — ABNORMAL HIGH (ref 70–99)

## 2014-09-16 LAB — CBC
HCT: 34 % — ABNORMAL LOW (ref 36.0–46.0)
Hemoglobin: 10.7 g/dL — ABNORMAL LOW (ref 12.0–15.0)
MCH: 31.7 pg (ref 26.0–34.0)
MCHC: 31.5 g/dL (ref 30.0–36.0)
MCV: 100.6 fL — ABNORMAL HIGH (ref 78.0–100.0)
Platelets: 304 10*3/uL (ref 150–400)
RBC: 3.38 MIL/uL — ABNORMAL LOW (ref 3.87–5.11)
RDW: 14.8 % (ref 11.5–15.5)
WBC: 20.5 10*3/uL — ABNORMAL HIGH (ref 4.0–10.5)

## 2014-09-16 MED ORDER — TRAMADOL-ACETAMINOPHEN 37.5-325 MG PO TABS
2.0000 | ORAL_TABLET | Freq: Once | ORAL | Status: AC
  Administered 2014-09-16: 2 via ORAL
  Filled 2014-09-16: qty 2

## 2014-09-16 MED ORDER — POTASSIUM CHLORIDE CRYS ER 20 MEQ PO TBCR
40.0000 meq | EXTENDED_RELEASE_TABLET | Freq: Once | ORAL | Status: AC
  Administered 2014-09-16: 40 meq via ORAL
  Filled 2014-09-16: qty 2

## 2014-09-16 MED ORDER — TRAZODONE HCL 50 MG PO TABS
50.0000 mg | ORAL_TABLET | Freq: Every day | ORAL | Status: DC
  Administered 2014-09-16 – 2014-09-19 (×4): 50 mg via ORAL
  Filled 2014-09-16 (×5): qty 1

## 2014-09-16 NOTE — Progress Notes (Signed)
[facility] PROGRESS NOTE  Kaitlyn Burns QTM:[id number] DOB: 05-03-1925 DOA: 09/11/2014 PCP: No primary care provider on file. 81 year old lady recently discharged from the hospital after being treated for, gi bleed,  alcohol withdrawal and acute encephalopathy presents with parotitis, and urinary tract infection. Labs drawn on admission show elevated troponin and EKG shows repolarization changes and t wave inversions in the lateral leads. She is confused and not a great historian. She has improved today and is more awake and responding. She ate a little bit of jello . Plan for pT evaluation.  Assessment/Plan: Sepsis secondary to parotitis and UTI / leukocytosis   Sepsis criteria met on admission with tachycardia, tachypnea, leukocytosis and evidence of infection based on maxillofacial CT and UA.  Maxillofacial CT showed acute parotitis right greater than left without abscess or mass. UA showed large leukocytes and too numerous to count WBC.  Pt started on broad spectrum antibiotics on admission, vanco and aztreonam   Follow up blood cultures, urine culture  Show E COLI SENSITIVE     Gout:   HOLDING allopurinol as she is no ttaking anything po.    Hypertension  Optimal off meds.    Diabetes, controlled  Recent A1c 5.8 indicating good glycemic control.  Holding lantus as she is lethargic and not eating.    GERD (gastroesophageal reflux disease)  Continue protonix   Acute on chronic renal failure (CKD2-3)  Baseline creatinine 0.9-1.2. Creatinine is WNL on this admission.   Elevated point-of-care troponin  Mild elevation in troponin at 0.04. Likely demand ischemia from sepsis.  Has had mild troponin elevation on recent admission. EKG shows t wave inversions in the lateral leads.   Cardiology consulted and recommendations given.    Hypothyroidism   Continue Synthroid  Acute encephalopathy: Probably from the sepsis. Able to move all extremities,but lethargic.  MRI of the brain obtained. Did not show any ischemic changes though the exam was not optimal. Discussed the results with the patient's son.   Left upper extremity swelling and redness: Probably superficial thrombophlebitis. Improved.    DVT prophylaxis  Lovenox subQ ordered.  Radiological Exams on Admission: Ct Maxillofacial W/cm 09/11/2014 Acute parotitis right greater than left without abscess or mass. Chronic sinusitis right maxillary sinus pain    Code Status: DNR/DNI Family Communication: no family at the bedside ,spoke to [facility] ON 1/8  Disposition Plan: pending.   Consultants:  cardiology  Procedures:  none  Antibiotics:  IV vancomycin   IV aztreonam. 1/5  HPI/Subjective: Improved, more awake.   Objective: Filed Vitals:   09/16/14 1439  BP: 136/55  Pulse: 86  Temp: 98.1 F (36.7 C)  Resp: 18    Intake/Output Summary (Last 24 hours) at 09/16/14 1726 Last data filed at 09/16/14 6256  Gross per 24 hour  Intake      0 ml  Output    750 ml  Net   -750 ml   Filed Weights   09/12/14 1100  Weight: 60 kg (132 lb 4.4 oz)    Exam:   General:  Confused, restless   Cardiovascular: s1s2  Respiratory: diminished air entry at bases.   Abdomen:soft bowel sounds heard.   Musculoskeletal: no pedal edema.   Data Reviewed: Basic Metabolic Panel:  Recent Labs Lab 09/12/14 0445 09/13/14 0940 09/14/14 0810 09/15/14 0606 09/16/14 0554  NA 138 144 136 145 138  K 3.5 3.4* 3.1* 3.5 3.2*  CL 100 107 104 107 103  CO2 32 33* _0 GLUCOSE  101* 98 104* 74 126*  BUN _0 CREATININE 0.63 0.66 0.60 0.61 0.53  CALCIUM 7.9* 8.0* 7.7* 8.2* 7.7*  MG  --   --  1.8  --   --    Liver Function Tests:  Recent Labs Lab 09/11/14 1030  AST 29  ALT 31  ALKPHOS 153*  BILITOT 0.4  PROT 5.9*  ALBUMIN 2.4*   No results for input(s): LIPASE, AMYLASE in the last 168 hours. No results for input(s): AMMONIA in the last 168 hours. CBC:  Recent  Labs Lab 09/11/14 1030 09/12/14 0445 09/13/14 0940 09/14/14 0810 09/16/14 0554  WBC 26.8* 30.1* 26.9* 22.5* 20.5*  NEUTROABS 22.8*  --   --   --   --   HGB 12.1 10.7* 10.3* 10.9* 10.7*  HCT 37.9 33.1* 32.3* 34.7* 34.0*  MCV 102.2* 101.8* 102.2* 102.1* 100.6*  PLT 263 244 241 286 304   Cardiac Enzymes:  Recent Labs Lab 09/11/14 1030 09/12/14 1200 09/12/14 1658  TROPONINI 0.04* 0.04* 0.12*   BNP (last 3 results) No results for input(s): PROBNP in the last 8760 hours. CBG:  Recent Labs Lab 09/15/14 2015 09/16/14 0017 09/16/14 0402 09/16/14 0817 09/16/14 1159  GLUCAP 106* 103* 120* 115* 167*    Recent Results (from the past 240 hour(s))  Culture, Urine     Status: None   Collection Time: 09/11/14  3:15 PM  Result Value Ref Range Status   Specimen Description URINE, CATHETERIZED  Final   Special Requests NONE  Final   Colony Count   Final    >=100,000 COLONIES/ML Performed at [facility]    Culture   Final    ESCHERICHIA COLI Performed at [facility]    Report Status 09/13/2014 FINAL  Final   Organism ID, Bacteria ESCHERICHIA COLI  Final      Susceptibility   Escherichia coli - MIC*    AMPICILLIN >=32 RESISTANT Resistant     CEFAZOLIN <=4 SENSITIVE Sensitive     CEFTRIAXONE <=1 SENSITIVE Sensitive     CIPROFLOXACIN >=4 RESISTANT Resistant     GENTAMICIN <=1 SENSITIVE Sensitive     LEVOFLOXACIN >=8 RESISTANT Resistant     NITROFURANTOIN <=16 SENSITIVE Sensitive     TOBRAMYCIN <=1 SENSITIVE Sensitive     TRIMETH/SULFA <=20 SENSITIVE Sensitive     PIP/TAZO 8 SENSITIVE Sensitive     * ESCHERICHIA COLI  Culture, blood (routine x 2)     Status: None (Preliminary result)   Collection Time: 09/11/14  4:25 PM  Result Value Ref Range Status   Specimen Description Blood  Final   Special Requests NONE  Final   Culture   Final           BLOOD CULTURE RECEIVED NO GROWTH TO DATE CULTURE WILL BE HELD FOR 5 DAYS BEFORE ISSUING A FINAL NEGATIVE  REPORT Performed at [facility]    Report Status PENDING  Incomplete     Studies: No results found.  Scheduled Meds: . aztreonam  1 g Intravenous Q8H  . diltiazem  240 mg Oral Daily  . enoxaparin (LOVENOX) injection  40 mg Subcutaneous Q24H  . feeding supplement (RESOURCE BREEZE)  1 Container Oral TID BM  . ferrous sulfate  325 mg Oral TID WC  . folic acid  1 mg Oral Daily  . insulin aspart  0-9 Units Subcutaneous 6 times per day  . latanoprost  1 drop Both Eyes QHS  . levothyroxine  37.5 mcg  Intravenous Daily  . metoprolol tartrate  12.5 mg Oral BID  . multivitamin with minerals  1 tablet Oral Daily  . pantoprazole (PROTONIX) IV  40 mg Intravenous Q24H  . potassium chloride  40 mEq Oral Once  . psyllium  1 packet Oral QHS  . thiamine  100 mg Oral Daily   Or  . thiamine  100 mg Intravenous Daily  . valproate sodium  250 mg Intravenous q morning - 10a  . valproate sodium  500 mg Intravenous Q2200  . vancomycin  750 mg Intravenous Q24H   Continuous Infusions: . dextrose 5 % and 0.9% NaCl 75 mL/hr at 09/16/14 0718    Principal Problem:   Parotitis Active Problems:   Gout   Hypertension   Diabetes   GERD (gastroesophageal reflux disease)   Diabetes mellitus   Sepsis   Elevated troponin I level    Time spent: 35 minutes.     [place] [facility] Pager [phone]. If 7PM-7AM, please contact night-coverage at [email or web address], password [facility] 09/16/2014, 5:26 PM  LOS: 5 days

## 2014-09-17 LAB — BASIC METABOLIC PANEL
Anion gap: 8 (ref 5–15)
BUN: <5 mg/dL — ABNORMAL LOW (ref 6–23)
CO2: 28 mmol/L (ref 19–32)
CREATININE: 0.53 mg/dL (ref 0.50–1.10)
Calcium: 8.2 mg/dL — ABNORMAL LOW (ref 8.4–10.5)
Chloride: 107 mEq/L (ref 96–112)
GFR calc Af Amer: >90 mL/min (ref >90)
GFR calc non Af Amer: 83 mL/min — ABNORMAL LOW (ref >90)
Glucose, Bld: 142 mg/dL — ABNORMAL HIGH (ref 70–99)
Potassium: 3.4 mmol/L — ABNORMAL LOW (ref 3.5–5.1)
Sodium: 143 mmol/L (ref 135–145)

## 2014-09-17 LAB — CULTURE, BLOOD (ROUTINE X 2): CULTURE: NO GROWTH

## 2014-09-17 LAB — CBC
HCT: 33 % — ABNORMAL LOW (ref 36.0–46.0)
HEMOGLOBIN: 10.7 g/dL — AB (ref 12.0–15.0)
MCH: 32.3 pg (ref 26.0–34.0)
MCHC: 32.4 g/dL (ref 30.0–36.0)
MCV: 99.7 fL (ref 78.0–100.0)
PLATELETS: 291 10*3/uL (ref 150–400)
RBC: 3.31 MIL/uL — ABNORMAL LOW (ref 3.87–5.11)
RDW: 14.9 % (ref 11.5–15.5)
WBC: 16.1 10*3/uL — ABNORMAL HIGH (ref 4.0–10.5)

## 2014-09-17 LAB — GLUCOSE, CAPILLARY
GLUCOSE-CAPILLARY: 100 mg/dL — AB (ref 70–99)
GLUCOSE-CAPILLARY: 137 mg/dL — AB (ref 70–99)
GLUCOSE-CAPILLARY: 141 mg/dL — AB (ref 70–99)
GLUCOSE-CAPILLARY: 233 mg/dL — AB (ref 70–99)
GLUCOSE-CAPILLARY: 86 mg/dL (ref 70–99)
Glucose-Capillary: 113 mg/dL — ABNORMAL HIGH (ref 70–99)

## 2014-09-17 MED ORDER — POLYETHYLENE GLYCOL 3350 17 G PO PACK
17.0000 g | PACK | Freq: Every day | ORAL | Status: DC
  Administered 2014-09-18 – 2014-09-20 (×3): 17 g via ORAL
  Filled 2014-09-17 (×4): qty 1

## 2014-09-17 MED ORDER — SENNOSIDES-DOCUSATE SODIUM 8.6-50 MG PO TABS
2.0000 | ORAL_TABLET | Freq: Two times a day (BID) | ORAL | Status: DC
  Administered 2014-09-17 – 2014-09-20 (×5): 2 via ORAL
  Filled 2014-09-17 (×8): qty 2

## 2014-09-17 MED ORDER — HYDROCODONE-ACETAMINOPHEN 5-325 MG PO TABS
2.0000 | ORAL_TABLET | Freq: Once | ORAL | Status: AC
  Administered 2014-09-17: 2 via ORAL
  Filled 2014-09-17: qty 2

## 2014-09-17 NOTE — Evaluation (Signed)
Clinical/Bedside Swallow Evaluation Patient Details  Name: Kaitlyn Burns MRN: [id number] Date of Birth: 1924/11/03  Today's Date: 09/17/2014 Time: 4098-1191 SLP Time Calculation (min) (ACUTE ONLY): 15 min  Past Medical History:  Past Medical History  Diagnosis Date  . Diabetes mellitus   . Thyroid disease   . Complication of anesthesia     trouble intubating surgery  . Difficult intubation   . Arthritis   . UTI (lower urinary tract infection)    Past Surgical History:  Past Surgical History  Procedure Laterality Date  . Abdominal hysterectomy    . Hemorroidectomy    . Medial partial knee replacement    . Thyroidectomy, partial    . Shoulder surgery Right     "had shredded a muscle"  . Achilles tendon surgery Left   . Foot surgery Left     left heel infection that went to bone per spouse  . Lower abdominel surgery    . Esophagogastroduodenoscopy (egd) with propofol N/A 08/28/2014    Procedure: ESOPHAGOGASTRODUODENOSCOPY (EGD) WITH PROPOFOL;  Surgeon: Louis Meckel, MD;  Location: [facility];  Service: Endoscopy;  Laterality: N/A;  . Colonoscopy N/A 08/29/2014    Procedure: COLONOSCOPY;  Surgeon: Louis Meckel, MD;  Location: [facility];  Service: Endoscopy;  Laterality: N/A;   HPI:  81 yo female adm to [facility] with parotitis and acute encephalopathy.  PMH + for recent hospital admit with rectal bleeding and was seen for Swallow evaluation at that time.  She also has h/o ETOH use, gout, GERD.  SLP attempted to see pt on 1/9 but she was not adequately alert to participate or accept po intake.  Today being seen for swallow evaluation.     Assessment / Plan / Recommendation Clinical Impression  Pt observed with minimal intake secondary to her mental status/lethargy.  She did accept water via cup/straw - delayed swallow noted without overt indications of airway compromise.  Pt became sleepy (closing her eyes) during session and therefore SLP ceased po trials with concern for  aspiration risk.  Note pt with sluggish palatal elevation upon phonation.    Recommend continue clears or advance to full liquids with strict precautions.  Pt is dysarthric today which impairs her oral control of boluses.  SLP to follow = expect resolution of acute dysphagia with mental status returning to baseline/resolution of encephalopathy.      Aspiration Risk  Moderate    Diet Recommendation Thin liquid (clears vs full liquids)   Liquid Administration via: Cup;Straw Medication Administration: Whole meds with puree Supervision: Staff to assist with self feeding Compensations: Slow rate;Small sips/bites Postural Changes and/or Swallow Maneuvers: Seated upright 90 degrees;Upright 30-60 min after meal    Other  Recommendations Oral Care Recommendations: Oral care BID   Follow Up Recommendations   (TBD)    Frequency and Duration min 2x/week  1 week   Pertinent Vitals/Pain Afebrile, decreased     Swallow Study Prior Functional Status   Recent admission    General Date of Onset: 09/17/14 HPI: 81 yo female adm to [facility] with parotitis and acute encephalopathy.  PMH + for recent hospital admit with rectal bleeding and was seen for Swallow evaluation at that time.  She also has h/o ETOH use, gout, GERD.  SLP attempted to see pt on 1/9 but she was not adequately alert to participate or accept po intake.  Today being seen for swallow evaluation.   Diet Prior to this Study: Thin liquids (clears) Temperature Spikes Noted: No Respiratory Status:  Nasal cannula History of Recent Intubation: No Behavior/Cognition: Alert;Cooperative;Lethargic Oral Cavity - Dentition: Adequate natural dentition Self-Feeding Abilities: Needs set up;Needs assist Patient Positioning: Upright in bed Baseline Vocal Quality: Clear Volitional Cough: Cognitively unable to elicit Volitional Swallow: Unable to elicit    Oral/Motor/Sensory Function Overall Oral Motor/Sensory Function:  (no focal CN deficits, pt with  appearance of ? mild facial edema - likely due to parotitis)   Ice Chips Ice chips: Not tested   Thin Liquid Thin Liquid: Impaired Presentation: Straw;Cup Oral Phase Impairments: Reduced labial seal;Reduced lingual movement/coordination Pharyngeal  Phase Impairments: Suspected delayed Swallow    Nectar Thick Nectar Thick Liquid: Not tested   Honey Thick Honey Thick Liquid: Not tested   Puree Puree: Not tested Other Comments: pt became sleepy during session and therefore SLP ceased po trials   Solid   GO    Solid: Not tested Other Comments: pt became sleepy during session and therefore SLP ceased po trials      Kaitlyn Burnetamara Celestia Duva, MS [facility] SLP [phone]

## 2014-09-17 NOTE — Progress Notes (Signed)
ANTIBIOTIC CONSULT NOTE  Pharmacy Consult for vancomycin, aztreonam Indication: sepsis secondary to acute parotitis, UTI  Allergies  Allergen Reactions  . Ciprofloxacin   . Gabapentin     unknown  . Lorazepam     unknown  . Metoclopramide     unknown  . Penicillins     unknown  . Tape     unknown  . Teflaro [Ceftaroline]     unknown    Patient Measurements: Height: 5\' 5"  (165.1 cm) Weight: 132 lb 4.4 oz (60 kg) IBW/kg (Calculated) : 57   Vital Signs: Temp: 97.7 F (36.5 C) (01/11 0621) Temp Source: Oral (01/11 0621) BP: 142/64 mmHg (01/11 0621) Pulse Rate: 81 (01/11 0621) Intake/Output from previous day: 01/10 0701 - 01/11 0700 In: -  Out: 1850 [Urine:1850] Intake/Output from this shift: Total I/O In: 100 [P.O.:100] Out: 1000 [Urine:1000]  Labs:  Recent Labs  09/15/14 0606 09/16/14 0554 09/17/14 1050  WBC  --  20.5* 16.1*  HGB  --  10.7* 10.7*  PLT  --  304 291  CREATININE 0.61 0.53 0.53   Estimated Creatinine Clearance: 44.6 mL/min (by C-G formula based on Cr of 0.53). No results for input(s): VANCOTROUGH, VANCOPEAK, VANCORANDOM, GENTTROUGH, GENTPEAK, GENTRANDOM, TOBRATROUGH, TOBRAPEAK, TOBRARND, AMIKACINPEAK, AMIKACINTROU, AMIKACIN in the last 72 hours.   Medical History: Past Medical History  Diagnosis Date  . Diabetes mellitus   . Thyroid disease   . Complication of anesthesia     trouble intubating surgery  . Difficult intubation   . Arthritis   . UTI (lower urinary tract infection)    81yo F from NH w/ bilateral neck swelling and pain and strong smelling urine. Recent admission for encephalopathy and hypotension. Pharmacy is asked to dose Vancomycin for UTI and soft tissue infection. Recent weight 60kg. F/u plan for additional abx to cover possible urinary pathogens.  1/5 >> Vanc >> 1/5 >> Aztreonam >>  Tmax: AF since admission WBCs: trending down from admit though still elevated Renal: SCr 0.53 stable, CrCl 45 CG  1/5 blood:  NGF 1/5 urine: >100K E.Coli, resistant to Amp and FQs, non-ESBL  Drug level / dose changes info: 1/6: MD d/c'd vanc orders (?) yesterday, but wrote in note to continue, so I re-ordered maintenance dose STAT. 1/8: VT= 20.8 on 500 q12h after 1g LD. Reduced to 750 q24h after held AM dose  1/11: D7 Vanc now 750mg  q24/aztreonam 1g q8h for sepsis 2/2 acute parotitis and UTI. Unsure of plan at this point.  Has been unable to tolerate PO meds the last few days. Left MD SN regarding narrowing abx  LN 1/11  Goal of Therapy:  Vancomycin trough level 15-20 mcg/ml  Appropriate antibiotic dosing for indication and renal function; eradication of infection.   Plan:   Continue vancomycin 750 mg IV q24h for now  Continue current aztreonam dosage (1g IV q8h)  Unless still high suspicion for MRSA parotitis, suggest narrowing to cefazolin and changing aztreonam to Flagyl for anaerobic coverage c/w parotid infection  Follow renal function, clinical course.  Await further word on planned duration of therapy.  Bernadene Personrew Tabius Rood, PharmD Pager: [phone] 09/17/2014, 12:47 PM

## 2014-09-17 NOTE — Evaluation (Signed)
Physical Therapy Evaluation Patient Details Name: Kaitlyn Burns MRN: [id number] DOB: 03/22/1925 Today's Date: 09/17/2014   History of Present Illness  81 year old female with past medical history of diabetes, chronic kidney disease stage 2, hypothyroidism, recent admission for acute encephalopathy because of alcohol withdrawal [facility] for past 24 hours prior to this admission  Clinical Impression  Pt admitted with above diagnosis. Pt currently with functional limitations due to the deficits listed below (see PT Problem List).  Pt will benefit from skilled PT to increase their independence and safety with mobility to allow discharge to the venue listed below.  Will follow while in acute venue; see below for details     Follow Up Recommendations SNF;Supervision/Assistance - 24 hour    Equipment Recommendations  None recommended by PT    Recommendations for Other Services       Precautions / Restrictions Precautions Precautions: Fall      Mobility  Bed Mobility Overal bed mobility: Needs Assistance Bed Mobility: Supine to Sit;Sit to Supine     Supine to sit: Mod assist;+2 for physical assistance Sit to supine: Mod assist;+2 for physical assistance      Transfers Overall transfer level: Needs assistance Equipment used: 2 person hand held assist Transfers: Sit to/from BJ'sStand;Stand Pivot Transfers Sit to Stand: +2 physical assistance;Mod assist Stand pivot transfers: +2 physical assistance;Mod assist       General transfer comment: +2 for safety, wt shift;  requires incr time and multi-modal cues, pt has difficulty coming to full stand, keeps knees flexed  Ambulation/Gait                Stairs            Wheelchair Mobility    Modified Rankin (Stroke Patients Only)       Balance             Standing balance-Leahy Scale: Zero                               Pertinent Vitals/Pain  Pain Assessment: No/denies pain Faces Pain Scale: No hurt    Home Living Family/patient expects to be discharged to:: Skilled nursing facility                 Additional Comments[facility]     Prior Function Level of Independence: Needs assistance   Gait / Transfers Assistance Needed: Per chart review, pt was non ambulatory - uses a w/c, but was able to transfer bed <> w/c            Hand Dominance        Extremity/Trunk Assessment               Lower Extremity Assessment: Generalized weakness         Communication      Cognition Arousal/Alertness: Awake/alert Behavior During Therapy: Restless   Area of Impairment: Orientation;Attention;Following commands;Safety/judgement;Awareness;Problem solving Orientation Level: Disoriented to;Place;Time;Situation Current Attention Level: Sustained Memory: Decreased short-term memory Following Commands: Follows one step commands with increased time Safety/Judgement: Decreased awareness of safety;Decreased awareness of deficits   Problem Solving: Requires verbal cues;Requires tactile cues      General Comments      Exercises        Assessment/Plan    PT Assessment Patient needs continued PT services  PT Diagnosis Generalized weakness;Altered mental status  PT Problem List Decreased strength;Decreased range of motion;Decreased activity tolerance;Decreased balance;Decreased mobility;Decreased cognition;Decreased knowledge of use of DME;Decreased safety awareness;Pain  PT Treatment Interventions DME instruction;Functional mobility training;Therapeutic activities;Therapeutic exercise;Patient/family education;Balance training   PT Goals (Current goals can be found in the Care Plan section) Acute Rehab PT Goals Patient Stated Goal: none stated PT Goal Formulation: Patient unable to participate in goal setting Time For Goal Achievement: 09/24/14 Potential to Achieve Goals: Fair     Frequency Min 3X/week   Barriers to discharge        Co-evaluation               End of Session Equipment Utilized During Treatment: Gait belt Activity Tolerance: Patient limited by fatigue Patient left: in bed;with call bell/phone within reach;with bed alarm set Nurse Communication: Mobility status         Time: 1216-1242 PT Time Calculation (min) (ACUTE ONLY): 26 min   Charges:   PT Evaluation $Initial PT Evaluation Tier I: 1 Procedure PT Treatments $Therapeutic Activity: 23-37 mins   PT G Codes:        Alianny Toelle 09-25-14, 1:09 PM

## 2014-09-17 NOTE — Progress Notes (Addendum)
[facility] PROGRESS NOTE  Kla Bily QIH:[id number] DOB: 1925-08-12 DOA: 09/11/2014 PCP: No primary care provider on file. 81 year old lady recently discharged from the hospital after being treated for, gi bleed,  alcohol withdrawal and acute encephalopathy presents with parotitis, and urinary tract infection. Labs drawn on admission show elevated troponin and EKG shows repolarization changes and t wave inversions in the lateral leads. She is confused and not a great historian. She has improved today and is more awake and responding. She ate a little bit of jello . Plan for pT evaluation.  Assessment/Plan: Sepsis secondary to parotitis and UTI / leukocytosis   Sepsis criteria met on admission with tachycardia, tachypnea, leukocytosis and evidence of infection based on maxillofacial CT and UA.  Maxillofacial CT showed acute parotitis right greater than left without abscess or mass. UA showed large leukocytes and too numerous to count WBC.  Pt started on broad spectrum antibiotics on admission, vanco and aztreonam   Follow up blood cultures, urine culture  Show E COLI SENSITIVE     Gout:   Resuming allopurinol.    Hypertension  Optimal off meds.    Diabetes, controlled  Recent A1c 5.8 indicating good glycemic control.  Holding lantus as she is lethargic and not eating.    GERD (gastroesophageal reflux disease)  Continue protonix   Acute on chronic renal failure (CKD2-3)  Baseline creatinine 0.9-1.2. Creatinine is WNL on this admission.   Elevated point-of-care troponin  Mild elevation in troponin at 0.04. Likely demand ischemia from sepsis.  Has had mild troponin elevation on recent admission. EKG shows t wave inversions in the lateral leads.   Cardiology consulted and recommendations given.    Hypothyroidism   Continue Synthroid  Acute encephalopathy: Improving, . Probably from the sepsis. Able to move all extremities,but lethargic. MRI of the brain  obtained. Did not show any ischemic changes though the exam was not optimal. Discussed the results with the patient's son.   Left upper extremity swelling and redness: Probably superficial thrombophlebitis. Improved.   Constipation Started on stool softners.     DVT prophylaxis  Lovenox subQ ordered.  Radiological Exams on Admission: Ct Maxillofacial W/cm 09/11/2014 Acute parotitis right greater than left without abscess or mass. Chronic sinusitis right maxillary sinus pain    Code Status: DNR/DNI Family Communication: no family at the bedside ,spoke to [facility] ON 1/8  Disposition Plan: pending.   Consultants:  cardiology  Procedures:  none  Antibiotics:  IV vancomycin   IV aztreonam. 1/5  HPI/Subjective: Improved, more awake.   Objective: Filed Vitals:   09/17/14 0621  BP: 142/64  Pulse: 81  Temp: 97.7 F (36.5 C)  Resp: 20    Intake/Output Summary (Last 24 hours) at 09/17/14 1034 Last data filed at 09/17/14 0915  Gross per 24 hour  Intake    100 ml  Output   2300 ml  Net  -2200 ml   Filed Weights   09/12/14 1100  Weight: 60 kg (132 lb 4.4 oz)    Exam:   General:  Confused, restless   Cardiovascular: s1s2  Respiratory: diminished air entry at bases.   Abdomen:soft bowel sounds heard.   Musculoskeletal: no pedal edema.   Data Reviewed: Basic Metabolic Panel:  Recent Labs Lab 09/12/14 0445 09/13/14 0940 09/14/14 0810 09/15/14 0606 09/16/14 0554  NA 138 144 136 145 138  K 3.5 3.4* 3.1* 3.5 3.2*  CL 100 107 104 107 103  CO2 32 33* 29 31 28   GLUCOSE 101*  98 104* 74 126*  BUN 20 19 14 10 7   CREATININE 0.63 0.66 0.60 0.61 0.53  CALCIUM 7.9* 8.0* 7.7* 8.2* 7.7*  MG  --   --  1.8  --   --    Liver Function Tests:  Recent Labs Lab 09/11/14 1030  AST 29  ALT 31  ALKPHOS 153*  BILITOT 0.4  PROT 5.9*  ALBUMIN 2.4*   No results for input(s): LIPASE, AMYLASE in the last 168 hours. No results for input(s): AMMONIA in the last  168 hours. CBC:  Recent Labs Lab 09/11/14 1030 09/12/14 0445 09/13/14 0940 09/14/14 0810 09/16/14 0554  WBC 26.8* 30.1* 26.9* 22.5* 20.5*  NEUTROABS 22.8*  --   --   --   --   HGB 12.1 10.7* 10.3* 10.9* 10.7*  HCT 37.9 33.1* 32.3* 34.7* 34.0*  MCV 102.2* 101.8* 102.2* 102.1* 100.6*  PLT 263 244 241 286 304   Cardiac Enzymes:  Recent Labs Lab 09/11/14 1030 09/12/14 1200 09/12/14 1658  TROPONINI 0.04* 0.04* 0.12*   BNP (last 3 results) No results for input(s): PROBNP in the last 8760 hours. CBG:  Recent Labs Lab 09/16/14 1706 09/16/14 2051 09/16/14 2355 09/17/14 0611 09/17/14 0744  GLUCAP 150* 139* 141* 100* 86    Recent Results (from the past 240 hour(s))  Culture, Urine     Status: None   Collection Time: 09/11/14  3:15 PM  Result Value Ref Range Status   Specimen Description URINE, CATHETERIZED  Final   Special Requests NONE  Final   Colony Count   Final    >=100,000 COLONIES/ML Performed at [facility]    Culture   Final    ESCHERICHIA COLI Performed at [facility]    Report Status 09/13/2014 FINAL  Final   Organism ID, Bacteria ESCHERICHIA COLI  Final      Susceptibility   Escherichia coli - MIC*    AMPICILLIN >=32 RESISTANT Resistant     CEFAZOLIN <=4 SENSITIVE Sensitive     CEFTRIAXONE <=1 SENSITIVE Sensitive     CIPROFLOXACIN >=4 RESISTANT Resistant     GENTAMICIN <=1 SENSITIVE Sensitive     LEVOFLOXACIN >=8 RESISTANT Resistant     NITROFURANTOIN <=16 SENSITIVE Sensitive     TOBRAMYCIN <=1 SENSITIVE Sensitive     TRIMETH/SULFA <=20 SENSITIVE Sensitive     PIP/TAZO 8 SENSITIVE Sensitive     * ESCHERICHIA COLI  Culture, blood (routine x 2)     Status: None   Collection Time: 09/11/14  4:25 PM  Result Value Ref Range Status   Specimen Description Blood  Final   Special Requests NONE  Final   Culture   Final    NO GROWTH 5 DAYS Performed at [facility]    Report Status 09/17/2014 FINAL  Final      Studies: No results found.  Scheduled Meds: . aztreonam  1 g Intravenous Q8H  . diltiazem  240 mg Oral Daily  . enoxaparin (LOVENOX) injection  40 mg Subcutaneous Q24H  . feeding supplement (RESOURCE BREEZE)  1 Container Oral TID BM  . ferrous sulfate  325 mg Oral TID WC  . folic acid  1 mg Oral Daily  . insulin aspart  0-9 Units Subcutaneous 6 times per day  . latanoprost  1 drop Both Eyes QHS  . levothyroxine  37.5 mcg Intravenous Daily  . metoprolol tartrate  12.5 mg Oral BID  . multivitamin with minerals  1 tablet Oral Daily  . pantoprazole (PROTONIX)  IV  40 mg Intravenous Q24H  . psyllium  1 packet Oral QHS  . thiamine  100 mg Oral Daily   Or  . thiamine  100 mg Intravenous Daily  . traZODone  50 mg Oral QHS  . valproate sodium  250 mg Intravenous q morning - 10a  . valproate sodium  500 mg Intravenous Q2200  . vancomycin  750 mg Intravenous Q24H   Continuous Infusions: . dextrose 5 % and 0.9% NaCl 75 mL/hr at 09/16/14 2240    Principal Problem:   Parotitis Active Problems:   Gout   Hypertension   Diabetes   GERD (gastroesophageal reflux disease)   Diabetes mellitus   Sepsis   Elevated troponin I level    Time spent: 15 minutes.     [facility] Pager [phone]. If 7PM-7AM, please contact night-coverage at [email or web address], password [facility] 09/17/2014, 10:34 AM  LOS: 6 days

## 2014-09-18 LAB — CBC
HEMATOCRIT: 30.6 % — AB (ref 36.0–46.0)
Hemoglobin: 9.8 g/dL — ABNORMAL LOW (ref 12.0–15.0)
MCH: 32.1 pg (ref 26.0–34.0)
MCHC: 32 g/dL (ref 30.0–36.0)
MCV: 100.3 fL — ABNORMAL HIGH (ref 78.0–100.0)
Platelets: 264 10*3/uL (ref 150–400)
RBC: 3.05 MIL/uL — ABNORMAL LOW (ref 3.87–5.11)
RDW: 15.1 % (ref 11.5–15.5)
WBC: 15.8 10*3/uL — ABNORMAL HIGH (ref 4.0–10.5)

## 2014-09-18 LAB — GLUCOSE, CAPILLARY
GLUCOSE-CAPILLARY: 151 mg/dL — AB (ref 70–99)
Glucose-Capillary: 111 mg/dL — ABNORMAL HIGH (ref 70–99)
Glucose-Capillary: 196 mg/dL — ABNORMAL HIGH (ref 70–99)
Glucose-Capillary: 167 mg/dL — ABNORMAL HIGH (ref 70–99)
Glucose-Capillary: 100 mg/dL — ABNORMAL HIGH (ref 70–99)

## 2014-09-18 LAB — BASIC METABOLIC PANEL
Anion gap: 6 (ref 5–15)
BUN: 5 mg/dL — AB (ref 6–23)
CHLORIDE: 105 meq/L (ref 96–112)
CO2: 28 mmol/L (ref 19–32)
CREATININE: 0.62 mg/dL (ref 0.50–1.10)
Calcium: 7.6 mg/dL — ABNORMAL LOW (ref 8.4–10.5)
GFR calc Af Amer: >90 mL/min (ref >90)
GFR calc non Af Amer: 79 mL/min — ABNORMAL LOW (ref >90)
GLUCOSE: 205 mg/dL — AB (ref 70–99)
POTASSIUM: 2.5 mmol/L — AB (ref 3.5–5.1)
SODIUM: 139 mmol/L (ref 135–145)

## 2014-09-18 LAB — MAGNESIUM: MAGNESIUM: 1.3 mg/dL — AB (ref 1.5–2.5)

## 2014-09-18 MED ORDER — HYDROCODONE-ACETAMINOPHEN 5-325 MG PO TABS
1.0000 | ORAL_TABLET | Freq: Four times a day (QID) | ORAL | Status: DC | PRN
  Administered 2014-09-18 – 2014-09-19 (×3): 2 via ORAL
  Administered 2014-09-19 (×2): 1 via ORAL
  Administered 2014-09-19 – 2014-09-20 (×2): 2 via ORAL
  Filled 2014-09-18 (×4): qty 2
  Filled 2014-09-18: qty 1
  Filled 2014-09-18: qty 2
  Filled 2014-09-18: qty 1

## 2014-09-18 MED ORDER — POTASSIUM CHLORIDE 10 MEQ/100ML IV SOLN
10.0000 meq | INTRAVENOUS | Status: AC
  Administered 2014-09-18 (×3): 10 meq via INTRAVENOUS
  Filled 2014-09-18 (×3): qty 100

## 2014-09-18 NOTE — Progress Notes (Signed)
[facility] PROGRESS NOTE  Kaitlyn Burns WUG:[id number] DOB: 08-21-1925 DOA: 09/11/2014 PCP: No primary care provider on file. 81 year old lady recently discharged from the hospital after being treated for, gi bleed,  alcohol withdrawal and acute encephalopathy presents with parotitis, and urinary tract infection. Labs drawn on admission show elevated troponin and EKG shows repolarization changes and t wave inversions in the lateral leads. She is confused and not a great historian. She has improved today and is more awake and responding.  Speech and swallow evaluation orderd and recommended soft diet with thin liquids.  Physical therapy evaluation.   Assessment/Plan: Sepsis secondary to parotitis and UTI / leukocytosis   Sepsis criteria met on admission with tachycardia, tachypnea, leukocytosis and evidence of infection based on maxillofacial CT and UA.  Maxillofacial CT showed acute parotitis right greater than left without abscess or mass. UA showed large leukocytes and too numerous to count WBC.  Pt started on broad spectrum antibiotics on admission, vanco and aztreonam   Follow up blood cultures, urine culture  Show E COLI SENSITIVE     Gout:  Resuming allopurinol.    Hypertension Optimal off meds.    Diabetes, controlled Recent A1c 5.8 indicating good glycemic control. Holding lantus as she is lethargic and not eating.    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 Creatinine is WNL on this admission.   Elevated point-of-care troponin Mild elevation in troponin at 0.04. Likely demand ischemia from sepsis. Has had mild troponin elevation on recent admission. EKG shows t wave inversions in the lateral leads.  Cardiology consulted and recommendations given.    Hypothyroidism  Continue Synthroid  Acute encephalopathy: Improved. She is more awake and alert.  Probably from the  sepsis. Able to move all extremities, MRI of the brain obtained. Did not show any ischemic changes though the exam was not optimal. Discussed the results with the patient's son.   Left upper extremity swelling and redness: Probably superficial thrombophlebitis. Improved.   Constipation Started on stool softners. Manual disimpaction yesterday.     DVT prophylaxis  Lovenox subQ ordered.  Radiological Exams on Admission: Ct Maxillofacial W/cm 09/11/2014 Acute parotitis right greater than left without abscess or mass. Chronic sinusitis right maxillary sinus pain    Code Status: DNR/DNI Family Communication: no family at the bedside ,spoke to [facility] ON 1/8  Disposition Plan: pending, possible d/c back to SNF in 1 to 2 days.   Consultants:  Cardiology  Speech therapy  Physical therapy.  Procedures:  none  Antibiotics:  IV vancomycin   IV aztreonam. 1/5  HPI/Subjective: Improved, more awake. Sitting in the chair, participated in therapy.   Objective: Filed Vitals:   09/18/14 1001  BP: 119/47  Pulse: 75  Temp: 98.3 F (36.8 C)  Resp: 18    Intake/Output Summary (Last 24 hours) at 09/18/14 1635 Last data filed at 09/18/14 1629  Gross per 24 hour  Intake 2830.6 ml  Output   1426 ml  Net 1404.6 ml   Filed Weights   09/12/14 1100  Weight: 60 kg (132 lb 4.4 oz)    Exam:   General:  Alert awake and comfortable.   Cardiovascular: s1s2  Respiratory: diminished air entry at bases.   Abdomen:soft bowel sounds heard.   Musculoskeletal: no pedal edema.   Data Reviewed: Basic Metabolic Panel:  Recent Labs Lab 09/13/14 0940 09/14/14 0810 09/15/14 0606 09/16/14 0554 09/17/14 1050  NA 144 136 145 138  143  K 3.4* 3.1* 3.5 3.2* 3.4*  CL 107 104 107 103 107  CO2 33* 29 31 28 28   GLUCOSE 98 104* 74 126* 142*  BUN 19 14 10 7  <5*  CREATININE 0.66 0.60 0.61 0.53 0.53  CALCIUM 8.0* 7.7* 8.2* 7.7* 8.2*  MG  --  1.8  --   --   --    Liver Function  Tests: No results for input(s): AST, ALT, ALKPHOS, BILITOT, PROT, ALBUMIN in the last 168 hours. No results for input(s): LIPASE, AMYLASE in the last 168 hours. No results for input(s): AMMONIA in the last 168 hours. CBC:  Recent Labs Lab 09/13/14 0940 09/14/14 0810 09/16/14 0554 09/17/14 1050 09/18/14 0345  WBC 26.9* 22.5* 20.5* 16.1* 15.8*  HGB 10.3* 10.9* 10.7* 10.7* 9.8*  HCT 32.3* 34.7* 34.0* 33.0* 30.6*  MCV 102.2* 102.1* 100.6* 99.7 100.3*  PLT 241 286 304 291 264   Cardiac Enzymes:  Recent Labs Lab 09/12/14 1200 09/12/14 1658  TROPONINI 0.04* 0.12*   BNP (last 3 results) No results for input(s): PROBNP in the last 8760 hours. CBG:  Recent Labs Lab 09/17/14 1133 09/17/14 1706 09/17/14 2039 09/18/14 0810 09/18/14 1124  GLUCAP 113* 137* 233* 111* 196*    Recent Results (from the past 240 hour(s))  Culture, Urine     Status: None   Collection Time: 09/11/14  3:15 PM  Result Value Ref Range Status   Specimen Description URINE, CATHETERIZED  Final   Special Requests NONE  Final   Colony Count   Final    >=100,000 COLONIES/ML Performed at [facility]    Culture   Final    ESCHERICHIA COLI Performed at [facility]    Report Status 09/13/2014 FINAL  Final   Organism ID, Bacteria ESCHERICHIA COLI  Final      Susceptibility   Escherichia coli - MIC*    AMPICILLIN >=32 RESISTANT Resistant     CEFAZOLIN <=4 SENSITIVE Sensitive     CEFTRIAXONE <=1 SENSITIVE Sensitive     CIPROFLOXACIN >=4 RESISTANT Resistant     GENTAMICIN <=1 SENSITIVE Sensitive     LEVOFLOXACIN >=8 RESISTANT Resistant     NITROFURANTOIN <=16 SENSITIVE Sensitive     TOBRAMYCIN <=1 SENSITIVE Sensitive     TRIMETH/SULFA <=20 SENSITIVE Sensitive     PIP/TAZO 8 SENSITIVE Sensitive     * ESCHERICHIA COLI  Culture, blood (routine x 2)     Status: None   Collection Time: 09/11/14  4:25 PM  Result Value Ref Range Status   Specimen Description Blood  Final   Special  Requests NONE  Final   Culture   Final    NO GROWTH 5 DAYS Performed at [facility]    Report Status 09/17/2014 FINAL  Final     Studies: No results found.  Scheduled Meds: . aztreonam  1 g Intravenous Q8H  . diltiazem  240 mg Oral Daily  . enoxaparin (LOVENOX) injection  40 mg Subcutaneous Q24H  . feeding supplement (RESOURCE BREEZE)  1 Container Oral TID BM  . ferrous sulfate  325 mg Oral TID WC  . folic acid  1 mg Oral Daily  . insulin aspart  0-9 Units Subcutaneous 6 times per day  . latanoprost  1 drop Both Eyes QHS  . levothyroxine  37.5 mcg Intravenous Daily  . metoprolol tartrate  12.5 mg Oral BID  . multivitamin with minerals  1 tablet Oral Daily  . pantoprazole (PROTONIX) IV  40 mg  Intravenous Q24H  . polyethylene glycol  17 g Oral Daily  . psyllium  1 packet Oral QHS  . senna-docusate  2 tablet Oral BID  . thiamine  100 mg Oral Daily   Or  . thiamine  100 mg Intravenous Daily  . traZODone  50 mg Oral QHS  . valproate sodium  250 mg Intravenous q morning - 10a  . valproate sodium  500 mg Intravenous Q2200  . vancomycin  750 mg Intravenous Q24H   Continuous Infusions: . dextrose 5 % and 0.9% NaCl 75 mL/hr at 09/18/14 0522    Principal Problem:   Parotitis Active Problems:   Gout   Hypertension   Diabetes   GERD (gastroesophageal reflux disease)   Diabetes mellitus   Sepsis   Elevated troponin I level    Time spent: 15 minutes.     [place] [facility] Pager [phone]. If 7PM-7AM, please contact night-coverage at [email or web address], password [facility] 09/18/2014, 4:35 PM  LOS: 7 days

## 2014-09-18 NOTE — Progress Notes (Signed)
Clinical Social Work  CSW continues to follow and keeps SNF updated on DC plans. CSW will continue to follow and [facility].  [place], [place] [phone]

## 2014-09-18 NOTE — Progress Notes (Signed)
Speech Language Pathology Treatment: Dysphagia  Patient Details Name: Kaitlyn Burns MRN: [id number] DOB: 06-15-1925 Today's Date: 09/18/2014 Time: 1430-1440 SLP Time Calculation (min) (ACUTE ONLY): 10 min  Assessment / Plan / Recommendation Clinical Impression  Pt with improved swallow ability likely as parotitis and acute encephalopathy are improving.  She was able to self feed *with assist to scoop* icecream, graham cracker and thin drink.  Delayed oral transiting and slow but effective mastication.  No oral pocketing noted but for energy conservation would recommend continue mechanical soft diet currently.  Intake listed as 50%.  As pt appears to be tolerating po diet and she has met her goal, current diet  appears appropriate and SLP will sign off.  Educated pt to findings (although uncertain she will retain information) and signs left for reinforcement.  Please reorder if indicated.    HPI HPI: 81 yo female adm to [facility] with parotitis and acute encephalopathy.  PMH + for recent hospital admit with rectal bleeding and was seen for Swallow evaluation at that time.  She also has h/o ETOH use, gout, GERD.  SLP attempted to see pt on 1/9 but she was not adequately alert to participate or accept po intake.  Today being seen for swallow evaluation.     Pertinent Vitals Pain Location: chronic back pain per pt  SLP Plan    no follow up   Recommendations Diet recommendations: Dysphagia 3 (mechanical soft);Thin liquid Liquids provided via: Cup;Straw Medication Administration: Whole meds with puree Supervision: Staff to assist with self feeding Compensations: Slow rate;Small sips/bites;Check for pocketing Postural Changes and/or Swallow Maneuvers: Seated upright 90 degrees;Upright 30-60 min after meal              Oral Care Recommendations: Oral care BID    GO    Luanna Salk, [facility] SLP [phone]

## 2014-09-19 DIAGNOSIS — E876 Hypokalemia: Secondary | ICD-10-CM

## 2014-09-19 DIAGNOSIS — N390 Urinary tract infection, site not specified: Secondary | ICD-10-CM

## 2014-09-19 LAB — BASIC METABOLIC PANEL
Anion gap: 7 (ref 5–15)
BUN: <5 mg/dL — ABNORMAL LOW (ref 6–23)
CO2: 27 mmol/L (ref 19–32)
Calcium: 7.9 mg/dL — ABNORMAL LOW (ref 8.4–10.5)
Chloride: 109 mEq/L (ref 96–112)
Creatinine, Ser: 0.58 mg/dL (ref 0.50–1.10)
GFR calc non Af Amer: 81 mL/min — ABNORMAL LOW (ref >90)
Glucose, Bld: 108 mg/dL — ABNORMAL HIGH (ref 70–99)
POTASSIUM: 3.1 mmol/L — AB (ref 3.5–5.1)
Sodium: 143 mmol/L (ref 135–145)

## 2014-09-19 LAB — CBC
HEMATOCRIT: 30.6 % — AB (ref 36.0–46.0)
Hemoglobin: 9.8 g/dL — ABNORMAL LOW (ref 12.0–15.0)
MCH: 31.6 pg (ref 26.0–34.0)
MCHC: 32 g/dL (ref 30.0–36.0)
MCV: 98.7 fL (ref 78.0–100.0)
Platelets: 302 10*3/uL (ref 150–400)
RBC: 3.1 MIL/uL — AB (ref 3.87–5.11)
RDW: 15.2 % (ref 11.5–15.5)
WBC: 14.5 10*3/uL — AB (ref 4.0–10.5)

## 2014-09-19 LAB — GLUCOSE, CAPILLARY
GLUCOSE-CAPILLARY: 105 mg/dL — AB (ref 70–99)
GLUCOSE-CAPILLARY: 117 mg/dL — AB (ref 70–99)
Glucose-Capillary: 102 mg/dL — ABNORMAL HIGH (ref 70–99)
Glucose-Capillary: 92 mg/dL (ref 70–99)
Glucose-Capillary: 121 mg/dL — ABNORMAL HIGH (ref 70–99)
Glucose-Capillary: 105 mg/dL — ABNORMAL HIGH (ref 70–99)

## 2014-09-19 MED ORDER — DEXTROSE 5 % IV SOLN
2.0000 g | Freq: Once | INTRAVENOUS | Status: AC
  Administered 2014-09-19: 2 g via INTRAVENOUS
  Filled 2014-09-19: qty 4

## 2014-09-19 MED ORDER — DOXYCYCLINE HYCLATE 100 MG PO TABS
100.0000 mg | ORAL_TABLET | Freq: Two times a day (BID) | ORAL | Status: DC
  Administered 2014-09-19 – 2014-09-20 (×3): 100 mg via ORAL
  Filled 2014-09-19 (×4): qty 1

## 2014-09-19 MED ORDER — MAGNESIUM SULFATE 2 GM/50ML IV SOLN
2.0000 g | Freq: Once | INTRAVENOUS | Status: DC
  Filled 2014-09-19: qty 50

## 2014-09-19 MED ORDER — METRONIDAZOLE 500 MG PO TABS
500.0000 mg | ORAL_TABLET | Freq: Three times a day (TID) | ORAL | Status: DC
Start: 2014-09-19 — End: 2014-09-20
  Administered 2014-09-19 – 2014-09-20 (×3): 500 mg via ORAL
  Filled 2014-09-19 (×6): qty 1

## 2014-09-19 MED ORDER — POTASSIUM CHLORIDE CRYS ER 20 MEQ PO TBCR
40.0000 meq | EXTENDED_RELEASE_TABLET | ORAL | Status: AC
  Administered 2014-09-19: 40 meq via ORAL
  Filled 2014-09-19 (×2): qty 2

## 2014-09-19 NOTE — Progress Notes (Signed)
Physical Therapy Treatment Patient Details Name: Kaitlyn GheeRobye Glaus MRN: [id number] DOB: 12/22/1924 Today's Date: 09/19/2014    History of Present Illness 81 year old female with past medical history of diabetes, chronic kidney disease stage 2, hypothyroidism, recent admission for acute encephalopathy because of alcohol withdrawal [facility] for past 24 hours prior to this admission    PT Comments    Pt more alert today and agreeable to therapy;  Follow Up Recommendations  SNF;Supervision/Assistance - 24 hour     Equipment Recommendations  None recommended by PT    Recommendations for Other Services       Precautions / Restrictions Precautions Precautions: Fall    Mobility  Bed Mobility               General bed mobility comments: pt in chair  Transfers Overall transfer level: Needs assistance Equipment used: Rolling walker (2 wheeled) Transfers: Sit to/from Stand Sit to Stand: +2 physical assistance;Mod assist         General transfer comment: +2 for safety, wt shift;  requires incr time and multi-modal cues, pt has difficulty coming to full stand, keeps knees flexed  Ambulation/Gait             General Gait Details: attmepted pre-gait activities, lateral wt shifting,  small steps in place with +2 for safety and balance   Stairs            Wheelchair Mobility    Modified Rankin (Stroke Patients Only)       Balance                                    Cognition Arousal/Alertness: Awake/alert Behavior During Therapy: WFL for tasks assessed/performed         Memory: Decreased short-term memory Following Commands: Follows one step commands with increased time       General Comments: pt is more alert today    Exercises      General Comments        Pertinent Vitals/Pain Pain Assessment: 0-10 Faces Pain Scale: Hurts little more Pain Location: chronic bil knee  pain Pain Descriptors / Indicators: Constant Pain Intervention(s): Limited activity within patient's tolerance;Monitored during session;Repositioned    Home Living                      Prior Function            PT Goals (current goals can now be found in the care plan section) Acute Rehab PT Goals Patient Stated Goal: none stated PT Goal Formulation: Patient unable to participate in goal setting Time For Goal Achievement: 09/24/14 Potential to Achieve Goals: Fair Progress towards PT goals: Progressing toward goals    Frequency  Min 3X/week    PT Plan Current plan remains appropriate    Co-evaluation             End of Session   Activity Tolerance: Patient limited by fatigue Patient left: in chair;with call bell/phone within reach;with chair alarm set     Time: 1135-1151 PT Time Calculation (min) (ACUTE ONLY): 16 min  Charges:  $Therapeutic Activity: 8-22 mins                    G Codes:      Berdell Hostetler 09/19/2014, 1:00 PM

## 2014-09-19 NOTE — Progress Notes (Signed)
[facility] PROGRESS NOTE  Kaitlyn Burns QXI:[id number] DOB: 20-Mar-1925 DOA: 09/11/2014    Brief HPI 81 year old lady recently discharged from the hospital after being treated for gi bleed, alcohol withdrawal and acute encephalopathy presented with parotitis, and urinary tract infection. She was also noted to have mildly elevated troponin. She was admitted for further management.  Assessment/Plan:  Sepsis secondary to Parotitis and UTI Sepsis criteria met on admission with tachycardia, tachypnea, leukocytosis and evidence of infection based on maxillofacial CT and UA. Patient was started on broad-spectrum antibiotic coverage with vancomycin and aztreonam. Patient has slowly improved. White count is improving.   UTI Urine cultures grew Escherichia coli pansensitive. Patient has completed 8 days of aztreonam. She has allergy to multiple antibiotics. Hence, IV antibiotics were continued. We will stop Aztreonam today as she should've completed treatment.  Parotitis This was seen on CT scan. Patient was started on vancomycin for presumed staph. Patient has improved. Her WBC is improving. Still has tenderness on the parotid glands. We will change her to oral doxycycline and and also start her on Flagyl for anaerobes. Blood cultures have been negative.  Gout Continue allopurinol.   Essential Hypertension Blood pressure has been stable. On Diltiazem and Metoprolol.  Diabetes mellitus type II, controlled Recent A1c 5.8 indicating good glycemic control. CBGs have been very well controlled. Holding lantus as she has been lethargic and not eating well.   GERD (gastroesophageal reflux disease) Continue protonix  Chronic renal failure (CKD2-3) Renal function has been stable.   Hypokalemia/Hypomagnesemia Will replace orally. Replace Mg as well.  Elevated point-of-care troponin Mild elevation in troponin at 0.04. Likely demand ischemia from sepsis. She underwent an echocardiogram which did  not show any wall motion abnormalities. Cardiology has seen the patient and advise no further workup.  Hypothyroidism  Continue Synthroid  Acute encephalopathy: Likely due to sepsis. This is improved. MRI brain was done but did not show any acute process. Global atrophy was noted.  Left upper extremity swelling and redness Probably superficial thrombophlebitis. Improved.   Constipation Started on stool softners. Manual disimpaction had to be done.   DVT prophylaxis: Lovenox Code Status: DNR/DNI Family Communication: Discussed with her son Kaitlyn Burns.  Disposition Plan: Back to SNF possibly in 1-2 days.  Consultants:  Cardiology  Speech therapy  Physical therapy.  Procedures: 2D ECHO Study Conclusions - Left ventricle: The cavity size was normal. There was mild focalbasal hypertrophy of the septum. Systolic function was normal.The estimated ejection fraction was in the range of 55% to 60%.Wall motion was normal; there were no regional wall motionabnormalities. Doppler parameters are consistent with abnormalleft ventricular relaxation (grade 1 diastolic dysfunction).Doppler parameters are consistent with high ventricular fillingpressure. - Mitral valve: Moderately calcified annulus. There was moderateregurgitation. - Left atrium: The atrium was mildly dilated. - Pulmonary arteries: Systolic pressure was moderately increased.PA peak pressure: 56 mm Hg (S).  Antibiotics:  IV vancomycin and IV aztreonam. 1/5-1/13  Doxy and Flagyl 1/13-->  Subjective: She is sitting up in the chair. She knew that she was in the hospital.  Objective: Filed Vitals:   09/19/14 0700  BP: 110/45  Pulse:   Temp:   Resp:     Intake/Output Summary (Last 24 hours) at 09/19/14 1138 Last data filed at 09/19/14 0917  Gross per 24 hour  Intake 3690.6 ml  Output   1650 ml  Net 2040.6 ml   Filed Weights   09/12/14 1100  Weight: 60 kg (132 lb 4.4 oz)    Exam:  General:  Alert awake  and comfortable.   Cardiovascular: s1s2  Respiratory: diminished air entry at bases.   Abdomen:soft bowel sounds heard.   Musculoskeletal: no pedal edema.   More awake. Oriented to place. Knew this was a new year.  Data Reviewed: Basic Metabolic Panel:  Recent Labs Lab 09/14/14 0810 09/15/14 0606 09/16/14 0554 09/17/14 1050 09/18/14 1610 09/19/14 0520  NA 136 145 138 143 139 143  K 3.1* 3.5 3.2* 3.4* 2.5* 3.1*  CL 104 107 103 107 105 109  CO2 29 31 28 28 28 27   GLUCOSE 104* 74 126* 142* 205* 108*  BUN 14 10 7  <5* 5* <5*  CREATININE 0.60 0.61 0.53 0.53 0.62 0.58  CALCIUM 7.7* 8.2* 7.7* 8.2* 7.6* 7.9*  MG 1.8  --   --   --  1.3*  --    CBC:  Recent Labs Lab 09/14/14 0810 09/16/14 0554 09/17/14 1050 09/18/14 0345 09/19/14 0520  WBC 22.5* 20.5* 16.1* 15.8* 14.5*  HGB 10.9* 10.7* 10.7* 9.8* 9.8*  HCT 34.7* 34.0* 33.0* 30.6* 30.6*  MCV 102.1* 100.6* 99.7 100.3* 98.7  PLT 286 304 291 264 302   Cardiac Enzymes:  Recent Labs Lab 09/12/14 1200 09/12/14 1658  TROPONINI 0.04* 0.12*   CBG:  Recent Labs Lab 09/18/14 1631 09/18/14 2046 09/18/14 2329 09/19/14 0359 09/19/14 0744  GLUCAP 167* 151* 100* 102* 92    Recent Results (from the past 240 hour(s))  Culture, Urine     Status: None   Collection Time: 09/11/14  3:15 PM  Result Value Ref Range Status   Specimen Description URINE, CATHETERIZED  Final   Special Requests NONE  Final   Colony Count   Final    >=100,000 COLONIES/ML Performed at [facility]    Culture   Final    ESCHERICHIA COLI Performed at [facility]    Report Status 09/13/2014 FINAL  Final   Organism ID, Bacteria ESCHERICHIA COLI  Final      Susceptibility   Escherichia coli - MIC*    AMPICILLIN >=32 RESISTANT Resistant     CEFAZOLIN <=4 SENSITIVE Sensitive     CEFTRIAXONE <=1 SENSITIVE Sensitive     CIPROFLOXACIN >=4 RESISTANT Resistant     GENTAMICIN <=1 SENSITIVE Sensitive     LEVOFLOXACIN >=8 RESISTANT  Resistant     NITROFURANTOIN <=16 SENSITIVE Sensitive     TOBRAMYCIN <=1 SENSITIVE Sensitive     TRIMETH/SULFA <=20 SENSITIVE Sensitive     PIP/TAZO 8 SENSITIVE Sensitive     * ESCHERICHIA COLI  Culture, blood (routine x 2)     Status: None   Collection Time: 09/11/14  4:25 PM  Result Value Ref Range Status   Specimen Description Blood  Final   Special Requests NONE  Final   Culture   Final    NO GROWTH 5 DAYS Performed at [facility]    Report Status 09/17/2014 FINAL  Final     Studies: No results found.  Scheduled Meds: . diltiazem  240 mg Oral Daily  . doxycycline  100 mg Oral Q12H  . enoxaparin (LOVENOX) injection  40 mg Subcutaneous Q24H  . feeding supplement (RESOURCE BREEZE)  1 Container Oral TID BM  . ferrous sulfate  325 mg Oral TID WC  . folic acid  1 mg Oral Daily  . insulin aspart  0-9 Units Subcutaneous 6 times per day  . latanoprost  1 drop Both Eyes QHS  . levothyroxine  37.5 mcg Intravenous Daily  .  magnesium sulfate 1 - 4 g bolus IVPB  2 g Intravenous Once  . metoprolol tartrate  12.5 mg Oral BID  . metroNIDAZOLE  500 mg Oral 3 times per day  . multivitamin with minerals  1 tablet Oral Daily  . pantoprazole (PROTONIX) IV  40 mg Intravenous Q24H  . polyethylene glycol  17 g Oral Daily  . potassium chloride  40 mEq Oral Q4H  . psyllium  1 packet Oral QHS  . senna-docusate  2 tablet Oral BID  . thiamine  100 mg Oral Daily   Or  . thiamine  100 mg Intravenous Daily  . traZODone  50 mg Oral QHS  . valproate sodium  250 mg Intravenous q morning - 10a  . valproate sodium  500 mg Intravenous Q2200   Continuous Infusions:    Principal Problem:   Parotitis Active Problems:   Gout   Hypertension   Diabetes   GERD (gastroesophageal reflux disease)   Diabetes mellitus   Sepsis   Elevated troponin I level   Time spent: 15 minutes.    [place] [facility] Pager [phone].   If 7PM-7AM, please contact night-coverage at  [email or web address], password [facility] 09/19/2014, 11:38 AM  LOS: 8 days

## 2014-09-20 LAB — BASIC METABOLIC PANEL
Anion gap: 8 (ref 5–15)
BUN: 6 mg/dL (ref 6–23)
CALCIUM: 8.5 mg/dL (ref 8.4–10.5)
CHLORIDE: 108 meq/L (ref 96–112)
CO2: 28 mmol/L (ref 19–32)
Creatinine, Ser: 0.7 mg/dL (ref 0.50–1.10)
GFR calc Af Amer: 88 mL/min — ABNORMAL LOW (ref >90)
GFR calc non Af Amer: 76 mL/min — ABNORMAL LOW (ref >90)
GLUCOSE: 111 mg/dL — AB (ref 70–99)
POTASSIUM: 4 mmol/L (ref 3.5–5.1)
Sodium: 144 mmol/L (ref 135–145)

## 2014-09-20 LAB — CBC
HCT: 33.7 % — ABNORMAL LOW (ref 36.0–46.0)
Hemoglobin: 10.6 g/dL — ABNORMAL LOW (ref 12.0–15.0)
MCH: 31.1 pg (ref 26.0–34.0)
MCHC: 31.5 g/dL (ref 30.0–36.0)
MCV: 98.8 fL (ref 78.0–100.0)
PLATELETS: 341 10*3/uL (ref 150–400)
RBC: 3.41 MIL/uL — ABNORMAL LOW (ref 3.87–5.11)
RDW: 15.1 % (ref 11.5–15.5)
WBC: 16.2 10*3/uL — AB (ref 4.0–10.5)

## 2014-09-20 LAB — GLUCOSE, CAPILLARY
GLUCOSE-CAPILLARY: 83 mg/dL (ref 70–99)
GLUCOSE-CAPILLARY: 125 mg/dL — AB (ref 70–99)
Glucose-Capillary: 84 mg/dL (ref 70–99)

## 2014-09-20 MED ORDER — DILTIAZEM HCL ER 120 MG PO CP24: 120.0000 mg | ORAL_CAPSULE | Freq: Every day | ORAL | Status: AC

## 2014-09-20 MED ORDER — BOOST / RESOURCE BREEZE PO LIQD: 1.0000 | Freq: Three times a day (TID) | ORAL | Status: DC

## 2014-09-20 MED ORDER — DIVALPROEX SODIUM ER 500 MG PO TB24
500.0000 mg | ORAL_TABLET | Freq: Every day | ORAL | Status: DC
  Filled 2014-09-20: qty 1

## 2014-09-20 MED ORDER — DIVALPROEX SODIUM ER 250 MG PO TB24: 250.0000 mg | ORAL_TABLET | Freq: Every morning | ORAL | Status: DC

## 2014-09-20 MED ORDER — THIAMINE HCL 100 MG PO TABS: 100.0000 mg | ORAL_TABLET | Freq: Every day | ORAL | Status: DC

## 2014-09-20 MED ORDER — METOPROLOL TARTRATE 25 MG PO TABS: 12.5000 mg | ORAL_TABLET | Freq: Two times a day (BID) | ORAL | Status: AC

## 2014-09-20 MED ORDER — POLYETHYLENE GLYCOL 3350 17 G PO PACK: 17.0000 g | PACK | Freq: Every day | ORAL | Status: DC | PRN

## 2014-09-20 MED ORDER — DOXYCYCLINE HYCLATE 100 MG PO TABS: 100.0000 mg | ORAL_TABLET | Freq: Two times a day (BID) | ORAL | Status: DC

## 2014-09-20 MED ORDER — TRAMADOL HCL 50 MG PO TABS: 50.0000 mg | ORAL_TABLET | Freq: Four times a day (QID) | ORAL | Status: AC | PRN

## 2014-09-20 MED ORDER — TRAZODONE HCL 50 MG PO TABS: 50.0000 mg | ORAL_TABLET | Freq: Every evening | ORAL | Status: AC | PRN

## 2014-09-20 MED ORDER — METRONIDAZOLE 500 MG PO TABS: 500.0000 mg | ORAL_TABLET | Freq: Three times a day (TID) | ORAL | Status: DC

## 2014-09-20 NOTE — Progress Notes (Signed)
Clinical Social Work  Attending MD reports that patient is medically stable to DC. CSW spoke with [facility] who reports that patient revoked bed hold and [facility] does not have any bed available at this time. CSW spoke with son Nida Boatman(Brad) and dtr-in-law Aurther Loft(Terry) via home who prefer [facility] or [facility]. Both SNFs were able to offer a bed but [facility] had a private room so family chose that facility. CSW updated patient at bedside and explained that family and MD were recommending SNF placement. Patient upset and reports she would prefer to go back to ALF with husband but understands recommendations.   CSW faxed DC summary to [facility] who can accept after lunch. CSW prepared DC packet with FL2, DNR, DC summary and hard scripts included. RN to call report to SNF. Family prefers [facility] for transportation at DC and aware of time around 1:30pm for pick up. [facility] request #: [id number].  CSW is signing off but available if needed.  [place] Manor[place]olly Rilei [place], [place] [phone]

## 2014-09-20 NOTE — Discharge Instructions (Signed)
Parotitis °Parotitis is soreness and inflammation of one or both parotid glands. The parotid glands produce saliva. They are located on each side of the face, below and in front of the earlobes. The saliva produced comes out of tiny openings (ducts) inside the cheeks. In most cases, parotitis goes away over time or with treatment. If your parotitis is caused by certain long-term (chronic) diseases, it may come back again.  °CAUSES  °Parotitis can be caused by: °· Viral infections. Mumps is one viral infection that can cause parotitis. °· Bacterial infections. °· Blockage of the salivary ducts due to a salivary stone. °· Narrowing of the salivary ducts. °· Swelling of the salivary ducts. °· Dehydration. °· Autoimmune conditions, such as sarcoidosis or Sjogren syndrome. °· Air from activities such as scuba diving, glass blowing, or playing an instrument (rare). °· Human immunodeficiency virus (HIV) or acquired immunodeficiency syndrome (AIDS). °· Tuberculosis. °SIGNS AND SYMPTOMS  °· The ears may appear to be pushed up and out from their normal position. °· Redness (erythema) of the skin over the parotid glands. °· Pain and tenderness over the parotid glands. °· Swelling in the parotid gland area. °· Yellowish-white fluid (pus) coming from the ducts inside the cheeks. °· Dry mouth. °· Bad taste in the mouth. °DIAGNOSIS  °Your health care provider may determine that you have parotitis based on your symptoms and a physical exam. A sample of fluid may also be taken from the parotid gland and tested to find the cause of your infection. X-rays or computed tomography (CT) scans may be taken if your health care provider thinks you might have a salivary stone blocking your salivary duct. °TREATMENT  °Treatment varies depending upon the cause of your parotitis. If your parotitis is caused by mumps, no treatment is needed. The condition will go away on its own after 7 to 10 days. In other cases, treatment may  include: °· Antibiotic medicine if your infection was caused by bacteria. °· Pain medicines. °· Gland massage. °· Eating sour candy to increase your saliva production. °· Removal of salivary stones. Your health care provider may flush stones out with fluids or remove them with tweezers. °· Surgery to remove the parotid glands. °HOME CARE INSTRUCTIONS  °· If you were prescribed an antibiotic medicine, finish it all even if you start to feel better. °· Put warm compresses on the sore area. °· Take medicines only as directed by your health care provider. °· Drink enough fluids to keep your urine clear or pale yellow. °SEEK IMMEDIATE MEDICAL CARE IF:  °· You have increasing pain or swelling that is not controlled with medicine. °· You have a fever. °MAKE SURE YOU: °· Understand these instructions. °· Will watch your condition. °· Will get help right away if you are not doing well or get worse. °Document Released: 02/13/2002 Document Revised: 01/08/2014 Document Reviewed: 07/20/2011 °ExitCare® Patient Information ©2015 [employer]. This information is not intended to replace advice given to you by your health care provider. Make sure you discuss any questions you have with your health care provider. ° °

## 2014-09-20 NOTE — Progress Notes (Signed)
Report called to Joy at [facility]. Informed RN that patient's foley was DC'ed at 1245 and that the patient was DTV. RN denied further questions or concerns at this time. Patient transported to [facility] via [facility]. Family notified of transport to facility by Child psychotherapistsocial worker.

## 2014-09-20 NOTE — Discharge Summary (Signed)
[facility]  Physician Discharge Summary   Patient ID: Kaitlyn Burns MRN: [id number] DOB/AGE: [birth year] 81 y.o.  Admit date: 09/11/2014 Discharge date: 09/20/2014  PCP: No primary care provider on file.  DISCHARGE DIAGNOSES:  Principal Problem:   Parotitis Active Problems:   Gout   Hypertension   Diabetes   GERD (gastroesophageal reflux disease)   Diabetes mellitus   Sepsis   Elevated troponin I level   RECOMMENDATIONS FOR OUTPATIENT FOLLOW UP: 1. Change to BP med dose. Please monitor BP closely. 2. Check CBG's. Lantus on hold due to borderline low glucose levels.  3. Fentanyl has been stopped as well. See below. Please assess pain periodically.  DISCHARGE CONDITION: fair  Diet recommendation: Mod Carb  Filed Weights   09/12/14 1100  Weight: 60 kg (132 lb 4.4 oz)    INITIAL HISTORY: 81 year old lady recently discharged from the hospital after being treated for gi bleed, alcohol withdrawal and acute encephalopathy presented with parotitis, and urinary tract infection. She was also noted to have mildly elevated troponin. She was admitted for further management.  Consultants:  Cardiology  Speech therapy  Physical therapy.  Procedures: 2D ECHO Study Conclusions - Left ventricle: The cavity size was normal. There was mild focalbasal hypertrophy of the septum. Systolic function was normal.The estimated ejection fraction was in the range of 55% to 60%.Wall motion was normal; there were no regional wall motionabnormalities. Doppler parameters are consistent with abnormalleft ventricular relaxation (grade 1 diastolic dysfunction).Doppler parameters are consistent with high ventricular fillingpressure. - Mitral valve: Moderately calcified annulus. There was moderateregurgitation. - Left atrium: The atrium was mildly dilated. - Pulmonary arteries: Systolic pressure was moderately increased.PA peak pressure: 56 mm Hg (S).  Antibiotics:  IV vancomycin and  IV aztreonam. 1/5-1/13  Doxy and Flagyl 1/13-->  HOSPITAL COURSE:   Sepsis secondary to Parotitis and UTI Sepsis criteria met on admission with tachycardia, tachypnea, leukocytosis and evidence of infection based on maxillofacial CT and UA. Patient was started on broad-spectrum antibiotic coverage with vancomycin and aztreonam. Patient has slowly improved. White count has improved from a peaks of 30's.   E Coli UTI Urine cultures grew Escherichia coli which was pansensitive. Patient completed treatment with 8 days of aztreonam. She has allergy to multiple antibiotics. Hence, IV antibiotics were continued.   Acute Parotitis This was seen on CT scan. Patient was started on vancomycin for presumed staph. Patient has improved. Her WBC is improved and she is afebrile. Still has tenderness on the parotid glands. She was changed over to oral doxycycline and Flagyl. Blood cultures have been negative. She will take these for 7 more days.  Chronic Gout Continue allopurinol.   Essential Hypertension Blood pressure has been stable but borderline low. BB was added due to elevated troponins. Will decrease dose of Diltiazem.   Diabetes mellitus type II, controlled Recent A1c 5.8 indicating good glycemic control. CBGs have been well controlled but sometimes borderline low. Will hold Lantus for now. SNF to continue checking CBGs and keep her on SSI alone for now.   GERD (gastroesophageal reflux disease) ContinuePPI.  Chronic renal failure (CKD2-3) Renal function has been stable.   Hypokalemia/Hypomagnesemia Potassium was replaced orally. Mg was replaced as well.   Elevated point-of-care troponin Mild elevation in troponin at 0.04. Likely demand ischemia from sepsis. She underwent an echocardiogram which did not show any wall motion abnormalities. Cardiology has seen the patient and advise no further workup. Low dose BB.  Hypothyroidism  Continue Synthroid  Acute encephalopathy: This  was  likely due to sepsis. This has improved and she is back to baseline. MRI brain was done but did not show any acute process. Global atrophy was noted.  Left upper extremity swelling and redness Probably superficial thrombophlebitis. Improved.   Constipation Started on stool softners. Manual disimpaction had to be done. Laxatives.  Chronic Pain/Alcohol Abuse She was on fentanyl patch when she was admitted. She was taken off of it due to her mental status. She has been off of it for 8 days now and hasn't had significant pain. Tramadol alone has controlled pain well. We will leave her off of Fentanyl patch for now. SNF to assess further. Continue Thiamine.  She is feeling well. Reluctant to go to SNF but understands need for it. Stable for discharge.  PERTINENT LABS:  The results of significant diagnostics from this hospitalization (including imaging, microbiology, ancillary and laboratory) are listed below for reference.    Microbiology: Recent Results (from the past 240 hour(s))  Culture, Urine     Status: None   Collection Time: 09/11/14  3:15 PM  Result Value Ref Range Status   Specimen Description URINE, CATHETERIZED  Final   Special Requests NONE  Final   Colony Count   Final    >=100,000 COLONIES/ML Performed at [facility]    Culture   Final    ESCHERICHIA COLI Performed at [facility]    Report Status 09/13/2014 FINAL  Final   Organism ID, Bacteria ESCHERICHIA COLI  Final      Susceptibility   Escherichia coli - MIC*    AMPICILLIN >=32 RESISTANT Resistant     CEFAZOLIN <=4 SENSITIVE Sensitive     CEFTRIAXONE <=1 SENSITIVE Sensitive     CIPROFLOXACIN >=4 RESISTANT Resistant     GENTAMICIN <=1 SENSITIVE Sensitive     LEVOFLOXACIN >=8 RESISTANT Resistant     NITROFURANTOIN <=16 SENSITIVE Sensitive     TOBRAMYCIN <=1 SENSITIVE Sensitive     TRIMETH/SULFA <=20 SENSITIVE Sensitive     PIP/TAZO 8 SENSITIVE Sensitive     * ESCHERICHIA COLI  Culture, blood  (routine x 2)     Status: None   Collection Time: 09/11/14  4:25 PM  Result Value Ref Range Status   Specimen Description Blood  Final   Special Requests NONE  Final   Culture   Final    NO GROWTH 5 DAYS Performed at [facility]    Report Status 09/17/2014 FINAL  Final     Labs: Basic Metabolic Panel:  Recent Labs Lab 09/14/14 0810  09/16/14 0554 09/17/14 1050 09/18/14 1610 09/19/14 0520 09/20/14 0502  NA 136  < > 138 143 139 143 144  K 3.1*  < > 3.2* 3.4* 2.5* 3.1* 4.0  CL 104  < > 103 107 105 109 108  CO2 29  < > 28 28 28 27 28   GLUCOSE 104*  < > 126* 142* 205* 108* 111*  BUN 14  < > 7 <5* 5* <5* 6  CREATININE 0.60  < > 0.53 0.53 0.62 0.58 0.70  CALCIUM 7.7*  < > 7.7* 8.2* 7.6* 7.9* 8.5  MG 1.8  --   --   --  1.3*  --   --   < > = values in this interval not displayed.  CBC:  Recent Labs Lab 09/16/14 0554 09/17/14 1050 09/18/14 0345 09/19/14 0520 09/20/14 0502  WBC 20.5* 16.1* 15.8* 14.5* 16.2*  HGB 10.7* 10.7* 9.8* 9.8* 10.6*  HCT 34.0* 33.0*  30.6* 30.6* 33.7*  MCV 100.6* 99.7 100.3* 98.7 98.8  PLT 304 291 264 302 341   CBG:  Recent Labs Lab 09/19/14 1818 09/19/14 2019 09/20/14 09/20/14 0420 09/20/14 0749  GLUCAP 105* 117* 105* 84 83     IMAGING STUDIES   Mr Brain Wo Contrast  09/13/2014   CLINICAL DATA:  Acute encephalopathy, dementia. Acute parotiditis and sepsis.  EXAM: MRI HEAD WITHOUT CONTRAST  TECHNIQUE: Multiplanar, multiecho pulse sequences of the brain and surrounding structures were obtained without intravenous contrast. Due to reported confusion and difficulty tolerating the examination, coronal T2, axial T1 and gradient sequences not obtained.  COMPARISON:  CT of the neck September 11, 2014 and CT of the head August 26, 2014  FINDINGS: Moderately motion degraded examination. No reduced diffusion to suggest acute ischemia. Moderate ventriculomegaly, likely on the basis of global parenchymal brain volume loss as there is overall  commensurate enlargement of cerebral sulci and cerebellar folia, within normal range for patient's age. No midline shift or mass effect. Moderate suspected white matter changes suggest chronic small vessel ischemic disease.  No abnormal sellar expansion. No cerebellar tonsillar ectopia. Mild no definite bone marrow signal abnormality. Patient appears edentulous.  No abnormal extra-axial fluid collections. Normal major intracranial vascular flow voids seen at the skull base. Trace bilateral mastoid effusions. Complete opacification RIGHT maxillary sinus, in a background of mild paranasal sinus mucosal thickening.  IMPRESSION: Moderately motion degraded examination, incomplete examination due to inability to tolerate further imaging. If clinically indicated, patient could return when better able tolerate further imaging, and addendum would be made to this report.  No acute ischemia.  Involutional changes. Moderate suspected white matter changes suggest chronic small vessel ischemic disease.  Paranasal sinusitis, better demonstrated on prior head CT. Trace bilateral mastoid effusions.   Electronically Signed   By: Elon Alas   On: 09/13/2014 21:31    Ct Maxillofacial W/cm  09/11/2014   CLINICAL DATA:  Facial swelling  EXAM: CT MAXILLOFACIAL WITH CONTRAST  TECHNIQUE: Multidetector CT imaging of the maxillofacial structures was performed with intravenous contrast. Multiplanar CT image reconstructions were also generated. A small metallic BB was placed on the right temple in order to reliably differentiate right from left.  CONTRAST:  169m OMNIPAQUE IOHEXOL 300 MG/ML  SOLN  COMPARISON:  None.  FINDINGS: Right carotid is enlarged and shows diffuse hyper enhancement consistent with parotitis. No underlying mass or abscess. Left parotid gland shows mild enhancement, with overlying skin edema.  Submandibular gland normal in size and enhancement bilaterally.  Tongue and pharynx normal.  Visualized intracranial  contents show no acute abnormality.  Chronic sinusitis right maxillary sinus which is nearly completely opacified with diffuse bony thickening. No acute bony change. Advanced degenerative change in the TMJ bilaterally.  IMPRESSION: Acute parotitis right greater than left without abscess or mass.  Chronic sinusitis right maxillary sinus pain   Electronically Signed   By: CFranchot GalloM.D.   On: 09/11/2014 11:52    DISCHARGE EXAMINATION: Filed Vitals:   09/19/14 0700 09/19/14 1419 09/20/14 0440 09/20/14 0615  BP: 110/45 115/62 130/61 126/68  Pulse:  74 82 78  Temp:  98.3 F (36.8 C) 98 F (36.7 C) 98.3 F (36.8 C)  TempSrc:  Oral Oral Oral  Resp:  18 18 18   Height:      Weight:      SpO2:  96% 100% 98%   General appearance: alert, cooperative, appears stated age and no distress Resp: clear to auscultation bilaterally Cardio:  regular rate and rhythm, S1, S2 normal, no murmur, click, rub or gallop GI: soft, non-tender; bowel sounds normal; no masses,  no organomegaly Extremities: extremities normal, atraumatic, no cyanosis or edema  DISPOSITION: SNF  Discharge Instructions    Diet Carb Modified    Complete by:  As directed      Discharge instructions    Complete by:  As directed   Please check CBG's 3 times daily before meals. Just use sliding scale for now. Lantus being held for now due to borderline low blood glucose and poor oral intake.     Increase activity slowly    Complete by:  As directed            ALLERGIES:  Allergies  Allergen Reactions  . Ciprofloxacin   . Gabapentin     unknown  . Lorazepam     unknown  . Metoclopramide     unknown  . Penicillins     unknown  . Tape     unknown  . Teflaro [Ceftaroline]     unknown     Current Discharge Medication List    START taking these medications   Details  doxycycline (VIBRA-TABS) 100 MG tablet Take 1 tablet (100 mg total) by mouth every 12 (twelve) hours. For 7 more days.    feeding supplement,  RESOURCE BREEZE, (RESOURCE BREEZE) LIQD Take 1 Container by mouth 3 (three) times daily between meals. Refills: 0    metoprolol tartrate (LOPRESSOR) 25 MG tablet Take 0.5 tablets (12.5 mg total) by mouth 2 (two) times daily.    metroNIDAZOLE (FLAGYL) 500 MG tablet Take 1 tablet (500 mg total) by mouth every 8 (eight) hours. For 7 more days.    polyethylene glycol (MIRALAX / GLYCOLAX) packet Take 17 g by mouth daily as needed for moderate constipation. Qty: 14 each, Refills: 0    thiamine 100 MG tablet Take 1 tablet (100 mg total) by mouth daily.      CONTINUE these medications which have CHANGED   Details  diltiazem (DILACOR XR) 120 MG 24 hr capsule Take 1 capsule (120 mg total) by mouth daily.    traMADol (ULTRAM) 50 MG tablet Take 1 tablet (50 mg total) by mouth every 6 (six) hours as needed. Qty: 30 tablet, Refills: 0    traZODone (DESYREL) 50 MG tablet Take 1 tablet (50 mg total) by mouth at bedtime as needed for sleep. Qty: 30 tablet, Refills: 0      CONTINUE these medications which have NOT CHANGED   Details  allopurinol (ZYLOPRIM) 100 MG tablet Take 100 mg by mouth 2 (two) times daily.    Amino Acids-Protein Hydrolys (FEEDING SUPPLEMENT, PRO-STAT SUGAR FREE 64,) LIQD Take 30 mLs by mouth daily.    calcium-vitamin D (OSCAL WITH D) 500-200 MG-UNIT per tablet Take 1 tablet by mouth 2 (two) times daily.     cholecalciferol (VITAMIN D) 1000 UNITS tablet Take 1,000 Units by mouth daily.    divalproex (DEPAKOTE ER) 250 MG 24 hr tablet Take 250-500 mg by mouth 2 (two) times daily. 1 tablet in the am and 2 tablets in the pm    ferrous sulfate 325 (65 FE) MG tablet Take 1 tablet (325 mg total) by mouth 3 (three) times daily with meals. Qty: 90 tablet, Refills: 0    folic acid (FOLVITE) 1 MG tablet Take 1 tablet (1 mg total) by mouth daily. Qty: 30 tablet, Refills: 2    hydrocortisone (ANUSOL-HC) 25 MG suppository Place 25 mg rectally  2 (two) times daily as needed for  hemorrhoids.     insulin aspart (NOVOLOG) 100 UNIT/ML injection Inject 1-9 Units into the skin 3 (three) times daily before meals. Sliding scale: 121-150= 1 unit; 151-200= 2 units; 201-250= 3 units; 251-300= 5 units; 301-350= 7 units; 351-400= 9 units    latanoprost (XALATAN) 0.005 % ophthalmic solution Place 1 drop into both eyes at bedtime.    levothyroxine (SYNTHROID, LEVOTHROID) 75 MCG tablet Take 75 mcg by mouth daily before breakfast.    magnesium oxide (MAG-OX) 400 (241.3 MG) MG tablet Take 1 tablet (400 mg total) by mouth 2 (two) times daily. Qty: 60 tablet, Refills: 2    Multiple Vitamins-Minerals (THERA-M PO) Take 1 tablet by mouth 2 (two) times daily.    omeprazole (PRILOSEC) 20 MG capsule Take 20 mg by mouth daily.    psyllium (REGULOID) 0.52 G capsule Take 0.52 g by mouth at bedtime.      STOP taking these medications     fentaNYL (DURAGESIC - DOSED MCG/HR) 25 MCG/HR patch      insulin glargine (LANTUS) 100 UNIT/ML injection      pantoprazole (PROTONIX) 40 MG tablet      LORazepam (ATIVAN) 0.5 MG tablet          TOTAL DISCHARGE TIME: 35 mins  [facility] Pager [phone]  09/20/2014, 10:43 AM

## 2014-09-20 NOTE — Progress Notes (Signed)
Clinical Social Work Department CLINICAL SOCIAL WORK PLACEMENT NOTE 09/20/2014  Patient:  Kaitlyn Burns,Kaitlyn Burns  Account Number:  [id number] Admit date:  09/11/2014  Clinical Social Worker:  Unk LightningHOLLY Teondre Jarosz, LCSW  Date/time:  09/20/2014 12:00 N  Clinical Social Work is seeking post-discharge placement for this patient at the following level of care:   SKILLED NURSING   (*CSW will update this form in Epic as items are completed)   09/20/2014  [facility]family provided with [facility] of Clinical Social Work's list of facilities offering this level of care within the geographic area requested by the patient (or if unable, by the patient's family).  09/20/2014  Patient/family informed of their freedom to choose among providers that offer the needed level of care, that participate in Medicare, Medicaid or managed care program needed by the patient, have an available bed and are willing to accept the patient.  09/20/2014  Patient/family informed of [facility]' ownership interest in [facility], as well as of the fact that they are under no obligation to receive care at this facility.  PASARR submitted to EDS on 09/13/2014 PASARR number received on 09/13/2014  FL2 transmitted to all facilities in geographic area requested by pt/family on  09/20/2014 FL2 transmitted to all facilities within larger geographic area on   Patient informed that his/her managed care company has contracts with or will negotiate with  certain facilities, including the following:     Patient/family informed of bed offers received:  09/20/2014 [facility] [place] Physician recommends and patient chooses bed at    [facility] [place] on  09/20/2014 Patient to be transferred to facility by [facility] Patient and family notified of transfer on 09/20/2014 Name of family member notified:  Aurther Lofterry (Dtr-in-law) and son Nida Boatman(Brad) via  phone  The following physician request were entered in Epic:   Additional Comments:

## 2015-04-16 ENCOUNTER — Encounter (HOSPITAL_COMMUNITY): Payer: Self-pay | Admitting: *Deleted

## 2015-04-16 ENCOUNTER — Emergency Department (HOSPITAL_COMMUNITY): Payer: Medicare Other

## 2015-04-16 ENCOUNTER — Emergency Department (HOSPITAL_COMMUNITY)
Admission: EM | Admit: 2015-04-16 | Discharge: 2015-04-17 | Disposition: A | Discharge status: Home / Self Care | Payer: Medicare Other | Source: Home / Self Care | Attending: Emergency Medicine | Admitting: Emergency Medicine

## 2015-04-16 DIAGNOSIS — E876 Hypokalemia: Secondary | ICD-10-CM | POA: Insufficient documentation

## 2015-04-16 DIAGNOSIS — E119 Type 2 diabetes mellitus without complications: Secondary | ICD-10-CM | POA: Insufficient documentation

## 2015-04-16 DIAGNOSIS — N390 Urinary tract infection, site not specified: Secondary | ICD-10-CM

## 2015-04-16 DIAGNOSIS — E079 Disorder of thyroid, unspecified: Secondary | ICD-10-CM | POA: Insufficient documentation

## 2015-04-16 DIAGNOSIS — Z791 Long term (current) use of non-steroidal anti-inflammatories (NSAID): Secondary | ICD-10-CM | POA: Insufficient documentation

## 2015-04-16 DIAGNOSIS — R112 Nausea with vomiting, unspecified: Secondary | ICD-10-CM

## 2015-04-16 DIAGNOSIS — E860 Dehydration: Secondary | ICD-10-CM | POA: Insufficient documentation

## 2015-04-16 DIAGNOSIS — M1990 Unspecified osteoarthritis, unspecified site: Secondary | ICD-10-CM | POA: Insufficient documentation

## 2015-04-16 DIAGNOSIS — Z880 Allergy status to penicillin: Secondary | ICD-10-CM | POA: Insufficient documentation

## 2015-04-16 DIAGNOSIS — Z79899 Other long term (current) drug therapy: Secondary | ICD-10-CM | POA: Insufficient documentation

## 2015-04-16 DIAGNOSIS — M549 Dorsalgia, unspecified: Secondary | ICD-10-CM | POA: Insufficient documentation

## 2015-04-16 LAB — BASIC METABOLIC PANEL
Anion gap: 12 (ref 5–15)
BUN: 37 mg/dL — ABNORMAL HIGH (ref 6–20)
CO2: 26 mmol/L (ref 22–32)
CREATININE: 1.31 mg/dL — AB (ref 0.44–1.00)
Calcium: 10.6 mg/dL — ABNORMAL HIGH (ref 8.9–10.3)
Chloride: 102 mmol/L (ref 101–111)
GFR calc Af Amer: 41 mL/min — ABNORMAL LOW (ref >60)
GFR calc non Af Amer: 35 mL/min — ABNORMAL LOW (ref >60)
GLUCOSE: 142 mg/dL — AB (ref 65–99)
Potassium: 3.1 mmol/L — ABNORMAL LOW (ref 3.5–5.1)
Sodium: 140 mmol/L (ref 135–145)

## 2015-04-16 LAB — URINALYSIS, ROUTINE W REFLEX MICROSCOPIC
Bilirubin Urine: NEGATIVE
GLUCOSE, UA: NEGATIVE mg/dL
Hgb urine dipstick: NEGATIVE
Ketones, ur: NEGATIVE mg/dL
Nitrite: NEGATIVE
Protein, ur: 30 mg/dL — AB
SPECIFIC GRAVITY, URINE: 1.022 (ref 1.005–1.030)
Urobilinogen, UA: 0.2 mg/dL (ref 0.0–1.0)
pH: 5.5 (ref 5.0–8.0)

## 2015-04-16 LAB — CBC WITH DIFFERENTIAL/PLATELET
Basophils Absolute: 0 10*3/uL (ref 0.0–0.1)
Basophils Relative: 0 % (ref 0–1)
Eosinophils Absolute: 0.2 10*3/uL (ref 0.0–0.7)
Eosinophils Relative: 1 % (ref 0–5)
HCT: 38.1 % (ref 36.0–46.0)
Hemoglobin: 12.5 g/dL (ref 12.0–15.0)
LYMPHS PCT: 13 % (ref 12–46)
Lymphs Abs: 1.8 10*3/uL (ref 0.7–4.0)
MCH: 31.3 pg (ref 26.0–34.0)
MCHC: 32.8 g/dL (ref 30.0–36.0)
MCV: 95.5 fL (ref 78.0–100.0)
MONO ABS: 1 10*3/uL (ref 0.1–1.0)
Monocytes Relative: 7 % (ref 3–12)
NEUTROS PCT: 79 % — AB (ref 43–77)
Neutro Abs: 10.9 10*3/uL — ABNORMAL HIGH (ref 1.7–7.7)
Platelets: 250 10*3/uL (ref 150–400)
RBC: 3.99 MIL/uL (ref 3.87–5.11)
RDW: 15.4 % (ref 11.5–15.5)
WBC: 13.9 10*3/uL — ABNORMAL HIGH (ref 4.0–10.5)

## 2015-04-16 LAB — URINE MICROSCOPIC-ADD ON

## 2015-04-16 LAB — TROPONIN I: Troponin I: <0.03 ng/mL (ref <0.031)

## 2015-04-16 NOTE — ED Notes (Signed)
Pt is DNR from [facility],  She is here for sudden onset nausea and vomiting, and back pain, denies chest pain or any other complaints,  12 lead by EMS is unremarkable,  EMS placed 20 gauge LAC and gave Zofran 4 MG IV   Pt is alert and oriented in NAD

## 2015-04-16 NOTE — ED Notes (Signed)
Bed: [facility] Expected date:  Expected time:  Means of arrival:  Comments: EMS 81 yo female from assisted living-nausea and vomiting x 2 hours/chest tightness/IV Zofran

## 2015-04-16 NOTE — Progress Notes (Signed)
CSW met with patient at bedside. Son and daughter in law were present. Family states that patient is from [facility] and has been a resident there for 1 and a half years. Family state that patient is currently living in the facilities assisted living unit.   Patient states that she receives assistance with completing her ADL's. Patient states that she does not fall often.    Patient confirms that she presents to [facility] due to nausea. Also, patient states that she has pain her upper R chest and ribs.  Patient informed CSW that she feels safe returning to the facility. Family informed CSW that they feel patient is at the appropriate level of care.   Son states that he lives in [place], and that he and his brother are both POA for patient. He states that the patient's other son is local.  Son/Bob Hillenburg [phone]  Willette Brace [phone] ED CSW 04/16/2015 9:30 PM

## 2015-04-16 NOTE — ED Provider Notes (Signed)
CSN: [id number]     Arrival date & time 04/16/15  2020 History   First MD Initiated Contact with Patient 04/16/15 2138     Chief Complaint  Patient presents with  . Nausea  . Emesis  . Back Pain     (Consider location/radiation/quality/duration/timing/severity/associated sxs/prior Treatment) Patient is a 81 y.o. female presenting with vomiting and back pain. The history is provided by the patient.  Emesis Back Pain   Kaitlyn Burns is a 81 y.o. female who presents for evaluation of vomiting, which started around 5 PM tonight. She vomited several times and then an ambulance was summoned. They treated with Zofran, with cessation of the vomiting. She had a normal bowel movement this morning. She has had some diarrhea recently and received an antidiarrhea medication. There's been no fever, cough, weakness or dizziness. She had some tightness in her upper back following the vomiting, but that has resolved. She has not had anterior chest pain. She ate breakfast and lunch today. There are no known sick contacts. She is here with family members. There are no there are no modifying factors.   Past Medical History  Diagnosis Date  . Diabetes mellitus   . Thyroid disease   . Complication of anesthesia     trouble intubating surgery  . Difficult intubation   . Arthritis   . UTI (lower urinary tract infection)    Past Surgical History  Procedure Laterality Date  . Abdominal hysterectomy    . Hemorroidectomy    . Medial partial knee replacement    . Thyroidectomy, partial    . Shoulder surgery Right     "had shredded a muscle"  . Achilles tendon surgery Left   . Foot surgery Left     left heel infection that went to bone per spouse  . Lower abdominel surgery    . Esophagogastroduodenoscopy (egd) with propofol N/A 08/28/2014    Procedure: ESOPHAGOGASTRODUODENOSCOPY (EGD) WITH PROPOFOL;  Surgeon: Louis Meckel, MD;  Location: [facility];  Service: Endoscopy;  Laterality: N/A;  .  Colonoscopy N/A 08/29/2014    Procedure: COLONOSCOPY;  Surgeon: Louis Meckel, MD;  Location: [facility];  Service: Endoscopy;  Laterality: N/A;   No family history on file. Social History  Substance Use Topics  . Smoking status: Never Smoker   . Smokeless tobacco: Never Used  . Alcohol Use: Yes     Comment: red wine with evening meal   OB History    No data available     Review of Systems  Gastrointestinal: Positive for vomiting.  Musculoskeletal: Positive for back pain.  All other systems reviewed and are negative.     Allergies  Ciprofloxacin; Lorazepam; Teflaro; Gabapentin; Metoclopramide; Penicillins; and Tape  Home Medications   Prior to Admission medications   Medication Sig Start Date End Date Taking? Authorizing Provider  acetaminophen (TYLENOL) 325 MG tablet Take 650 mg by mouth every 4 (four) hours as needed (for pain).   Yes Historical Provider, MD  Amino Acids-Protein Hydrolys (FEEDING SUPPLEMENT, PRO-STAT SUGAR FREE 64,) LIQD Take 30 mLs by mouth 2 (two) times daily.    Yes Historical Provider, MD  amitriptyline (ELAVIL) 10 MG tablet Take 10 mg by mouth at bedtime.   Yes Historical Provider, MD  Calcium Carb-Cholecalciferol (CALCIUM 500+D) 500-200 MG-UNIT TABS Take 1 tablet by mouth daily.   Yes Historical Provider, MD  capsaicin (ZOSTRIX) 0.025 % cream Apply 1 application topically at bedtime. Applied to bottoms of feet for pain.   Yes Historical  Provider, MD  diclofenac sodium (VOLTAREN) 1 % GEL Apply 2 g topically 4 (four) times daily. Applied to lower back for back pain.   Yes Historical Provider, MD  diltiazem (DILACOR XR) 120 MG 24 hr capsule Take 1 capsule (120 mg total) by mouth daily. 09/20/14  Yes Osvaldo Shipper, MD  diphenhydrAMINE-zinc acetate (BENADRYL) cream Apply 1 application topically at bedtime. Applied to bilateral legs, abdomin, & back prior to bedtime for itching.   Yes Historical Provider, MD  divalproex (DEPAKOTE ER) 250 MG 24 hr tablet  Take 250 mg by mouth 2 (two) times daily.    Yes Historical Provider, MD  DULoxetine (CYMBALTA) 20 MG capsule Take 20 mg by mouth every Monday, Wednesday, and Friday.   Yes Historical Provider, MD  ENSURE PLUS (ENSURE PLUS) LIQD Take 237 mLs by mouth 2 (two) times daily between meals. Ensure Plus Vanilla   Yes Historical Provider, MD  febuxostat (ULORIC) 40 MG tablet Take 40 mg by mouth daily.   Yes Historical Provider, MD  furosemide (LASIX) 40 MG tablet Take 40 mg by mouth every Monday, Wednesday, and Friday.   Yes Historical Provider, MD  hydrocortisone (ANUSOL-HC) 25 MG suppository Place 25 mg rectally 2 (two) times daily as needed for hemorrhoids.  09/09/13  Yes Historical Provider, MD  insulin aspart (NOVOLOG) 100 UNIT/ML injection Inject 1-9 Units into the skin 3 (three) times daily before meals. Sliding scale: 121-150= 1 unit; 151-200= 2 units; 201-250= 3 units; 251-300= 5 units; 301-350= 7 units; 351-400= 9 units   Yes Historical Provider, MD  latanoprost (XALATAN) 0.005 % ophthalmic solution Place 1 drop into both eyes at bedtime.   Yes Historical Provider, MD  levothyroxine (SYNTHROID, LEVOTHROID) 75 MCG tablet Take 75 mcg by mouth daily before breakfast.   Yes Historical Provider, MD  Liniments (SALONPAS PAIN RELIEF PATCH) PADS Apply 1 each topically at bedtime. Applied to lower back at bedtime & remove in the morning   Yes Historical Provider, MD  magnesium oxide (MAG-OX) 400 MG tablet Take 400 mg by mouth daily.   Yes Historical Provider, MD  meloxicam (MOBIC) 7.5 MG tablet Take 7.5 mg by mouth every evening.   Yes Historical Provider, MD  Menthol (BIOFREEZE) 10 % AERO Apply 1 spray topically 3 (three) times daily. Applied to right knee for pain.   Yes Historical Provider, MD  Multiple Vitamin (MULTIVITAMIN WITH MINERALS) TABS tablet Take 1 tablet by mouth daily.   Yes Historical Provider, MD  nystatin-triamcinolone (MYCOLOG II) cream Apply 1 application topically 2 (two) times daily.  Applied before and after tedhose   Yes Historical Provider, MD  ondansetron (ZOFRAN) 4 MG tablet Take 4 mg by mouth every 8 (eight) hours as needed for nausea or vomiting.   Yes Historical Provider, MD  polyethylene glycol (MIRALAX / GLYCOLAX) packet Take 17 g by mouth daily as needed for moderate constipation. 09/20/14  Yes Osvaldo Shipper, MD  polyvinyl alcohol (LIQUIFILM TEARS) 1.4 % ophthalmic solution Place 1 drop into both eyes as directed. 6 times daily as needed for dry eyes.   Yes Historical Provider, MD  potassium chloride SA (K-DUR,KLOR-CON) 20 MEQ tablet Take 20 mEq by mouth every Monday, Wednesday, and Friday.   Yes Historical Provider, MD  Pramox-PE-Glycerin-Petrolatum (PREPARATION H) 1-0.25-14.4-15 % CREA Place 1 application rectally 2 (two) times daily.   Yes Historical Provider, MD  QUEtiapine (SEROQUEL) 50 MG tablet Take 50 mg by mouth 2 (two) times daily.   Yes Historical Provider, MD  traMADol Janean Sark) 50  MG tablet Take 1 tablet (50 mg total) by mouth every 6 (six) hours as needed. 09/20/14  Yes Osvaldo Shipper, MD  traZODone (DESYREL) 50 MG tablet Take 1 tablet (50 mg total) by mouth at bedtime as needed for sleep. 09/20/14  Yes Osvaldo Shipper, MD  cephALEXin (KEFLEX) 250 MG capsule Take 1 capsule (250 mg total) by mouth 4 (four) times daily. 04/17/15   Mancel Bale, MD  metoprolol tartrate (LOPRESSOR) 25 MG tablet Take 0.5 tablets (12.5 mg total) by mouth 2 (two) times daily. Patient not taking: Reported on 04/16/2015 09/20/14   Osvaldo Shipper, MD  potassium chloride SA (K-DUR,KLOR-CON) 20 MEQ tablet Take 1 tablet (20 mEq total) by mouth 2 (two) times daily. 04/17/15   Mancel Bale, MD   BP 131/65 mmHg  Pulse 92  Temp(Src) 98 F (36.7 C) (Oral)  Resp 16  SpO2 100% Physical Exam  Constitutional: She is oriented to person, place, and time. She appears well-developed.  Elderly, frail  HENT:  Head: Normocephalic and atraumatic.  Right Ear: External ear normal.  Left Ear: External  ear normal.  Eyes: Conjunctivae and EOM are normal. Pupils are equal, round, and reactive to light.  Neck: Normal range of motion and phonation normal. Neck supple.  Cardiovascular: Normal rate, regular rhythm and normal heart sounds.   Pulmonary/Chest: Effort normal and breath sounds normal. She exhibits no bony tenderness.  Abdominal: Soft. She exhibits no distension and no mass. There is tenderness (Suprapubic, mild). There is no rebound and no guarding.  Musculoskeletal: Normal range of motion. She exhibits no edema.  Neurological: She is alert and oriented to person, place, and time. No cranial nerve deficit or sensory deficit. She exhibits normal muscle tone. Coordination normal.  Skin: Skin is warm, dry and intact.  Psychiatric: She has a normal mood and affect. Her behavior is normal. Judgment and thought content normal.  Nursing note and vitals reviewed.   ED Course  Procedures (including critical care time)  Medications - No data to display  Patient Vitals for the past 24 hrs:  BP Temp Temp src Pulse Resp SpO2  04/17/15 0000 131/65 mmHg - - 92 16 100 %  04/16/15 2240 173/78 mmHg 98 F (36.7 C) Oral 101 17 100 %  04/16/15 2030 164/81 mmHg 98.3 F (36.8 C) Oral 99 18 100 %    11:49 PM Reevaluation with update and discussion. After initial assessment and treatment, an updated evaluation reveals she has been easily drinking water here. Findings discussed with patient and family members, all questions answered. Rendi Mapel L    Labs Review Labs Reviewed  URINALYSIS, ROUTINE W REFLEX MICROSCOPIC (NOT AT [facility]) - Abnormal; Notable for the following:    Protein, ur 30 (*)    Leukocytes, UA SMALL (*)    All other components within normal limits  BASIC METABOLIC PANEL - Abnormal; Notable for the following:    Potassium 3.1 (*)    Glucose, Bld 142 (*)    BUN 37 (*)    Creatinine, Ser 1.31 (*)    Calcium 10.6 (*)    GFR calc non Af Amer 35 (*)    GFR calc Af Amer 41 (*)     All other components within normal limits  CBC WITH DIFFERENTIAL/PLATELET - Abnormal; Notable for the following:    WBC 13.9 (*)    Neutrophils Relative % 79 (*)    Neutro Abs 10.9 (*)    All other components within normal limits  URINE MICROSCOPIC-ADD ON - Abnormal;  Notable for the following:    Squamous Epithelial / LPF FEW (*)    Crystals CA OXALATE CRYSTALS (*)    All other components within normal limits  URINE CULTURE  TROPONIN I    Imaging Review Dg Chest Port 1 View  04/16/2015   CLINICAL DATA:  Acute onset of nausea and right upper chest and rib pain. Generalized weakness. Initial encounter.  EXAM: PORTABLE CHEST - 1 VIEW  COMPARISON:  Chest radiograph from 08/31/2014  FINDINGS: The lungs are well-aerated and clear. There is no evidence of focal opacification, pleural effusion or pneumothorax.  The cardiomediastinal silhouette is within normal limits. No acute osseous abnormalities are seen.  IMPRESSION: No acute cardiopulmonary process seen. No displaced rib fractures identified.   Electronically Signed   By: Roanna Raider M.D.   On: 04/16/2015 22:28     EKG Interpretation None      MDM   Final diagnoses:  Nausea and vomiting, vomiting of unspecified type  Dehydration  Hypokalemia  UTI (lower urinary tract infection)      Transient vomiting, improved in ED. Pt is tolerating oral liquids. Mild AKI with dehydration and low K+ d/t vomiting. Pt. Desires to try home-treatment. He family supports this plan.  Nursing Notes Reviewed/ Care Coordinated Applicable Imaging Reviewed Interpretation of Laboratory Data incorporated into ED treatment  The patient appears reasonably screened and/or stabilized for discharge and I doubt any other medical condition or other [facility] requiring further screening, evaluation, or treatment in the ED at this time prior to discharge.  Plan: Home Medications- increase K-dur 2X for 3 days.; Home Treatments- push fluids, add 1 L per day; return  here if the recommended treatment, does not improve the symptoms; Recommended follow up- PCP check 3 days    Mancel Bale, MD 04/17/15 [phone]

## 2015-04-17 ENCOUNTER — Encounter (HOSPITAL_COMMUNITY): Payer: Self-pay | Admitting: Emergency Medicine

## 2015-04-17 ENCOUNTER — Inpatient Hospital Stay (HOSPITAL_COMMUNITY)
Admission: EM | Admit: 2015-04-17 | Discharge: 2015-04-20 | DRG: 641 | Disposition: A | Discharge status: Skilled Nursing Facility | Payer: Medicare Other | Attending: Internal Medicine | Admitting: Internal Medicine

## 2015-04-17 DIAGNOSIS — E119 Type 2 diabetes mellitus without complications: Secondary | ICD-10-CM

## 2015-04-17 DIAGNOSIS — Z66 Do not resuscitate: Secondary | ICD-10-CM | POA: Diagnosis present

## 2015-04-17 DIAGNOSIS — E860 Dehydration: Principal | ICD-10-CM | POA: Diagnosis present

## 2015-04-17 DIAGNOSIS — W010XXA Fall on same level from slipping, tripping and stumbling without subsequent striking against object, initial encounter: Secondary | ICD-10-CM | POA: Diagnosis present

## 2015-04-17 DIAGNOSIS — E039 Hypothyroidism, unspecified: Secondary | ICD-10-CM | POA: Diagnosis present

## 2015-04-17 DIAGNOSIS — Z79899 Other long term (current) drug therapy: Secondary | ICD-10-CM

## 2015-04-17 DIAGNOSIS — Z91010 Allergy to peanuts: Secondary | ICD-10-CM

## 2015-04-17 DIAGNOSIS — N390 Urinary tract infection, site not specified: Secondary | ICD-10-CM | POA: Diagnosis present

## 2015-04-17 DIAGNOSIS — Z9109 Other allergy status, other than to drugs and biological substances: Secondary | ICD-10-CM

## 2015-04-17 DIAGNOSIS — M109 Gout, unspecified: Secondary | ICD-10-CM | POA: Diagnosis present

## 2015-04-17 DIAGNOSIS — I10 Essential (primary) hypertension: Secondary | ICD-10-CM | POA: Diagnosis present

## 2015-04-17 DIAGNOSIS — Z881 Allergy status to other antibiotic agents status: Secondary | ICD-10-CM

## 2015-04-17 DIAGNOSIS — I959 Hypotension, unspecified: Secondary | ICD-10-CM | POA: Diagnosis present

## 2015-04-17 DIAGNOSIS — R110 Nausea: Secondary | ICD-10-CM

## 2015-04-17 DIAGNOSIS — Z993 Dependence on wheelchair: Secondary | ICD-10-CM

## 2015-04-17 DIAGNOSIS — E861 Hypovolemia: Secondary | ICD-10-CM | POA: Diagnosis present

## 2015-04-17 DIAGNOSIS — Z79891 Long term (current) use of opiate analgesic: Secondary | ICD-10-CM

## 2015-04-17 DIAGNOSIS — M1990 Unspecified osteoarthritis, unspecified site: Secondary | ICD-10-CM | POA: Diagnosis present

## 2015-04-17 DIAGNOSIS — E876 Hypokalemia: Secondary | ICD-10-CM | POA: Diagnosis present

## 2015-04-17 DIAGNOSIS — Z794 Long term (current) use of insulin: Secondary | ICD-10-CM

## 2015-04-17 LAB — URINALYSIS, ROUTINE W REFLEX MICROSCOPIC
Bilirubin Urine: NEGATIVE
Glucose, UA: NEGATIVE mg/dL
Ketones, ur: NEGATIVE mg/dL
NITRITE: NEGATIVE
PH: 5.5 (ref 5.0–8.0)
PROTEIN: NEGATIVE mg/dL
Specific Gravity, Urine: 1.013 (ref 1.005–1.030)
Urobilinogen, UA: 0.2 mg/dL (ref 0.0–1.0)

## 2015-04-17 LAB — CBC WITH DIFFERENTIAL/PLATELET
BASOS PCT: 0 % (ref 0–1)
Basophils Absolute: 0 10*3/uL (ref 0.0–0.1)
EOS ABS: 0.2 10*3/uL (ref 0.0–0.7)
EOS PCT: 2 % (ref 0–5)
HCT: 31.3 % — ABNORMAL LOW (ref 36.0–46.0)
Hemoglobin: 10.4 g/dL — ABNORMAL LOW (ref 12.0–15.0)
LYMPHS ABS: 2.3 10*3/uL (ref 0.7–4.0)
Lymphocytes Relative: 23 % (ref 12–46)
MCH: 31.3 pg (ref 26.0–34.0)
MCHC: 33.2 g/dL (ref 30.0–36.0)
MCV: 94.3 fL (ref 78.0–100.0)
Monocytes Absolute: 1.2 10*3/uL — ABNORMAL HIGH (ref 0.1–1.0)
Monocytes Relative: 12 % (ref 3–12)
Neutro Abs: 6.2 10*3/uL (ref 1.7–7.7)
Neutrophils Relative %: 63 % (ref 43–77)
PLATELETS: 197 10*3/uL (ref 150–400)
RBC: 3.32 MIL/uL — ABNORMAL LOW (ref 3.87–5.11)
RDW: 15.1 % (ref 11.5–15.5)
WBC: 10 10*3/uL (ref 4.0–10.5)

## 2015-04-17 LAB — URINE MICROSCOPIC-ADD ON

## 2015-04-17 LAB — BASIC METABOLIC PANEL
ANION GAP: 10 (ref 5–15)
BUN: 33 mg/dL — ABNORMAL HIGH (ref 6–20)
CALCIUM: 9.2 mg/dL (ref 8.9–10.3)
CO2: 25 mmol/L (ref 22–32)
Chloride: 102 mmol/L (ref 101–111)
Creatinine, Ser: 1.31 mg/dL — ABNORMAL HIGH (ref 0.44–1.00)
GFR calc Af Amer: 41 mL/min — ABNORMAL LOW (ref >60)
GFR calc non Af Amer: 35 mL/min — ABNORMAL LOW (ref >60)
Glucose, Bld: 117 mg/dL — ABNORMAL HIGH (ref 65–99)
Potassium: 3.2 mmol/L — ABNORMAL LOW (ref 3.5–5.1)
SODIUM: 137 mmol/L (ref 135–145)

## 2015-04-17 LAB — I-STAT TROPONIN, ED: Troponin i, poc: 0.02 ng/mL (ref 0.00–0.08)

## 2015-04-17 MED ORDER — SODIUM CHLORIDE 0.9 % IV BOLUS (SEPSIS)
1000.0000 mL | Freq: Once | INTRAVENOUS | Status: AC
  Administered 2015-04-17: 1000 mL via INTRAVENOUS

## 2015-04-17 MED ORDER — POTASSIUM CHLORIDE CRYS ER 20 MEQ PO TBCR
40.0000 meq | EXTENDED_RELEASE_TABLET | Freq: Once | ORAL | Status: AC
  Administered 2015-04-18: 40 meq via ORAL
  Filled 2015-04-17: qty 2

## 2015-04-17 MED ORDER — POTASSIUM CHLORIDE CRYS ER 20 MEQ PO TBCR: 20.0000 meq | EXTENDED_RELEASE_TABLET | Freq: Two times a day (BID) | ORAL | Status: DC

## 2015-04-17 MED ORDER — CEPHALEXIN 250 MG PO CAPS: 250.0000 mg | ORAL_CAPSULE | Freq: Four times a day (QID) | ORAL | Status: DC

## 2015-04-17 NOTE — ED Provider Notes (Signed)
CSN: [id number]     Arrival date & time 04/17/15  2118 History   First MD Initiated Contact with Patient 04/17/15 2121     Chief Complaint  Patient presents with  . Fall  . Hypotension     (Consider location/radiation/quality/duration/timing/severity/associated sxs/prior Treatment) The history is provided by the patient, the nursing home, medical records and a relative. No language interpreter was used.   81 year old female with past medical history of hypertension, diabetes, recent nausea and vomiting who presents today with syncopal episode that occurred medially prior to arrival. Patient resides in an assisted living facility and states she was in her wheelchair when she was going to the bathroom and subsequently began to feel lightheaded and passed out and fell forward hitting her head on the wall. Patient's unsure of how long she was out for. Patient was notably seen at [facility] long yesterday with nausea and vomiting was given IV fluids and following her workup was discharged home. Patient relates that since then she has had significant decrease in her vomiting but she continues to feel nauseated. Denies any fevers chills chest pain or shortness of breath. She denies any headache or changes in her vision. She denies any hematuria or dysuria recently. No pain or swelling in her legs. She relates that she is not having any pain in her knees currently.  Past Medical History  Diagnosis Date  . Diabetes mellitus   . Thyroid disease   . Complication of anesthesia     trouble intubating surgery  . Difficult intubation   . Arthritis   . UTI (lower urinary tract infection)    Past Surgical History  Procedure Laterality Date  . Abdominal hysterectomy    . Hemorroidectomy    . Medial partial knee replacement    . Thyroidectomy, partial    . Shoulder surgery Right     "had shredded a muscle"  . Achilles tendon surgery Left   . Foot surgery Left     left heel infection that went to bone per  spouse  . Lower abdominel surgery    . Esophagogastroduodenoscopy (egd) with propofol N/A 08/28/2014    Procedure: ESOPHAGOGASTRODUODENOSCOPY (EGD) WITH PROPOFOL;  Surgeon: Louis Meckel, MD;  Location: [facility];  Service: Endoscopy;  Laterality: N/A;  . Colonoscopy N/A 08/29/2014    Procedure: COLONOSCOPY;  Surgeon: Louis Meckel, MD;  Location: [facility];  Service: Endoscopy;  Laterality: N/A;   Family History  Problem Relation Age of Onset  . Family history unknown: Yes   Social History  Substance Use Topics  . Smoking status: Never Smoker   . Smokeless tobacco: Never Used  . Alcohol Use: Yes     Comment: red wine with evening meal   OB History    No data available     Review of Systems  Constitutional: Negative for fever and chills.  HENT: Negative for congestion and rhinorrhea.   Respiratory: Negative for chest tightness and shortness of breath.   Cardiovascular: Negative for chest pain and leg swelling.  Gastrointestinal: Positive for nausea and vomiting. Negative for abdominal pain and diarrhea.  Genitourinary: Positive for decreased urine volume. Negative for dysuria and difficulty urinating.  Neurological: Positive for syncope and light-headedness.  Psychiatric/Behavioral: Negative for confusion and agitation.  All other systems reviewed and are negative.     Allergies  Ciprofloxacin; Lorazepam; Teflaro; Gabapentin; Metoclopramide; Penicillins; and Tape  Home Medications   Prior to Admission medications   Medication Sig Start Date End Date Taking? Authorizing  Provider  Amino Acids-Protein Hydrolys (FEEDING SUPPLEMENT, PRO-STAT SUGAR FREE 64,) LIQD Take 30 mLs by mouth 2 (two) times daily.    Yes Historical Provider, MD  amitriptyline (ELAVIL) 10 MG tablet Take 10 mg by mouth at bedtime.   Yes Historical Provider, MD  Calcium Carb-Cholecalciferol (CALCIUM 500+D) 500-200 MG-UNIT TABS Take 1 tablet by mouth daily.   Yes Historical Provider, MD  capsaicin  (ZOSTRIX) 0.025 % cream Apply 1 application topically at bedtime. Applied to bottoms of feet for pain.   Yes Historical Provider, MD  cephALEXin (KEFLEX) 250 MG capsule Take 1 capsule (250 mg total) by mouth 4 (four) times daily. 04/17/15  Yes Mancel Bale, MD  diclofenac sodium (VOLTAREN) 1 % GEL Apply 2 g topically 4 (four) times daily. Applied to lower back for back pain.   Yes Historical Provider, MD  diltiazem (DILACOR XR) 120 MG 24 hr capsule Take 1 capsule (120 mg total) by mouth daily. 09/20/14  Yes Osvaldo Shipper, MD  diphenhydrAMINE-zinc acetate (BENADRYL) cream Apply 1 application topically at bedtime. Applied to bilateral legs, abdomin, & back prior to bedtime for itching.   Yes Historical Provider, MD  divalproex (DEPAKOTE ER) 250 MG 24 hr tablet Take 250 mg by mouth 2 (two) times daily.    Yes Historical Provider, MD  DULoxetine (CYMBALTA) 20 MG capsule Take 20 mg by mouth every Monday, Wednesday, and Friday.   Yes Historical Provider, MD  febuxostat (ULORIC) 40 MG tablet Take 40 mg by mouth daily.   Yes Historical Provider, MD  furosemide (LASIX) 40 MG tablet Take 40 mg by mouth every Monday, Wednesday, and Friday.   Yes Historical Provider, MD  insulin aspart (NOVOLOG) 100 UNIT/ML injection Inject 1-9 Units into the skin 3 (three) times daily before meals. Sliding scale: 121-150= 1 unit; 151-200= 2 units; 201-250= 3 units; 251-300= 5 units; 301-350= 7 units; 351-400= 9 units   Yes Historical Provider, MD  latanoprost (XALATAN) 0.005 % ophthalmic solution Place 1 drop into both eyes at bedtime.   Yes Historical Provider, MD  levothyroxine (SYNTHROID, LEVOTHROID) 75 MCG tablet Take 75 mcg by mouth daily before breakfast.   Yes Historical Provider, MD  Liniments (SALONPAS PAIN RELIEF PATCH) PADS Apply 1 each topically at bedtime. Applied to lower back at bedtime & remove in the morning   Yes Historical Provider, MD  loperamide (IMODIUM) 2 MG capsule Take 2 mg by mouth every Monday,  Wednesday, and Friday.   Yes Historical Provider, MD  magnesium oxide (MAG-OX) 400 MG tablet Take 400 mg by mouth daily.   Yes Historical Provider, MD  meloxicam (MOBIC) 7.5 MG tablet Take 7.5 mg by mouth every evening.   Yes Historical Provider, MD  Menthol (BIOFREEZE) 10 % AERO Apply 1 spray topically 3 (three) times daily. Applied to right knee for pain.   Yes Historical Provider, MD  metoprolol tartrate (LOPRESSOR) 25 MG tablet Take 0.5 tablets (12.5 mg total) by mouth 2 (two) times daily. 09/20/14  Yes Osvaldo Shipper, MD  Multiple Vitamin (MULTIVITAMIN WITH MINERALS) TABS tablet Take 1 tablet by mouth daily.   Yes Historical Provider, MD  nystatin-triamcinolone (MYCOLOG II) cream Apply 1 application topically 2 (two) times daily. Applied before and after tedhose   Yes Historical Provider, MD  ondansetron (ZOFRAN) 4 MG tablet Take 4 mg by mouth every 8 (eight) hours as needed for nausea or vomiting.   Yes Historical Provider, MD  polyvinyl alcohol (LIQUIFILM TEARS) 1.4 % ophthalmic solution Place 1 drop into both eyes  as directed. 6 times daily as needed for dry eyes.   Yes Historical Provider, MD  Pramox-PE-Glycerin-Petrolatum (PREPARATION H) 1-0.25-14.4-15 % CREA Place 1 application rectally 2 (two) times daily.   Yes Historical Provider, MD  QUEtiapine (SEROQUEL) 50 MG tablet Take 50 mg by mouth 2 (two) times daily.   Yes Historical Provider, MD  traMADol (ULTRAM) 50 MG tablet Take 1 tablet (50 mg total) by mouth every 6 (six) hours as needed. 09/20/14  Yes Osvaldo Shipper, MD  acetaminophen (TYLENOL) 325 MG tablet Take 650 mg by mouth every 4 (four) hours as needed (for pain).    Historical Provider, MD  ENSURE PLUS (ENSURE PLUS) LIQD Take 237 mLs by mouth 2 (two) times daily between meals. Ensure Plus Vanilla    Historical Provider, MD  hydrocortisone (ANUSOL-HC) 25 MG suppository Place 25 mg rectally 2 (two) times daily as needed for hemorrhoids.  09/09/13   Historical Provider, MD  polyethylene  glycol (MIRALAX / GLYCOLAX) packet Take 17 g by mouth daily as needed for moderate constipation. 09/20/14   Osvaldo Shipper, MD  potassium chloride SA (K-DUR,KLOR-CON) 20 MEQ tablet Take 1 tablet (20 mEq total) by mouth 2 (two) times daily. 04/17/15   Mancel Bale, MD  traZODone (DESYREL) 50 MG tablet Take 1 tablet (50 mg total) by mouth at bedtime as needed for sleep. 09/20/14   Osvaldo Shipper, MD   BP 104/42 mmHg  Pulse 85  Temp(Src) 99 F (37.2 C) (Oral)  Resp 18  Ht 5' (1.524 m)  Wt 98 lb 11.2 oz (44.77 kg)  BMI 19.28 kg/m2  SpO2 98% Physical Exam  Constitutional: She is oriented to person, place, and time. No distress.  Frail-appearing  HENT:  Head: Normocephalic and atraumatic.  membranes are dry  Eyes: Conjunctivae and EOM are normal. Pupils are equal, round, and reactive to light.  Neck: Normal range of motion. Neck supple.  Cardiovascular: Normal rate and regular rhythm.   No murmur heard. Pulmonary/Chest: Effort normal and breath sounds normal.  Abdominal: Soft. There is no tenderness.  Musculoskeletal: Normal range of motion. She exhibits no tenderness.  Neurological: She is alert and oriented to person, place, and time. No cranial nerve deficit.  5 out of 5 strength throughout. Normal sensation throughout. Cranial nerves II through XII are intact  Skin: Skin is warm and dry. She is not diaphoretic.  Psychiatric: She has a normal mood and affect. Her behavior is normal.    ED Course  Procedures (including critical care time) Labs Review Labs Reviewed  CBC WITH DIFFERENTIAL/PLATELET - Abnormal; Notable for the following:    RBC 3.32 (*)    Hemoglobin 10.4 (*)    HCT 31.3 (*)    Monocytes Absolute 1.2 (*)    All other components within normal limits  BASIC METABOLIC PANEL - Abnormal; Notable for the following:    Potassium 3.2 (*)    Glucose, Bld 117 (*)    BUN 33 (*)    Creatinine, Ser 1.31 (*)    GFR calc non Af Amer 35 (*)    GFR calc Af Amer 41 (*)    All  other components within normal limits  URINALYSIS, ROUTINE W REFLEX MICROSCOPIC (NOT AT [facility]) - Abnormal; Notable for the following:    Hgb urine dipstick TRACE (*)    Leukocytes, UA MODERATE (*)    All other components within normal limits  URINE MICROSCOPIC-ADD ON - Abnormal; Notable for the following:    Bacteria, UA FEW (*)  All other components within normal limits  VALPROIC ACID LEVEL - Abnormal; Notable for the following:    Valproic Acid Lvl 26 (*)    All other components within normal limits  COMPREHENSIVE METABOLIC PANEL - Abnormal; Notable for the following:    Glucose, Bld 106 (*)    BUN 26 (*)    Creatinine, Ser 1.09 (*)    Albumin 3.1 (*)    ALT 12 (*)    GFR calc non Af Amer 44 (*)    GFR calc Af Amer 51 (*)    All other components within normal limits  CBC WITH DIFFERENTIAL/PLATELET - Abnormal; Notable for the following:    RBC 3.56 (*)    Hemoglobin 11.4 (*)    HCT 34.5 (*)    All other components within normal limits  GLUCOSE, CAPILLARY - Abnormal; Notable for the following:    Glucose-Capillary 60 (*)    All other components within normal limits  GLUCOSE, CAPILLARY - Abnormal; Notable for the following:    Glucose-Capillary 169 (*)    All other components within normal limits  GLUCOSE, CAPILLARY - Abnormal; Notable for the following:    Glucose-Capillary 169 (*)    All other components within normal limits  C DIFFICILE QUICK SCAN W PCR REFLEX  URINE CULTURE  LACTIC ACID, PLASMA  GLUCOSE, CAPILLARY  GLUCOSE, CAPILLARY  GLUCOSE, CAPILLARY  CBC  BASIC METABOLIC PANEL  CBC  I-STAT TROPOININ, ED    Imaging Review No results found.   EKG Interpretation   Date/Time:  Wednesday April 17 2015 21:34:16 EDT Ventricular Rate:  61 PR Interval:  185 QRS Duration: 94 QT Interval:  478 QTC Calculation: 481 R Axis:   53 Text Interpretation:  Sinus rhythm Low voltage, precordial leads  t wave  changes on prior EKG are no longer present Confirmed by  KNAPP  MD-J, JON  ([id number]) on 04/17/2015 10:01:23 PM      MDM   Final diagnoses:  Hypotension, unspecified hypotension type  Hypokalemia  Nausea in adult    81 year old female with past medical history of hypertension, diabetes, recent nausea and vomiting who presents today with syncopal episode that occurred medially prior to arrival.   -Syncope likely 2/2 dehydration. Given 1.5 L saline here. Pt would benefit from admission given trial of home PO hydration over last 24 h has not helped.  -Discussed with medicine and they will plan for admission. -Pt agreeable with plan and stable at time of admission.     Madolyn Frieze, MD 04/19/15 [id number]  Linwood Dibbles, MD 04/21/15 2021

## 2015-04-17 NOTE — Discharge Instructions (Signed)
Drink four 8-10 ounces of water each day in addition to what you usually eat and drink. You will need to double your dose of potassium, for 3 days. Start the antibody prescription in the morning.    Dehydration, Adult Dehydration is when you lose more fluids from the body than you take in. Vital organs like the kidneys, brain, and heart cannot function without a proper amount of fluids and salt. Any loss of fluids from the body can cause dehydration.  CAUSES   Vomiting.  Diarrhea.  Excessive sweating.  Excessive urine output.  Fever. SYMPTOMS  Mild dehydration  Thirst.  Dry lips.  Slightly dry mouth. Moderate dehydration  Very dry mouth.  Sunken eyes.  Skin does not bounce back quickly when lightly pinched and released.  Dark urine and decreased urine production.  Decreased tear production.  Headache. Severe dehydration  Very dry mouth.  Extreme thirst.  Rapid, weak pulse (more than 100 beats per minute at rest).  Cold hands and feet.  Not able to sweat in spite of heat and temperature.  Rapid breathing.  Blue lips.  Confusion and lethargy.  Difficulty being awakened.  Minimal urine production.  No tears. DIAGNOSIS  Your caregiver will diagnose dehydration based on your symptoms and your exam. Blood and urine tests will help confirm the diagnosis. The diagnostic evaluation should also identify the cause of dehydration. TREATMENT  Treatment of mild or moderate dehydration can often be done at home by increasing the amount of fluids that you drink. It is best to drink small amounts of fluid more often. Drinking too much at one time can make vomiting worse. Refer to the home care instructions below. Severe dehydration needs to be treated at the hospital where you will probably be given intravenous (IV) fluids that contain water and electrolytes. HOME CARE INSTRUCTIONS   Ask your caregiver about specific rehydration instructions.  Drink enough  fluids to keep your urine clear or pale yellow.  Drink small amounts frequently if you have nausea and vomiting.  Eat as you normally do.  Avoid:  Foods or drinks high in sugar.  Carbonated drinks.  Juice.  Extremely hot or cold fluids.  Drinks with caffeine.  Fatty, greasy foods.  Alcohol.  Tobacco.  Overeating.  Gelatin desserts.  Wash your hands well to avoid spreading bacteria and viruses.  Only take over-the-counter or prescription medicines for pain, discomfort, or fever as directed by your caregiver.  Ask your caregiver if you should continue all prescribed and over-the-counter medicines.  Keep all follow-up appointments with your caregiver. SEEK MEDICAL CARE IF:  You have abdominal pain and it increases or stays in one area (localizes).  You have a rash, stiff neck, or severe headache.  You are irritable, sleepy, or difficult to awaken.  You are weak, dizzy, or extremely thirsty. SEEK IMMEDIATE MEDICAL CARE IF:   You are unable to keep fluids down or you get worse despite treatment.  You have frequent episodes of vomiting or diarrhea.  You have blood or green matter (bile) in your vomit.  You have blood in your stool or your stool looks black and tarry.  You have not urinated in 6 to 8 hours, or you have only urinated a small amount of very dark urine.  You have a fever.  You faint. MAKE SURE YOU:   Understand these instructions.  Will watch your condition.  Will get help right away if you are not doing well or get worse. Document Released: 08/24/2005 Document  Revised: 11/16/2011 Document Reviewed: 04/13/2011 [facility] Patient Information 2015 [place], [place]. This information is not intended to replace advice given to you by your health care provider. Make sure you discuss any questions you have with your health care provider.  Nausea and Vomiting Nausea means you feel sick to your stomach. Throwing up (vomiting) is a reflex where stomach  contents come out of your mouth. HOME CARE   Take medicine as told by your doctor.  Do not force yourself to eat. However, you do need to drink fluids.  If you feel like eating, eat a normal diet as told by your doctor.  Eat rice, wheat, potatoes, bread, lean meats, yogurt, fruits, and vegetables.  Avoid high-fat foods.  Drink enough fluids to keep your pee (urine) clear or pale yellow.  Ask your doctor how to replace body fluid losses (rehydrate). Signs of body fluid loss (dehydration) include:  Feeling very thirsty.  Dry lips and mouth.  Feeling dizzy.  Dark pee.  Peeing less than normal.  Feeling confused.  Fast breathing or heart rate. GET HELP RIGHT AWAY IF:   You have blood in your throw up.  You have black or bloody poop (stool).  You have a bad headache or stiff neck.  You feel confused.  You have bad belly (abdominal) pain.  You have chest pain or trouble breathing.  You do not pee at least once every 8 hours.  You have cold, clammy skin.  You keep throwing up after 24 to 48 hours.  You have a fever. MAKE SURE YOU:   Understand these instructions.  Will watch your condition.  Will get help right away if you are not doing well or get worse. Document Released: 02/10/2008 Document Revised: 11/16/2011 Document Reviewed: 01/23/2011 [facility] Patient Information 2015 [place], [place]. This information is not intended to replace advice given to you by your health care provider. Make sure you discuss any questions you have with your health care provider.  Hypokalemia Hypokalemia means that the amount of potassium in the blood is lower than normal.Potassium is a chemical, called an electrolyte, that helps regulate the amount of fluid in the body. It also stimulates muscle contraction and helps nerves function properly.Most of the body's potassium is inside of cells, and only a very small amount is in the blood. Because the amount in the blood is so small,  minor changes can be life-threatening. CAUSES  Antibiotics.  Diarrhea or vomiting.  Using laxatives too much, which can cause diarrhea.  Chronic kidney disease.  Water pills (diuretics).  Eating disorders (bulimia).  Low magnesium level.  Sweating a lot. SIGNS AND SYMPTOMS  Weakness.  Constipation.  Fatigue.  Muscle cramps.  Mental confusion.  Skipped heartbeats or irregular heartbeat (palpitations).  Tingling or numbness. DIAGNOSIS  Your health care provider can diagnose hypokalemia with blood tests. In addition to checking your potassium level, your health care provider may also check other lab tests. TREATMENT Hypokalemia can be treated with potassium supplements taken by mouth or adjustments in your current medicines. If your potassium level is very low, you may need to get potassium through a vein (IV) and be monitored in the hospital. A diet high in potassium is also helpful. Foods high in potassium are:  Nuts, such as peanuts and pistachios.  Seeds, such as sunflower seeds and pumpkin seeds.  Peas, lentils, and lima beans.  Whole grain and bran cereals and breads.  Fresh fruit and vegetables, such as apricots, avocado, bananas, cantaloupe, kiwi, oranges, tomatoes,  asparagus, and potatoes.  Orange and tomato juices.  Red meats.  Fruit yogurt. HOME CARE INSTRUCTIONS  Take all medicines as prescribed by your health care provider.  Maintain a healthy diet by including nutritious food, such as fruits, vegetables, nuts, whole grains, and lean meats.  If you are taking a laxative, be sure to follow the directions on the label. SEEK MEDICAL CARE IF:  Your weakness gets worse.  You feel your heart pounding or racing.  You are vomiting or having diarrhea.  You are diabetic and having trouble keeping your blood glucose in the normal range. SEEK IMMEDIATE MEDICAL CARE IF:  You have chest pain, shortness of breath, or dizziness.  You are vomiting or  having diarrhea for more than 2 days.  You faint. MAKE SURE YOU:   Understand these instructions.  Will watch your condition.  Will get help right away if you are not doing well or get worse. Document Released: 08/24/2005 Document Revised: 06/14/2013 Document Reviewed: 02/24/2013 [facility] Patient Information 2015 [place], [place]. This information is not intended to replace advice given to you by your health care provider. Make sure you discuss any questions you have with your health care provider.

## 2015-04-17 NOTE — ED Notes (Signed)
Per EMS:  Pt from [facility].  Staff called 911 when they heard a "thud" and found the patient on her knees pressed against the bathtub.  Pt  Was seen at [facility] yesterday for "pressure issues".  BP on EMS arrival was 60/34.  Pt received NaCl and is no 90/38.  Pt alert and oriented x 4.  EMS sts EKG unremarkable.

## 2015-04-18 ENCOUNTER — Encounter (HOSPITAL_COMMUNITY): Payer: Self-pay | Admitting: *Deleted

## 2015-04-18 DIAGNOSIS — E039 Hypothyroidism, unspecified: Secondary | ICD-10-CM | POA: Diagnosis present

## 2015-04-18 DIAGNOSIS — E119 Type 2 diabetes mellitus without complications: Secondary | ICD-10-CM

## 2015-04-18 DIAGNOSIS — I959 Hypotension, unspecified: Secondary | ICD-10-CM

## 2015-04-18 LAB — CBC WITH DIFFERENTIAL/PLATELET
BASOS ABS: 0.1 10*3/uL (ref 0.0–0.1)
Basophils Relative: 1 % (ref 0–1)
EOS PCT: 4 % (ref 0–5)
Eosinophils Absolute: 0.5 10*3/uL (ref 0.0–0.7)
HCT: 34.5 % — ABNORMAL LOW (ref 36.0–46.0)
Hemoglobin: 11.4 g/dL — ABNORMAL LOW (ref 12.0–15.0)
LYMPHS PCT: 29 % (ref 12–46)
Lymphs Abs: 3 10*3/uL (ref 0.7–4.0)
MCH: 32 pg (ref 26.0–34.0)
MCHC: 33 g/dL (ref 30.0–36.0)
MCV: 96.9 fL (ref 78.0–100.0)
Monocytes Absolute: 1 10*3/uL (ref 0.1–1.0)
Monocytes Relative: 10 % (ref 3–12)
Neutro Abs: 5.8 10*3/uL (ref 1.7–7.7)
Neutrophils Relative %: 56 % (ref 43–77)
Platelets: 210 10*3/uL (ref 150–400)
RBC: 3.56 MIL/uL — ABNORMAL LOW (ref 3.87–5.11)
RDW: 15.4 % (ref 11.5–15.5)
WBC: 10.4 10*3/uL (ref 4.0–10.5)

## 2015-04-18 LAB — LACTIC ACID, PLASMA: LACTIC ACID, VENOUS: 2 mmol/L (ref 0.5–2.0)

## 2015-04-18 LAB — GLUCOSE, CAPILLARY
GLUCOSE-CAPILLARY: 78 mg/dL (ref 65–99)
Glucose-Capillary: 87 mg/dL (ref 65–99)
Glucose-Capillary: 60 mg/dL — ABNORMAL LOW (ref 65–99)
Glucose-Capillary: 169 mg/dL — ABNORMAL HIGH (ref 65–99)
Glucose-Capillary: 169 mg/dL — ABNORMAL HIGH (ref 65–99)
Glucose-Capillary: 84 mg/dL (ref 65–99)

## 2015-04-18 LAB — COMPREHENSIVE METABOLIC PANEL
ALBUMIN: 3.1 g/dL — AB (ref 3.5–5.0)
ALK PHOS: 53 U/L (ref 38–126)
ALT: 12 U/L — ABNORMAL LOW (ref 14–54)
AST: 22 U/L (ref 15–41)
Anion gap: 11 (ref 5–15)
BUN: 26 mg/dL — AB (ref 6–20)
CHLORIDE: 107 mmol/L (ref 101–111)
CO2: 25 mmol/L (ref 22–32)
Calcium: 9.3 mg/dL (ref 8.9–10.3)
Creatinine, Ser: 1.09 mg/dL — ABNORMAL HIGH (ref 0.44–1.00)
GFR calc Af Amer: 51 mL/min — ABNORMAL LOW (ref >60)
GFR calc non Af Amer: 44 mL/min — ABNORMAL LOW (ref >60)
Glucose, Bld: 106 mg/dL — ABNORMAL HIGH (ref 65–99)
Potassium: 4.8 mmol/L (ref 3.5–5.1)
SODIUM: 143 mmol/L (ref 135–145)
Total Bilirubin: 0.5 mg/dL (ref 0.3–1.2)
Total Protein: 6.5 g/dL (ref 6.5–8.1)

## 2015-04-18 LAB — VALPROIC ACID LEVEL: Valproic Acid Lvl: 26 ug/mL — ABNORMAL LOW (ref 50.0–100.0)

## 2015-04-18 LAB — C DIFFICILE QUICK SCREEN W PCR REFLEX
C DIFFICILE (CDIFF) INTERP: NEGATIVE
C Diff antigen: NEGATIVE
C Diff toxin: NEGATIVE

## 2015-04-18 MED ORDER — QUETIAPINE FUMARATE 50 MG PO TABS
50.0000 mg | ORAL_TABLET | Freq: Two times a day (BID) | ORAL | Status: DC
  Administered 2015-04-18 – 2015-04-20 (×5): 50 mg via ORAL
  Filled 2015-04-18 (×6): qty 1

## 2015-04-18 MED ORDER — ENSURE PLUS PO LIQD: 237.0000 mL | Freq: Two times a day (BID) | ORAL | Status: DC

## 2015-04-18 MED ORDER — DIVALPROEX SODIUM ER 250 MG PO TB24
250.0000 mg | ORAL_TABLET | Freq: Two times a day (BID) | ORAL | Status: DC
  Administered 2015-04-18 – 2015-04-20 (×5): 250 mg via ORAL
  Filled 2015-04-18 (×6): qty 1

## 2015-04-18 MED ORDER — DULOXETINE HCL 20 MG PO CPEP
20.0000 mg | ORAL_CAPSULE | ORAL | Status: DC
  Administered 2015-04-19: 20 mg via ORAL
  Filled 2015-04-18: qty 1

## 2015-04-18 MED ORDER — SODIUM CHLORIDE 0.9 % IV SOLN
INTRAVENOUS | Status: DC
  Administered 2015-04-18: 10 mL/h via INTRAVENOUS
  Administered 2015-04-18: 21:00:00 via INTRAVENOUS

## 2015-04-18 MED ORDER — INSULIN ASPART 100 UNIT/ML ~~LOC~~ SOLN
0.0000 [IU] | Freq: Three times a day (TID) | SUBCUTANEOUS | Status: DC
  Administered 2015-04-18 (×2): 2 [IU] via SUBCUTANEOUS

## 2015-04-18 MED ORDER — ONDANSETRON HCL 4 MG/2ML IJ SOLN
4.0000 mg | Freq: Four times a day (QID) | INTRAMUSCULAR | Status: DC | PRN
  Administered 2015-04-18: 4 mg via INTRAVENOUS
  Filled 2015-04-18: qty 2

## 2015-04-18 MED ORDER — HYDROCORTISONE ACETATE 25 MG RE SUPP
25.0000 mg | Freq: Two times a day (BID) | RECTAL | Status: DC | PRN
  Filled 2015-04-18: qty 1

## 2015-04-18 MED ORDER — POLYVINYL ALCOHOL 1.4 % OP SOLN: 1.0000 [drp] | OPHTHALMIC | Status: DC | PRN

## 2015-04-18 MED ORDER — METOPROLOL TARTRATE 12.5 MG HALF TABLET
12.5000 mg | ORAL_TABLET | Freq: Two times a day (BID) | ORAL | Status: DC
  Administered 2015-04-18: 12.5 mg via ORAL
  Filled 2015-04-18 (×2): qty 1

## 2015-04-18 MED ORDER — ENSURE ENLIVE PO LIQD
237.0000 mL | Freq: Two times a day (BID) | ORAL | Status: DC
  Administered 2015-04-18: 237 mL via ORAL

## 2015-04-18 MED ORDER — ACETAMINOPHEN 650 MG RE SUPP: 650.0000 mg | Freq: Four times a day (QID) | RECTAL | Status: DC | PRN

## 2015-04-18 MED ORDER — CEFTRIAXONE SODIUM 1 G IJ SOLR
1.0000 g | INTRAMUSCULAR | Status: DC
  Administered 2015-04-18 – 2015-04-20 (×3): 1 g via INTRAVENOUS
  Filled 2015-04-18 (×4): qty 10

## 2015-04-18 MED ORDER — ENOXAPARIN SODIUM 30 MG/0.3ML ~~LOC~~ SOLN
30.0000 mg | SUBCUTANEOUS | Status: DC
  Administered 2015-04-18 – 2015-04-20 (×3): 30 mg via SUBCUTANEOUS
  Filled 2015-04-18 (×3): qty 0.3

## 2015-04-18 MED ORDER — ENOXAPARIN SODIUM 40 MG/0.4ML ~~LOC~~ SOLN: 40.0000 mg | SUBCUTANEOUS | Status: DC

## 2015-04-18 MED ORDER — TRAZODONE HCL 50 MG PO TABS
50.0000 mg | ORAL_TABLET | Freq: Every evening | ORAL | Status: DC | PRN
  Filled 2015-04-18: qty 1

## 2015-04-18 MED ORDER — ONDANSETRON HCL 4 MG PO TABS: 4.0000 mg | ORAL_TABLET | Freq: Four times a day (QID) | ORAL | Status: DC | PRN

## 2015-04-18 MED ORDER — LATANOPROST 0.005 % OP SOLN
1.0000 [drp] | Freq: Every day | OPHTHALMIC | Status: DC
  Administered 2015-04-18 – 2015-04-19 (×2): 1 [drp] via OPHTHALMIC
  Filled 2015-04-18: qty 2.5

## 2015-04-18 MED ORDER — CALCIUM CARB-CHOLECALCIFEROL 500-200 MG-UNIT PO TABS: 1.0000 | ORAL_TABLET | Freq: Every day | ORAL | Status: DC

## 2015-04-18 MED ORDER — ACETAMINOPHEN 325 MG PO TABS
650.0000 mg | ORAL_TABLET | Freq: Four times a day (QID) | ORAL | Status: DC | PRN
  Administered 2015-04-20: 650 mg via ORAL

## 2015-04-18 MED ORDER — LEVOTHYROXINE SODIUM 75 MCG PO TABS
75.0000 ug | ORAL_TABLET | Freq: Every day | ORAL | Status: DC
  Administered 2015-04-18 – 2015-04-20 (×3): 75 ug via ORAL
  Filled 2015-04-18 (×4): qty 1

## 2015-04-18 MED ORDER — MELOXICAM 7.5 MG PO TABS
7.5000 mg | ORAL_TABLET | Freq: Every evening | ORAL | Status: DC
  Administered 2015-04-18: 7.5 mg via ORAL
  Filled 2015-04-18: qty 1

## 2015-04-18 MED ORDER — CALCIUM CARBONATE-VITAMIN D 500-200 MG-UNIT PO TABS
1.0000 | ORAL_TABLET | Freq: Every day | ORAL | Status: DC
  Administered 2015-04-18 – 2015-04-20 (×3): 1 via ORAL
  Filled 2015-04-18 (×4): qty 1

## 2015-04-18 MED ORDER — TRAMADOL HCL 50 MG PO TABS
50.0000 mg | ORAL_TABLET | Freq: Four times a day (QID) | ORAL | Status: DC | PRN
  Administered 2015-04-19 – 2015-04-20 (×2): 50 mg via ORAL
  Filled 2015-04-18 (×2): qty 1

## 2015-04-18 MED ORDER — MAGNESIUM OXIDE 400 (241.3 MG) MG PO TABS
400.0000 mg | ORAL_TABLET | Freq: Every day | ORAL | Status: DC
  Administered 2015-04-18 – 2015-04-20 (×3): 400 mg via ORAL
  Filled 2015-04-18 (×3): qty 1

## 2015-04-18 MED ORDER — DILTIAZEM HCL ER 120 MG PO CP24
120.0000 mg | ORAL_CAPSULE | Freq: Every day | ORAL | Status: DC
Start: 2015-04-18 — End: 2015-04-20
  Administered 2015-04-18 – 2015-04-20 (×3): 120 mg via ORAL
  Filled 2015-04-18 (×3): qty 1

## 2015-04-18 MED ORDER — AMITRIPTYLINE HCL 10 MG PO TABS
10.0000 mg | ORAL_TABLET | Freq: Every day | ORAL | Status: DC
  Administered 2015-04-18 – 2015-04-19 (×2): 10 mg via ORAL
  Filled 2015-04-18 (×3): qty 1

## 2015-04-18 MED ORDER — FEBUXOSTAT 40 MG PO TABS
40.0000 mg | ORAL_TABLET | Freq: Every day | ORAL | Status: DC
  Administered 2015-04-18 – 2015-04-20 (×3): 40 mg via ORAL
  Filled 2015-04-18 (×3): qty 1

## 2015-04-18 NOTE — Progress Notes (Signed)
Pt seen and examined, admitted this am..this is an 89/F with DM, HTN, Gout admitted with weakness, transient hypotension and possible UTI' -hydrate, ROcephin, Urine CX -PT eval  Zannie Cove, MD [phone]

## 2015-04-18 NOTE — H&P (Signed)
[facility] History and Physical  Kaitlyn Burns ZOX:[id number] DOB: 11-28-1924 DOA: 04/17/2015  Referring physician: ER physician. PCP: No primary care provider on file.  Specialists: None.  Chief Complaint: Low blood pressure.  HPI: Kaitlyn Burns is a 81 y.o. female with history of diabetes mellitus type 2, hypothyroidism, hypertension, gout was brought to the ER after patient was following the floor. When EMS reached patient's assisted living facility patient blood pressure was found to be in the 80s systolic. Patient had come to the ER one day previously for nausea vomiting and at that time patient was given IV fluids and discharged back to assisted living facility. Patient is largely wheelchair-bound and patient states she was trying to go to the bathroom when she suddenly tripped and fell. Denies hitting her head or losing consciousness. On exam patient is nonfocal. Patient states her nausea vomiting has resolved. Patient was given 1 L normal saline bolus following which the pressure has improved. Patient is admitted for observation. UA shows possible UTI.   Review of Systems: As presented in the history of presenting illness, rest negative.  Past Medical History  Diagnosis Date  . Diabetes mellitus   . Thyroid disease   . Complication of anesthesia     trouble intubating surgery  . Difficult intubation   . Arthritis   . UTI (lower urinary tract infection)    Past Surgical History  Procedure Laterality Date  . Abdominal hysterectomy    . Hemorroidectomy    . Medial partial knee replacement    . Thyroidectomy, partial    . Shoulder surgery Right     "had shredded a muscle"  . Achilles tendon surgery Left   . Foot surgery Left     left heel infection that went to bone per spouse  . Lower abdominel surgery    . Esophagogastroduodenoscopy (egd) with propofol N/A 08/28/2014    Procedure: ESOPHAGOGASTRODUODENOSCOPY (EGD) WITH PROPOFOL;  Surgeon: Louis Meckel, MD;  Location:  [facility];  Service: Endoscopy;  Laterality: N/A;  . Colonoscopy N/A 08/29/2014    Procedure: COLONOSCOPY;  Surgeon: Louis Meckel, MD;  Location: [facility] ENDOSCOPY;  Service: Endoscopy;  Laterality: N/A;   Social History:  reports that she has never smoked. She has never used smokeless tobacco. She reports that she drinks alcohol. She reports that she does not use illicit drugs. Where does patient live in assisted living. Can patient participate in ADLs? Yes.  Allergies  Allergen Reactions  . Ciprofloxacin Other (See Comments)    Unknown reaction (MAR)  . Lorazepam Other (See Comments)    confusion  . Teflaro [Ceftaroline] Other (See Comments)    Confusion/delirium  . Gabapentin Rash  . Metoclopramide Anxiety and Other (See Comments)    Irritability   . Penicillins Rash  . Tape Rash    Family History:  Family History  Problem Relation Age of Onset  . Family history unknown: Yes      Prior to Admission medications   Medication Sig Start Date End Date Taking? Authorizing Provider  Amino Acids-Protein Hydrolys (FEEDING SUPPLEMENT, PRO-STAT SUGAR FREE 64,) LIQD Take 30 mLs by mouth 2 (two) times daily.    Yes Historical Provider, MD  amitriptyline (ELAVIL) 10 MG tablet Take 10 mg by mouth at bedtime.   Yes Historical Provider, MD  Calcium Carb-Cholecalciferol (CALCIUM 500+D) 500-200 MG-UNIT TABS Take 1 tablet by mouth daily.   Yes Historical Provider, MD  capsaicin (ZOSTRIX) 0.025 % cream Apply 1 application topically at bedtime. Applied to  bottoms of feet for pain.   Yes Historical Provider, MD  cephALEXin (KEFLEX) 250 MG capsule Take 1 capsule (250 mg total) by mouth 4 (four) times daily. 04/17/15  Yes Mancel Bale, MD  diclofenac sodium (VOLTAREN) 1 % GEL Apply 2 g topically 4 (four) times daily. Applied to lower back for back pain.   Yes Historical Provider, MD  diltiazem (DILACOR XR) 120 MG 24 hr capsule Take 1 capsule (120 mg total) by mouth daily. 09/20/14  Yes Osvaldo Shipper,  MD  diphenhydrAMINE-zinc acetate (BENADRYL) cream Apply 1 application topically at bedtime. Applied to bilateral legs, abdomin, & back prior to bedtime for itching.   Yes Historical Provider, MD  divalproex (DEPAKOTE ER) 250 MG 24 hr tablet Take 250 mg by mouth 2 (two) times daily.    Yes Historical Provider, MD  DULoxetine (CYMBALTA) 20 MG capsule Take 20 mg by mouth every Monday, Wednesday, and Friday.   Yes Historical Provider, MD  febuxostat (ULORIC) 40 MG tablet Take 40 mg by mouth daily.   Yes Historical Provider, MD  furosemide (LASIX) 40 MG tablet Take 40 mg by mouth every Monday, Wednesday, and Friday.   Yes Historical Provider, MD  insulin aspart (NOVOLOG) 100 UNIT/ML injection Inject 1-9 Units into the skin 3 (three) times daily before meals. Sliding scale: 121-150= 1 unit; 151-200= 2 units; 201-250= 3 units; 251-300= 5 units; 301-350= 7 units; 351-400= 9 units   Yes Historical Provider, MD  latanoprost (XALATAN) 0.005 % ophthalmic solution Place 1 drop into both eyes at bedtime.   Yes Historical Provider, MD  levothyroxine (SYNTHROID, LEVOTHROID) 75 MCG tablet Take 75 mcg by mouth daily before breakfast.   Yes Historical Provider, MD  Liniments (SALONPAS PAIN RELIEF PATCH) PADS Apply 1 each topically at bedtime. Applied to lower back at bedtime & remove in the morning   Yes Historical Provider, MD  loperamide (IMODIUM) 2 MG capsule Take 2 mg by mouth every Monday, Wednesday, and Friday.   Yes Historical Provider, MD  magnesium oxide (MAG-OX) 400 MG tablet Take 400 mg by mouth daily.   Yes Historical Provider, MD  meloxicam (MOBIC) 7.5 MG tablet Take 7.5 mg by mouth every evening.   Yes Historical Provider, MD  Menthol (BIOFREEZE) 10 % AERO Apply 1 spray topically 3 (three) times daily. Applied to right knee for pain.   Yes Historical Provider, MD  metoprolol tartrate (LOPRESSOR) 25 MG tablet Take 0.5 tablets (12.5 mg total) by mouth 2 (two) times daily. 09/20/14  Yes Osvaldo Shipper, MD   Multiple Vitamin (MULTIVITAMIN WITH MINERALS) TABS tablet Take 1 tablet by mouth daily.   Yes Historical Provider, MD  nystatin-triamcinolone (MYCOLOG II) cream Apply 1 application topically 2 (two) times daily. Applied before and after tedhose   Yes Historical Provider, MD  ondansetron (ZOFRAN) 4 MG tablet Take 4 mg by mouth every 8 (eight) hours as needed for nausea or vomiting.   Yes Historical Provider, MD  polyvinyl alcohol (LIQUIFILM TEARS) 1.4 % ophthalmic solution Place 1 drop into both eyes as directed. 6 times daily as needed for dry eyes.   Yes Historical Provider, MD  Pramox-PE-Glycerin-Petrolatum (PREPARATION H) 1-0.25-14.4-15 % CREA Place 1 application rectally 2 (two) times daily.   Yes Historical Provider, MD  QUEtiapine (SEROQUEL) 50 MG tablet Take 50 mg by mouth 2 (two) times daily.   Yes Historical Provider, MD  traMADol (ULTRAM) 50 MG tablet Take 1 tablet (50 mg total) by mouth every 6 (six) hours as needed. 09/20/14  Yes  Osvaldo Shipper, MD  acetaminophen (TYLENOL) 325 MG tablet Take 650 mg by mouth every 4 (four) hours as needed (for pain).    Historical Provider, MD  ENSURE PLUS (ENSURE PLUS) LIQD Take 237 mLs by mouth 2 (two) times daily between meals. Ensure Plus Vanilla    Historical Provider, MD  hydrocortisone (ANUSOL-HC) 25 MG suppository Place 25 mg rectally 2 (two) times daily as needed for hemorrhoids.  09/09/13   Historical Provider, MD  polyethylene glycol (MIRALAX / GLYCOLAX) packet Take 17 g by mouth daily as needed for moderate constipation. 09/20/14   Osvaldo Shipper, MD  potassium chloride SA (K-DUR,KLOR-CON) 20 MEQ tablet Take 1 tablet (20 mEq total) by mouth 2 (two) times daily. 04/17/15   Mancel Bale, MD  traZODone (DESYREL) 50 MG tablet Take 1 tablet (50 mg total) by mouth at bedtime as needed for sleep. 09/20/14   Osvaldo Shipper, MD    Physical Exam: Filed Vitals:   04/18/15 0200 04/18/15 0215 04/18/15 0306 04/18/15 0320  BP: 115/58 102/51 139/64   Pulse: 65  63 73   Temp:   97.6 F (36.4 C)   TempSrc:   Oral   Resp: 11 12 18    Height:    5' (1.524 m)  Weight:   44.77 kg (98 lb 11.2 oz)   SpO2: 98% 96% 100%      General:  Moderately built and nourished.  Eyes: Anicteric no pallor.  ENT: No discharge from the ears eyes nose and mouth.  Neck: No neck rigidity.  Cardiovascular: S1-S2 heard.  Respiratory: No rhonchi or crepitations.  Abdomen: Soft nontender bowel sounds present.  Skin: No rash.  Musculoskeletal: No edema.  Psychiatric: Appears normal.  Neurologic: Alert awake oriented to time place and person. Moves all extremities.  Labs on Admission:  Basic Metabolic Panel:  Recent Labs Lab 04/16/15 2151 04/17/15 2203  NA 140 137  K 3.1* 3.2*  CL 102 102  CO2 26 25  GLUCOSE 142* 117*  BUN 37* 33*  CREATININE 1.31* 1.31*  CALCIUM 10.6* 9.2   Liver Function Tests: No results for input(s): AST, ALT, ALKPHOS, BILITOT, PROT, ALBUMIN in the last 168 hours. No results for input(s): LIPASE, AMYLASE in the last 168 hours. No results for input(s): AMMONIA in the last 168 hours. CBC:  Recent Labs Lab 04/16/15 2151 04/17/15 2203  WBC 13.9* 10.0  NEUTROABS 10.9* 6.2  HGB 12.5 10.4*  HCT 38.1 31.3*  MCV 95.5 94.3  PLT 250 197   Cardiac Enzymes:  Recent Labs Lab 04/16/15 2151  TROPONINI <0.03    BNP (last 3 results) No results for input(s): BNP in the last 8760 hours.  ProBNP (last 3 results) No results for input(s): PROBNP in the last 8760 hours.  CBG:  Recent Labs Lab 04/18/15 0316  GLUCAP 87    Radiological Exams on Admission: Dg Chest Port 1 View  04/16/2015   CLINICAL DATA:  Acute onset of nausea and right upper chest and rib pain. Generalized weakness. Initial encounter.  EXAM: PORTABLE CHEST - 1 VIEW  COMPARISON:  Chest radiograph from 08/31/2014  FINDINGS: The lungs are well-aerated and clear. There is no evidence of focal opacification, pleural effusion or pneumothorax.  The  cardiomediastinal silhouette is within normal limits. No acute osseous abnormalities are seen.  IMPRESSION: No acute cardiopulmonary process seen. No displaced rib fractures identified.   Electronically Signed   By: Roanna Raider M.D.   On: 04/16/2015 22:28    EKG: Independently reviewed. Normal sinus  rhythm with low voltage.  Assessment/Plan Principal Problem:   Hypotension Active Problems:   Hypertension   Hypothyroidism   Diabetes mellitus type 2, controlled   1. Hypotension probably from dehydration from recent vomiting - patient has received 1 L normal saline following which blood pressure has improved. I'm holding her outpatient Lasix and closely monitor. Physical therapy consult. 2. Diabetes mellitus type 2 on sliding scale coverage. 3. Hypothyroidism on Synthroid. 4. Hypertension holding of Lasix due to #1. 5. Gout on ULORIC. 6. Reason nausea vomiting which has subsided. 7. Possible UTI for which patient has been placed on ceftriaxone check urine culture.  I have reviewed patient's old charts labs and personally reviewed x-ray and EKG.   DVT Prophylaxis Lovenox.  Code Status: DO NOT RESUSCITATE.  Family Communication: Discussed with patient.  Disposition Plan: Admit for observation.    Aika Brzoska N. [facility] Pager [phone].  If 7PM-7AM, please contact night-coverage [email or web address] Password [facility] 04/18/2015, 5:52 AM

## 2015-04-18 NOTE — Clinical Social Work Note (Addendum)
   CSW spoke with [facility] to discuss PT recommendation for SNF- [facility] states that they are able to accept pt with current BP/dizziness issues.  CSW faxed clinicals to the facility for review.  CSW will continue to follow.  Merlyn Lot, LCSWA Clinical Social Worker [phone]

## 2015-04-18 NOTE — Clinical Social Work Note (Signed)
Clinical Social Work Assessment  Patient Details  Name: Kaitlyn Burns MRN: [id number] Date of Birth: Aug 30, 1925  Date of referral:  04/18/15               Reason for consult:  Facility Placement                Permission sought to share information with:  Oceanographer granted to share information::  Yes, Verbal Permission Granted  Name::        Agency::  [facility]  Relationship::     Contact Information:     Housing/Transportation Living arrangements for the past 2 months:  Assisted Dealer of Information:  Patient Patient Interpreter Needed:  None Criminal Activity/Legal Involvement Pertinent to Current Situation/Hospitalization:  No - Comment as needed Significant Relationships:  Spouse Lives with:  Spouse, Facility Resident Do you feel safe going back to the place where you live?  Yes Need for family participation in patient care:  No (Coment)  Care giving concerns:  None- pt is an ALF resident at [facility]- lives with her husband at the a facility   Office manager / plan:  CSW spoke with pt about return to ALF- pt agreeable  Employment status:  Retired Health and safety inspector:  Medicare PT Recommendations:  Not assessed at this time Information / Referral to community resources:  Skilled Nursing Facility  Patient/Family's Response to care: pt agreeable to returning to ALF  Patient/Family's Understanding of and Emotional Response to Diagnosis, Current Treatment, and Prognosis:  Pt has no questions or concerns about treatment/prognosis  Emotional Assessment Appearance:  Appears stated age Attitude/Demeanor/Rapport:    Affect (typically observed):  Appropriate, Pleasant Orientation:  Oriented to  Time, Oriented to Place, Oriented to Self, Oriented to Situation Alcohol / Substance use:  Not Applicable Psych involvement (Current and /or in the community):     Discharge Needs  Concerns to be addressed:  Care  Coordination Readmission within the last 30 days:  No Current discharge risk:  None Barriers to Discharge:  Continued Medical Work up   [employer], LCSW 04/18/2015, 1:29 PM

## 2015-04-18 NOTE — Evaluation (Signed)
Physical Therapy Evaluation Patient Details Name: Kaitlyn Burns MRN: [id number] DOB: [birth year] Today's Date: 04/18/2015   History of Present Illness  s/p N/V for several days; seen in ED 8/9 with dehydration and IV fluids given; returned to ALFand again presented to ED 8/10 with fall and same symptoms PMHx-diabetes mellitus type 2, hypothyroidism, hypertension, gout   Clinical Impression  Pt admitted with above diagnosis. Pt currently primarily limited by symptomatic orthostasis. Also with functional limitations due to the deficits listed below (see PT Problem List).  Pt will benefit from skilled PT to increase their independence and safety with mobility to allow discharge to the venue listed below.       Follow Up Recommendations SNF;Supervision for mobility/OOB (If BP stabilizes, should be able to return to ALF)    Equipment Recommendations  None recommended by PT    Recommendations for Other Services OT consult     Precautions / Restrictions Precautions Precautions: Fall      Mobility  Bed Mobility Overal bed mobility: Needs Assistance Bed Mobility: Rolling;Sidelying to Sit;Sit to Supine Rolling: Modified independent (Device/Increase time) Sidelying to sit: Min guard;HOB elevated   Sit to supine: Min guard   General bed mobility comments: + use of rail; states she sleeps on several pillows at home  Transfers                 General transfer comment: unable to attempt due to dizziness, nausea, orthostasis  Ambulation/Gait                Stairs            Wheelchair Mobility    Modified Rankin (Stroke Patients Only)       Balance Overall balance assessment: Needs assistance Sitting-balance support: No upper extremity supported;Feet supported Sitting balance-Leahy Scale: Fair Sitting balance - Comments: EOB                                     Pertinent Vitals/Pain See vitals flow sheet.   Pain Assessment: Faces Faces Pain  Scale: Hurts little more Pain Location: throat Pain Descriptors / Indicators: Discomfort (with swallowing) Pain Intervention(s): Monitored during session    Home Living Family/patient expects to be discharged to:: Assisted living Living Arrangements: Spouse/significant other Available Help at Discharge: Other (Comment);Available PRN/intermittently (staff ALF) Type of Home: Assisted living Home Access: Level entry     Home Layout: One level Home Equipment: Wheelchair - manual;Grab bars - toilet;Grab bars - tub/shower;Emergency planning/management officer - 2 wheels (shares RW with her husband (but she rarely uses))      Prior Function Level of Independence: Needs assistance   Gait / Transfers Assistance Needed: pt was non ambulatory - uses a w/c, but was able to transfer bed <> w/c   ADL's / Homemaking Assistance Needed: staff assists her when she gets in the shower; she can do "bird bath" at the sink herself        Hand Dominance        Extremity/Trunk Assessment   Upper Extremity Assessment: Overall WFL for tasks assessed           Lower Extremity Assessment: Generalized weakness      Cervical / Trunk Assessment: Kyphotic  Communication   Communication: HOH  Cognition Arousal/Alertness: Awake/alert Behavior During Therapy: WFL for tasks assessed/performed Overall Cognitive Status: Within Functional Limits for tasks assessed  General Comments      Exercises        Assessment/Plan    PT Assessment Patient needs continued PT services  PT Diagnosis Generalized weakness   PT Problem List Decreased strength;Decreased activity tolerance;Decreased balance;Decreased mobility;Decreased knowledge of use of DME;Cardiopulmonary status limiting activity  PT Treatment Interventions DME instruction;Functional mobility training;Therapeutic activities;Therapeutic exercise;Balance training;Patient/family education;Wheelchair mobility training   PT Goals  (Current goals can be found in the Care Plan section) Acute Rehab PT Goals Patient Stated Goal: return to ALF to celebrate her 71th wedding anniversary with husband PT Goal Formulation: With patient Time For Goal Achievement: 04/23/15 Potential to Achieve Goals: Good    Frequency Min 3X/week   Barriers to discharge Decreased caregiver support currently ALF level of care    Co-evaluation               End of Session   Activity Tolerance: Treatment limited secondary to medical complications (Comment) (nausea, dizzy, orthostasis) Patient left: in bed;with call bell/phone within reach;with bed alarm set;Other (comment) (on bedpan) Nurse Communication: Mobility status;Precautions (orthostatic)    Functional Assessment Tool Used: clinical judgement Functional Limitation: Changing and maintaining body position Changing and Maintaining Body Position Current Status (Z6109): At least 80 percent but less than 100 percent impaired, limited or restricted Changing and Maintaining Body Position Goal Status (U0454): At least 1 percent but less than 20 percent impaired, limited or restricted    Time: 1312-1339 PT Time Calculation (min) (ACUTE ONLY): 27 min   Charges:   PT Evaluation $Initial PT Evaluation Tier I: 1 Procedure PT Treatments $Therapeutic Activity: 8-22 mins   PT G Codes:   PT G-Codes **NOT FOR INPATIENT CLASS** Functional Assessment Tool Used: clinical judgement Functional Limitation: Changing and maintaining body position Changing and Maintaining Body Position Current Status (U9811): At least 80 percent but less than 100 percent impaired, limited or restricted Changing and Maintaining Body Position Goal Status (B1478): At least 1 percent but less than 20 percent impaired, limited or restricted    Asier Desroches 04/18/2015, 1:57 PM  Pager [phone]

## 2015-04-18 NOTE — Progress Notes (Signed)
Pt saved telemetry reviewed and Dr. Jomarie Longs called and notified of pt saved strip from Telemetry. MD ordered EKG and to hold HS Lopressor dose. Pt asymptomatic in room eating diner. Will continue to closely monitor; call light within reach. Arabella Merles Santez Woodcox RN.

## 2015-04-18 NOTE — Progress Notes (Signed)
Pt had x3 loose BM; MD notified and C-diff order received. Pt placed on contact per protocol. Will closely monitor. Arabella Merles Jep Dyas RN.

## 2015-04-18 NOTE — Progress Notes (Signed)
ANTIBIOTIC CONSULT NOTE - INITIAL  Pharmacy Consult for Ceftriaxone Indication: UTI  Allergies  Allergen Reactions  . Ciprofloxacin Other (See Comments)    Unknown reaction (MAR)  . Lorazepam Other (See Comments)    confusion  . Teflaro [Ceftaroline] Other (See Comments)    Confusion/delirium  . Gabapentin Rash  . Metoclopramide Anxiety and Other (See Comments)    Irritability   . Penicillins Rash  . Tape Rash    Patient Measurements: Height: 5' (152.4 cm) Weight: 98 lb 11.2 oz (44.77 kg) IBW/kg (Calculated) : 45.5  Vital Signs: Temp: 97.6 F (36.4 C) (08/11 0306) Temp Source: Oral (08/11 0306) BP: 139/64 mmHg (08/11 0306) Pulse Rate: 73 (08/11 0306) Intake/Output from previous day:   Intake/Output from this shift:    Labs:  Recent Labs  04/16/15 2151 04/17/15 2203  WBC 13.9* 10.0  HGB 12.5 10.4*  PLT 250 197  CREATININE 1.31* 1.31*   Estimated Creatinine Clearance: 21 mL/min (by C-G formula based on Cr of 1.31). No results for input(s): VANCOTROUGH, VANCOPEAK, VANCORANDOM, GENTTROUGH, GENTPEAK, GENTRANDOM, TOBRATROUGH, TOBRAPEAK, TOBRARND, AMIKACINPEAK, AMIKACINTROU, AMIKACIN in the last 72 hours.   Microbiology: No results found for this or any previous visit (from the past 720 hour(s)).  Medical History: Past Medical History  Diagnosis Date  . Diabetes mellitus   . Thyroid disease   . Complication of anesthesia     trouble intubating surgery  . Difficult intubation   . Arthritis   . UTI (lower urinary tract infection)     Medications:  Prescriptions prior to admission  Medication Sig Dispense Refill Last Dose  . Amino Acids-Protein Hydrolys (FEEDING SUPPLEMENT, PRO-STAT SUGAR FREE 64,) LIQD Take 30 mLs by mouth 2 (two) times daily.    04/17/2015 at Unknown time  . amitriptyline (ELAVIL) 10 MG tablet Take 10 mg by mouth at bedtime.   04/17/2015 at Unknown time  . Calcium Carb-Cholecalciferol (CALCIUM 500+D) 500-200 MG-UNIT TABS Take 1 tablet by  mouth daily.   04/17/2015 at Unknown time  . capsaicin (ZOSTRIX) 0.025 % cream Apply 1 application topically at bedtime. Applied to bottoms of feet for pain.   04/17/2015 at Unknown time  . cephALEXin (KEFLEX) 250 MG capsule Take 1 capsule (250 mg total) by mouth 4 (four) times daily. 28 capsule 0 04/17/2015 at Unknown time  . diclofenac sodium (VOLTAREN) 1 % GEL Apply 2 g topically 4 (four) times daily. Applied to lower back for back pain.   04/17/2015 at Unknown time  . diltiazem (DILACOR XR) 120 MG 24 hr capsule Take 1 capsule (120 mg total) by mouth daily.   04/17/2015 at Unknown time  . diphenhydrAMINE-zinc acetate (BENADRYL) cream Apply 1 application topically at bedtime. Applied to bilateral legs, abdomin, & back prior to bedtime for itching.   04/17/2015 at Unknown time  . divalproex (DEPAKOTE ER) 250 MG 24 hr tablet Take 250 mg by mouth 2 (two) times daily.    04/17/2015 at Unknown time  . DULoxetine (CYMBALTA) 20 MG capsule Take 20 mg by mouth every Monday, Wednesday, and Friday.   04/17/2015 at Unknown time  . febuxostat (ULORIC) 40 MG tablet Take 40 mg by mouth daily.   04/17/2015 at Unknown time  . furosemide (LASIX) 40 MG tablet Take 40 mg by mouth every Monday, Wednesday, and Friday.   04/17/2015 at Unknown time  . insulin aspart (NOVOLOG) 100 UNIT/ML injection Inject 1-9 Units into the skin 3 (three) times daily before meals. Sliding scale: 121-150= 1 unit; 151-200= 2 units;  201-250= 3 units; 251-300= 5 units; 301-350= 7 units; 351-400= 9 units   04/17/2015 at Unknown time  . latanoprost (XALATAN) 0.005 % ophthalmic solution Place 1 drop into both eyes at bedtime.   04/17/2015 at Unknown time  . levothyroxine (SYNTHROID, LEVOTHROID) 75 MCG tablet Take 75 mcg by mouth daily before breakfast.   04/17/2015 at Unknown time  . Liniments (SALONPAS PAIN RELIEF PATCH) PADS Apply 1 each topically at bedtime. Applied to lower back at bedtime & remove in the morning   04/17/2015 at Unknown time  . loperamide  (IMODIUM) 2 MG capsule Take 2 mg by mouth every Monday, Wednesday, and Friday.   04/17/2015 at Unknown time  . magnesium oxide (MAG-OX) 400 MG tablet Take 400 mg by mouth daily.   04/17/2015 at Unknown time  . meloxicam (MOBIC) 7.5 MG tablet Take 7.5 mg by mouth every evening.   04/17/2015 at Unknown time  . Menthol (BIOFREEZE) 10 % AERO Apply 1 spray topically 3 (three) times daily. Applied to right knee for pain.   04/17/2015 at Unknown time  . metoprolol tartrate (LOPRESSOR) 25 MG tablet Take 0.5 tablets (12.5 mg total) by mouth 2 (two) times daily.   04/17/2015 at 2100  . Multiple Vitamin (MULTIVITAMIN WITH MINERALS) TABS tablet Take 1 tablet by mouth daily.   04/17/2015 at Unknown time  . nystatin-triamcinolone (MYCOLOG II) cream Apply 1 application topically 2 (two) times daily. Applied before and after tedhose   04/17/2015 at Unknown time  . ondansetron (ZOFRAN) 4 MG tablet Take 4 mg by mouth every 8 (eight) hours as needed for nausea or vomiting.   04/17/2015 at Unknown time  . polyvinyl alcohol (LIQUIFILM TEARS) 1.4 % ophthalmic solution Place 1 drop into both eyes as directed. 6 times daily as needed for dry eyes.   04/17/2015 at Unknown time  . Pramox-PE-Glycerin-Petrolatum (PREPARATION H) 1-0.25-14.4-15 % CREA Place 1 application rectally 2 (two) times daily.   04/17/2015 at Unknown time  . QUEtiapine (SEROQUEL) 50 MG tablet Take 50 mg by mouth 2 (two) times daily.   04/17/2015 at Unknown time  . traMADol (ULTRAM) 50 MG tablet Take 1 tablet (50 mg total) by mouth every 6 (six) hours as needed. 30 tablet 0 04/17/2015 at Unknown time  . acetaminophen (TYLENOL) 325 MG tablet Take 650 mg by mouth every 4 (four) hours as needed (for pain).   unknown  . ENSURE PLUS (ENSURE PLUS) LIQD Take 237 mLs by mouth 2 (two) times daily between meals. Ensure Plus Vanilla   04/16/2015 at Unknown time  . hydrocortisone (ANUSOL-HC) 25 MG suppository Place 25 mg rectally 2 (two) times daily as needed for hemorrhoids.     unknown  . polyethylene glycol (MIRALAX / GLYCOLAX) packet Take 17 g by mouth daily as needed for moderate constipation. 14 each 0 unknown  . potassium chloride SA (K-DUR,KLOR-CON) 20 MEQ tablet Take 1 tablet (20 mEq total) by mouth 2 (two) times daily. 6 tablet 0 has not started  . traZODone (DESYREL) 50 MG tablet Take 1 tablet (50 mg total) by mouth at bedtime as needed for sleep. 30 tablet 0 unknown   Assessment: 81 y.o. female presents with hypotension. UA shows possible UTI. To begin Rocephin for UTI. MD aware of PCN allergy (rash) and Ceftaroline (confusion).  Goal of Therapy:  Resolution of infection  Plan:  Rocephin 1gm IV q24h Pharmacy will sign off - please reconsult if needed.  Christoper Fabian, PharmD, BCPS Clinical pharmacist, pager [phone] 04/18/2015,6:40 AM

## 2015-04-19 DIAGNOSIS — E860 Dehydration: Secondary | ICD-10-CM | POA: Diagnosis present

## 2015-04-19 DIAGNOSIS — E119 Type 2 diabetes mellitus without complications: Secondary | ICD-10-CM | POA: Diagnosis present

## 2015-04-19 DIAGNOSIS — Z9109 Other allergy status, other than to drugs and biological substances: Secondary | ICD-10-CM | POA: Diagnosis not present

## 2015-04-19 DIAGNOSIS — Z881 Allergy status to other antibiotic agents status: Secondary | ICD-10-CM | POA: Diagnosis not present

## 2015-04-19 DIAGNOSIS — E861 Hypovolemia: Secondary | ICD-10-CM | POA: Diagnosis present

## 2015-04-19 DIAGNOSIS — N390 Urinary tract infection, site not specified: Secondary | ICD-10-CM | POA: Diagnosis present

## 2015-04-19 DIAGNOSIS — E876 Hypokalemia: Secondary | ICD-10-CM | POA: Diagnosis present

## 2015-04-19 DIAGNOSIS — M1990 Unspecified osteoarthritis, unspecified site: Secondary | ICD-10-CM | POA: Diagnosis present

## 2015-04-19 DIAGNOSIS — Z79899 Other long term (current) drug therapy: Secondary | ICD-10-CM | POA: Diagnosis not present

## 2015-04-19 DIAGNOSIS — Z993 Dependence on wheelchair: Secondary | ICD-10-CM | POA: Diagnosis not present

## 2015-04-19 DIAGNOSIS — Z91010 Allergy to peanuts: Secondary | ICD-10-CM | POA: Diagnosis not present

## 2015-04-19 DIAGNOSIS — E039 Hypothyroidism, unspecified: Secondary | ICD-10-CM | POA: Diagnosis present

## 2015-04-19 DIAGNOSIS — M109 Gout, unspecified: Secondary | ICD-10-CM | POA: Diagnosis present

## 2015-04-19 DIAGNOSIS — Z794 Long term (current) use of insulin: Secondary | ICD-10-CM | POA: Diagnosis not present

## 2015-04-19 DIAGNOSIS — W010XXA Fall on same level from slipping, tripping and stumbling without subsequent striking against object, initial encounter: Secondary | ICD-10-CM | POA: Diagnosis present

## 2015-04-19 DIAGNOSIS — I10 Essential (primary) hypertension: Secondary | ICD-10-CM | POA: Diagnosis present

## 2015-04-19 DIAGNOSIS — Z66 Do not resuscitate: Secondary | ICD-10-CM | POA: Diagnosis present

## 2015-04-19 DIAGNOSIS — I959 Hypotension, unspecified: Secondary | ICD-10-CM | POA: Diagnosis present

## 2015-04-19 DIAGNOSIS — Z79891 Long term (current) use of opiate analgesic: Secondary | ICD-10-CM | POA: Diagnosis not present

## 2015-04-19 LAB — BASIC METABOLIC PANEL
ANION GAP: 9 (ref 5–15)
BUN: 20 mg/dL (ref 6–20)
CALCIUM: 8.4 mg/dL — AB (ref 8.9–10.3)
CO2: 21 mmol/L — AB (ref 22–32)
CREATININE: 0.83 mg/dL (ref 0.44–1.00)
Chloride: 111 mmol/L (ref 101–111)
GFR calc Af Amer: >60 mL/min (ref >60)
Glucose, Bld: 87 mg/dL (ref 65–99)
Potassium: 3.4 mmol/L — ABNORMAL LOW (ref 3.5–5.1)
Sodium: 141 mmol/L (ref 135–145)

## 2015-04-19 LAB — GLUCOSE, CAPILLARY
GLUCOSE-CAPILLARY: 118 mg/dL — AB (ref 65–99)
Glucose-Capillary: 80 mg/dL (ref 65–99)
Glucose-Capillary: 90 mg/dL (ref 65–99)
Glucose-Capillary: 113 mg/dL — ABNORMAL HIGH (ref 65–99)

## 2015-04-19 LAB — CBC
HCT: 32.2 % — ABNORMAL LOW (ref 36.0–46.0)
Hemoglobin: 10.5 g/dL — ABNORMAL LOW (ref 12.0–15.0)
MCH: 31.7 pg (ref 26.0–34.0)
MCHC: 32.6 g/dL (ref 30.0–36.0)
MCV: 97.3 fL (ref 78.0–100.0)
Platelets: 198 10*3/uL (ref 150–400)
RBC: 3.31 MIL/uL — ABNORMAL LOW (ref 3.87–5.11)
RDW: 15.5 % (ref 11.5–15.5)
WBC: 9.5 10*3/uL (ref 4.0–10.5)

## 2015-04-19 MED ORDER — METOPROLOL TARTRATE 12.5 MG HALF TABLET
12.5000 mg | ORAL_TABLET | Freq: Two times a day (BID) | ORAL | Status: DC
  Administered 2015-04-19 – 2015-04-20 (×3): 12.5 mg via ORAL
  Filled 2015-04-19 (×5): qty 1

## 2015-04-19 NOTE — Progress Notes (Signed)
[facility] PROGRESS NOTE  Kaitlyn Burns ZOX:[id number] DOB: [birth year] DOA: 04/17/2015 PCP: No primary care provider on file.  Assessment/Plan: 1. Hypotension -due to hypovolemia, vomiting, no s/s of sepsis -improved and stable, stopped IVF  2. UTI -continue rocephin, FU Urine Cx  3. Diabetes mellitus -stable, SSI  4. Hypothyroidism on Synthroid.  5. Hypertension  -stable, lasix on hold, continue lopressor, diltiazem  6. Gout on ULORIC.  Code Status: DNR Family Communication: none at bedside Disposition Plan: home     HPI/Subjective: Feels better  Objective: Filed Vitals:   04/19/15 1108  BP: 144/67  Pulse: 85  Temp:   Resp:     Intake/Output Summary (Last 24 hours) at 04/19/15 1302 Last data filed at 04/19/15 0500  Gross per 24 hour  Intake 866.33 ml  Output    950 ml  Net -83.67 ml   Filed Weights   04/18/15 0306  Weight: 44.77 kg (98 lb 11.2 oz)    Exam:   General:  AAOx3  Cardiovascular: S1S2/RRR  Respiratory: CTAB  Abdomen: soft, Nt, BS present  Musculoskeletal: no edema c/c   Data Reviewed: Basic Metabolic Panel:  Recent Labs Lab 04/16/15 2151 04/17/15 2203 04/18/15 0728 04/19/15 0428  NA 140 137 143 141  K 3.1* 3.2* 4.8 3.4*  CL 102 102 107 111  CO2 21*  GLUCOSE 142* 117* 106* 87  BUN 37* 33* 26* 20  CREATININE 1.31* 1.31* 1.09* 0.83  CALCIUM 10.6* 9.2 9.3 8.4*   Liver Function Tests:  Recent Labs Lab 04/18/15 0728  AST 22  ALT 12*  ALKPHOS 53  BILITOT 0.5  PROT 6.5  ALBUMIN 3.1*   No results for input(s): LIPASE, AMYLASE in the last 168 hours. No results for input(s): AMMONIA in the last 168 hours. CBC:  Recent Labs Lab 04/16/15 2151 04/17/15 2203 04/18/15 0728 04/19/15 0428  WBC 13.9* 10.0 10.4 9.5  NEUTROABS 10.9* 6.2 5.8  --   HGB 12.5 10.4* 11.4* 10.5*  HCT 38.1 31.3* 34.5* 32.2*  MCV 95.5 94.3 96.9 97.3  PLT 250 197 210 198   Cardiac Enzymes:  Recent Labs Lab 04/16/15 2151   TROPONINI <0.03   BNP (last 3 results) No results for input(s): BNP in the last 8760 hours.  ProBNP (last 3 results) No results for input(s): PROBNP in the last 8760 hours.  CBG:  Recent Labs Lab 04/18/15 1140 04/18/15 1627 04/18/15 2157 04/19/15 0627 04/19/15 1119  GLUCAP 169* 169* 84 80 90    Recent Results (from the past 240 hour(s))  C difficile quick scan w PCR reflex     Status: None   Collection Time: 04/18/15  4:15 PM  Result Value Ref Range Status   C Diff antigen NEGATIVE NEGATIVE Final   C Diff toxin NEGATIVE NEGATIVE Final   C Diff interpretation Negative for toxigenic C. difficile  Final     Studies: No results found.  Scheduled Meds: . amitriptyline  10 mg Oral QHS  . calcium-vitamin D  1 tablet Oral Q breakfast  . cefTRIAXone (ROCEPHIN)  IV  1 g Intravenous Q24H  . diltiazem  120 mg Oral Daily  . divalproex  250 mg Oral BID  . DULoxetine  20 mg Oral Q M,W,F  . enoxaparin (LOVENOX) injection  30 mg Subcutaneous Q24H  . febuxostat  40 mg Oral Daily  . insulin aspart  0-9 Units Subcutaneous TID WC  . latanoprost  1 drop Both Eyes QHS  . levothyroxine  75 mcg  Oral QAC breakfast  . magnesium oxide  400 mg Oral Daily  . metoprolol tartrate  12.5 mg Oral BID  . QUEtiapine  50 mg Oral BID   Continuous Infusions:  Antibiotics Given (last 72 hours)    Date/Time Action Medication Dose Rate   04/18/15 0702 Given   cefTRIAXone (ROCEPHIN) 1 g in dextrose 5 % 50 mL IVPB 1 g 100 mL/hr   04/19/15 0644 Given   cefTRIAXone (ROCEPHIN) 1 g in dextrose 5 % 50 mL IVPB 1 g 100 mL/hr      Principal Problem:   Hypotension Active Problems:   Hypertension   Hypothyroidism   Diabetes mellitus type 2, controlled   Arterial hypotension    Time spent:    [facility]  [facility] Pager [phone]. If 7PM-7AM, please contact night-coverage at [email or web address], password [email or web address] 04/19/2015, 1:02 PM  LOS: 0 days

## 2015-04-19 NOTE — Progress Notes (Signed)
Physical Therapy Treatment Patient Details Name: Kaitlyn Burns MRN: [id number] DOB: 30-Nov-1924 Today's Date: 04/19/2015    History of Present Illness s/p N/V for several days; seen in ED 8/9 with dehydration and IV fluids given; returned to ALFand again presented to ED 8/10 with fall and same symptoms PMHx-diabetes mellitus type 2, hypothyroidism, hypertension, gout     PT Comments    Progressing with mobility. Pt did well with transfer-Min guard assist and increased time. Feel pt could return to ALF with HHPT follow up. Pt did report mild lightheadedness during session. BP: supine- 128/73, sitting-123/74, end of session-144/67.   Follow Up Recommendations  Home health PT;Supervision for mobility/OOB (at ALF)     Equipment Recommendations  None recommended by PT    Recommendations for Other Services       Precautions / Restrictions Precautions Precautions: Fall Restrictions Weight Bearing Restrictions: No    Mobility  Bed Mobility Overal bed mobility: Needs Assistance Bed Mobility: Supine to Sit     Supine to sit: Min guard     General bed mobility comments: + use of rail; states she sleeps on several pillows at home. close guard for safety. Increased time.   Transfers Overall transfer level: Needs assistance   Transfers: Sit to/from Stand;Stand Pivot Transfers Sit to Stand: Min guard Stand pivot transfers: Min guard       General transfer comment: close guard for safety. Increased time. Modified stand pivot from bed to recliner with use of armrest for support  Ambulation/Gait             General Gait Details: NT-pt non ambulatory   Stairs            Wheelchair Mobility    Modified Rankin (Stroke Patients Only)       Balance   Sitting-balance support: Feet unsupported Sitting balance-Leahy Scale: Good                              Cognition Arousal/Alertness: Awake/alert Behavior During Therapy: WFL for tasks  assessed/performed Overall Cognitive Status: Within Functional Limits for tasks assessed                      Exercises      General Comments        Pertinent Vitals/Pain Pain Assessment: Faces Faces Pain Scale: Hurts little more Pain Location: R knee, head Pain Descriptors / Indicators: Aching Pain Intervention(s): Limited activity within patient's tolerance;Repositioned    Home Living                      Prior Function            PT Goals (current goals can now be found in the care plan section) Progress towards PT goals: Progressing toward goals    Frequency  Min 3X/week    PT Plan Discharge plan needs to be updated    Co-evaluation             End of Session   Activity Tolerance: Patient tolerated treatment well Patient left: in chair;with call bell/phone within reach;with chair alarm set     Time: 1045-1103 PT Time Calculation (min) (ACUTE ONLY): 18 min  Charges:  $Therapeutic Activity: 8-22 mins                    G Codes:      Rebeca Alert, MPT Pager: [phone]

## 2015-04-20 DIAGNOSIS — N390 Urinary tract infection, site not specified: Secondary | ICD-10-CM | POA: Diagnosis present

## 2015-04-20 LAB — GLUCOSE, CAPILLARY
GLUCOSE-CAPILLARY: 85 mg/dL (ref 65–99)
Glucose-Capillary: 102 mg/dL — ABNORMAL HIGH (ref 65–99)
Glucose-Capillary: 111 mg/dL — ABNORMAL HIGH (ref 65–99)

## 2015-04-20 LAB — CBC
HEMATOCRIT: 32.9 % — AB (ref 36.0–46.0)
HEMOGLOBIN: 10.7 g/dL — AB (ref 12.0–15.0)
MCH: 31.5 pg (ref 26.0–34.0)
MCHC: 32.5 g/dL (ref 30.0–36.0)
MCV: 96.8 fL (ref 78.0–100.0)
Platelets: 204 10*3/uL (ref 150–400)
RBC: 3.4 MIL/uL — AB (ref 3.87–5.11)
RDW: 15.7 % — ABNORMAL HIGH (ref 11.5–15.5)
WBC: 9.5 10*3/uL (ref 4.0–10.5)

## 2015-04-20 LAB — BASIC METABOLIC PANEL
Anion gap: 7 (ref 5–15)
BUN: 14 mg/dL (ref 6–20)
CALCIUM: 8.9 mg/dL (ref 8.9–10.3)
CO2: 24 mmol/L (ref 22–32)
CREATININE: 0.69 mg/dL (ref 0.44–1.00)
Chloride: 111 mmol/L (ref 101–111)
GFR calc Af Amer: >60 mL/min (ref >60)
GFR calc non Af Amer: >60 mL/min (ref >60)
GLUCOSE: 99 mg/dL (ref 65–99)
Potassium: 4.3 mmol/L (ref 3.5–5.1)
SODIUM: 142 mmol/L (ref 135–145)

## 2015-04-20 LAB — URINE CULTURE

## 2015-04-20 MED ORDER — CEPHALEXIN 250 MG PO CAPS: 250.0000 mg | ORAL_CAPSULE | Freq: Four times a day (QID) | ORAL | Status: DC

## 2015-04-20 MED ORDER — LOPERAMIDE HCL 2 MG PO CAPS
2.0000 mg | ORAL_CAPSULE | ORAL | Status: DC | PRN
  Administered 2015-04-20: 2 mg via ORAL

## 2015-04-20 NOTE — Clinical Social Work Placement (Addendum)
   CLINICAL SOCIAL WORK PLACEMENT  NOTE  Date:  04/20/2015  Patient Details  Name: Kaitlyn Burns MRN: [id number] Date of Birth: [birth year]  Clinical Social Work is seeking post-discharge placement for this patient at the   level of care (*CSW will initial, date and re-position this form in  chart as items are completed):      Patient/family provided with [facility] Clinical Social Work Department's list of facilities offering this level of care within the geographic area requested by the patient (or if unable, by the patient's family).      Patient/family informed of their freedom to choose among providers that offer the needed level of care, that participate in Medicare, Medicaid or managed care program needed by the patient, have an available bed and are willing to accept the patient.      Patient/family informed of [facility]'s ownership interest in [facility] and [facility], as well as of the fact that they are under no obligation to receive care at these facilities.  PASRR submitted to EDS on       PASRR number received on       Existing PASRR number confirmed on       FL2 transmitted to all facilities in geographic area requested by pt/family on       FL2 transmitted to all facilities within larger geographic area on       Patient informed that his/her managed care company has contracts with or will negotiate with certain facilities, including the following:            Patient/family informed of bed offers received.  Patient chooses bed at  Return to [facility] - ALF     Physician recommends and patient chooses bed at      Patient to be transferred to  [facility] on  04/20/15.  Patient to be transferred to facility by  [facility]     Patient family notified on  04/20/15 of transfer.  Name of family member notified:    Husband and facility.    PHYSICIAN       Additional Comment:    _______________________________________________ Beverly Sessions,  LCSW 04/20/2015, 1:56 PM

## 2015-04-20 NOTE — Care Management Note (Signed)
Case Management Note  Patient Details  Name: Kaitlyn Burns MRN: [id number] Date of Birth: 1925-08-22  Subjective/Objective:                  Principal Problem:  Hypotension  Action/Plan: CM spoke to patient at the bedside. Patient to be discharged to ALF [facility]. CM called and confirmed with RN [facility] @ [phone] who states that they have in house PT that will be able to provide the [facility] physical therapy for patient. No further CM needs communicated and SW also collaborating for discharge needs.    Expected Discharge Date:  04/20/15               Expected Discharge Plan:  Assisted Living / Rest Home  In-House Referral:  Clinical Social Work  Discharge planning Services  CM Consult  Post Acute Care Choice:    Choice offered to:     DME Arranged:    DME Agency:     HH Arranged:  PT HH Agency:  Other - See comment (Through [facility])  Status of Service:  Completed, signed off  Medicare Important Message Given:    Date Medicare IM Given:    Medicare IM give by:    Date Additional Medicare IM Given:    Additional Medicare Important Message give by:     If discussed at Long Length of Stay Meetings, dates discussed:    Additional Comments:  Darcel Smalling, RN 04/20/2015, 2:10 PM

## 2015-04-20 NOTE — Discharge Summary (Signed)
Physician Discharge Summary  Kaitlyn Burns ZOX:[id number] DOB: 1925-03-19 DOA: 04/17/2015  PCP: No primary care provider on file.  Admit date: 04/17/2015 Discharge date: 04/20/2015  Time spent: 45 minutes  Recommendations for Outpatient Follow-up:  1. PCP in 1 week, was on lasix 3 times a week which was discontinued and NSAID stopped, please assess volume status and need for low dose lasix at FU  Discharge Diagnoses:  Principal Problem:   Hypotension Active Problems:   Hypertension   Hypothyroidism   Diabetes mellitus type 2, controlled   Arterial hypotension   UTI (urinary tract infection)   Arthritis   Wheel chair bound  Discharge Condition: stable  Diet recommendation: diabetic  Filed Weights   04/18/15 0306  Weight: 44.77 kg (98 lb 11.2 oz)    History of present illness:  Chief Complaint: Low blood pressure. HPI: Kaitlyn Burns is a 81 y.o. female with history of diabetes mellitus type 2, hypothyroidism, hypertension, gout was brought to the ER after patient was following the floor. When EMS reached patient's assisted living facility patient blood pressure was found to be in the 80s systolic. Patient had come to the ER one day previously for nausea vomiting and at that time patient was given IV fluids and discharged back to assisted living facility. Patient is largely wheelchair-bound and patient states she was trying to go to the bathroom when she suddenly tripped and fell. Patient stated her nausea vomiting had resolved. Patient was given 1 L normal saline bolus following which the pressure has improved  Hospital Course:  1. Hypotension -due to hypovolemia, vomiting and diuretics, no s/s of sepsis -improved with gentle hydration -lasix stopped  2. UTI -was partially treated prior to admission, treated with IV rocephin inpatient -urine Cx negative likely since partially treated, continue keflex for 2 more days after discharge  3. Diabetes mellitus -stable, SSI at ALF  too  4. Hypothyroidism on Synthroid.  5. Hypertension  -stable, lasix stopped, continue lopressor, diltiazem  6. Gout on ULORIC   Discharge Exam: Filed Vitals:   04/20/15 0514  BP: 141/69  Pulse: 73  Temp: 97.5 F (36.4 C)  Resp: 19    General: AAOx3 Cardiovascular:S1S2/RRR Respiratory: CTAB  Discharge Instructions   Discharge Instructions    Diet - low sodium heart healthy    Complete by:  As directed      Diet Carb Modified    Complete by:  As directed      Increase activity slowly    Complete by:  As directed           Current Discharge Medication List    CONTINUE these medications which have CHANGED   Details  cephALEXin (KEFLEX) 250 MG capsule Take 1 capsule (250 mg total) by mouth 4 (four) times daily. For 2 more days Refills: 0      CONTINUE these medications which have NOT CHANGED   Details  Amino Acids-Protein Hydrolys (FEEDING SUPPLEMENT, PRO-STAT SUGAR FREE 64,) LIQD Take 30 mLs by mouth 2 (two) times daily.     amitriptyline (ELAVIL) 10 MG tablet Take 10 mg by mouth at bedtime.    Calcium Carb-Cholecalciferol (CALCIUM 500+D) 500-200 MG-UNIT TABS Take 1 tablet by mouth daily.    capsaicin (ZOSTRIX) 0.025 % cream Apply 1 application topically at bedtime. Applied to bottoms of feet for pain.    diclofenac sodium (VOLTAREN) 1 % GEL Apply 2 g topically 4 (four) times daily. Applied to lower back for back pain.    diltiazem (DILACOR XR)  120 MG 24 hr capsule Take 1 capsule (120 mg total) by mouth daily.    diphenhydrAMINE-zinc acetate (BENADRYL) cream Apply 1 application topically at bedtime. Applied to bilateral legs, abdomin, & back prior to bedtime for itching.    divalproex (DEPAKOTE ER) 250 MG 24 hr tablet Take 250 mg by mouth 2 (two) times daily.     DULoxetine (CYMBALTA) 20 MG capsule Take 20 mg by mouth every Monday, Wednesday, and Friday.    febuxostat (ULORIC) 40 MG tablet Take 40 mg by mouth daily.    insulin aspart (NOVOLOG) 100  UNIT/ML injection Inject 1-9 Units into the skin 3 (three) times daily before meals. Sliding scale: 121-150= 1 unit; 151-200= 2 units; 201-250= 3 units; 251-300= 5 units; 301-350= 7 units; 351-400= 9 units    latanoprost (XALATAN) 0.005 % ophthalmic solution Place 1 drop into both eyes at bedtime.    levothyroxine (SYNTHROID, LEVOTHROID) 75 MCG tablet Take 75 mcg by mouth daily before breakfast.    Liniments (SALONPAS PAIN RELIEF PATCH) PADS Apply 1 each topically at bedtime. Applied to lower back at bedtime & remove in the morning    loperamide (IMODIUM) 2 MG capsule Take 2 mg by mouth every Monday, Wednesday, and Friday.    magnesium oxide (MAG-OX) 400 MG tablet Take 400 mg by mouth daily.    Menthol (BIOFREEZE) 10 % AERO Apply 1 spray topically 3 (three) times daily. Applied to right knee for pain.    metoprolol tartrate (LOPRESSOR) 25 MG tablet Take 0.5 tablets (12.5 mg total) by mouth 2 (two) times daily.    Multiple Vitamin (MULTIVITAMIN WITH MINERALS) TABS tablet Take 1 tablet by mouth daily.    nystatin-triamcinolone (MYCOLOG II) cream Apply 1 application topically 2 (two) times daily. Applied before and after tedhose    ondansetron (ZOFRAN) 4 MG tablet Take 4 mg by mouth every 8 (eight) hours as needed for nausea or vomiting.    polyvinyl alcohol (LIQUIFILM TEARS) 1.4 % ophthalmic solution Place 1 drop into both eyes as directed. 6 times daily as needed for dry eyes.    Pramox-PE-Glycerin-Petrolatum (PREPARATION H) 1-0.25-14.4-15 % CREA Place 1 application rectally 2 (two) times daily.    QUEtiapine (SEROQUEL) 50 MG tablet Take 50 mg by mouth 2 (two) times daily.    traMADol (ULTRAM) 50 MG tablet Take 1 tablet (50 mg total) by mouth every 6 (six) hours as needed. Qty: 30 tablet, Refills: 0    acetaminophen (TYLENOL) 325 MG tablet Take 650 mg by mouth every 4 (four) hours as needed (for pain).    ENSURE PLUS (ENSURE PLUS) LIQD Take 237 mLs by mouth 2 (two) times daily between  meals. Ensure Plus Vanilla    hydrocortisone (ANUSOL-HC) 25 MG suppository Place 25 mg rectally 2 (two) times daily as needed for hemorrhoids.     traZODone (DESYREL) 50 MG tablet Take 1 tablet (50 mg total) by mouth at bedtime as needed for sleep. Qty: 30 tablet, Refills: 0      STOP taking these medications     furosemide (LASIX) 40 MG tablet      meloxicam (MOBIC) 7.5 MG tablet      polyethylene glycol (MIRALAX / GLYCOLAX) packet      potassium chloride SA (K-DUR,KLOR-CON) 20 MEQ tablet        Allergies  Allergen Reactions  . Ciprofloxacin Other (See Comments)    Unknown reaction (MAR)  . Lorazepam Other (See Comments)    confusion  . Teflaro [Ceftaroline] Other (See Comments)  Confusion/delirium  . Gabapentin Rash  . Metoclopramide Anxiety and Other (See Comments)    Irritability   . Penicillins Rash  . Tape Rash      The results of significant diagnostics from this hospitalization (including imaging, microbiology, ancillary and laboratory) are listed below for reference.    Significant Diagnostic Studies: Dg Chest Port 1 View  04/16/2015   CLINICAL DATA:  Acute onset of nausea and right upper chest and rib pain. Generalized weakness. Initial encounter.  EXAM: PORTABLE CHEST - 1 VIEW  COMPARISON:  Chest radiograph from 08/31/2014  FINDINGS: The lungs are well-aerated and clear. There is no evidence of focal opacification, pleural effusion or pneumothorax.  The cardiomediastinal silhouette is within normal limits. No acute osseous abnormalities are seen.  IMPRESSION: No acute cardiopulmonary process seen. No displaced rib fractures identified.   Electronically Signed   By: Roanna Raider M.D.   On: 04/16/2015 22:28    Microbiology: Recent Results (from the past 240 hour(s))  C difficile quick scan w PCR reflex     Status: None   Collection Time: 04/18/15  4:15 PM  Result Value Ref Range Status   C Diff antigen NEGATIVE NEGATIVE Final   C Diff toxin NEGATIVE  NEGATIVE Final   C Diff interpretation Negative for toxigenic C. difficile  Final     Labs: Basic Metabolic Panel:  Recent Labs Lab 04/16/15 2151 04/17/15 2203 04/18/15 0728 04/19/15 0428 04/20/15 0349  NA 140 137 143 141 142  K 3.1* 3.2* 4.8 3.4* 4.3  CL 102 102 107 111 111  CO2 26 25 25  21* 24  GLUCOSE 142* 117* 106* 87 99  BUN 37* 33* 26* 20 14  CREATININE 1.31* 1.31* 1.09* 0.83 0.69  CALCIUM 10.6* 9.2 9.3 8.4* 8.9   Liver Function Tests:  Recent Labs Lab 04/18/15 0728  AST 22  ALT 12*  ALKPHOS 53  BILITOT 0.5  PROT 6.5  ALBUMIN 3.1*   No results for input(s): LIPASE, AMYLASE in the last 168 hours. No results for input(s): AMMONIA in the last 168 hours. CBC:  Recent Labs Lab 04/16/15 2151 04/17/15 2203 04/18/15 0728 04/19/15 0428 04/20/15 0349  WBC 13.9* 10.0 10.4 9.5 9.5  NEUTROABS 10.9* 6.2 5.8  --   --   HGB 12.5 10.4* 11.4* 10.5* 10.7*  HCT 38.1 31.3* 34.5* 32.2* 32.9*  MCV 95.5 94.3 96.9 97.3 96.8  PLT 250 197 210 198 204   Cardiac Enzymes:  Recent Labs Lab 04/16/15 2151  TROPONINI <0.03   BNP: BNP (last 3 results) No results for input(s): BNP in the last 8760 hours.  ProBNP (last 3 results) No results for input(s): PROBNP in the last 8760 hours.  CBG:  Recent Labs Lab 04/19/15 0627 04/19/15 1119 04/19/15 1653 04/19/15 2108 04/20/15 0615  GLUCAP 80 90 113* 118* 85       Signed:  Sally Menard  [facility] 04/20/2015, 10:54 AM

## 2015-04-20 NOTE — Care Management Important Message (Signed)
Important Message  Patient Details  Name: Kaitlyn Burns MRN: [id number] Date of Birth: [birth year]   Medicare Important Message Given:  Yes-second notification given and patient verbalized understanding.     Darcel Smalling, RN 04/20/2015, 4:59 PM

## 2015-12-11 IMAGING — CT CT HEAD W/O CM
2 series · 15 of 30 positions shown, 19 images · non-contrast
Comparison: CT of the head performed 01/17/2010

CLINICAL DATA: Acute onset of confusion. Patient has not voided
today. Initial encounter.

EXAM:
CT HEAD WITHOUT CONTRAST
TECHNIQUE: Contiguous axial images were obtained from the base of the skull
through the vertex without intravenous contrast.

[Series 2: head w/o · axial · non-contrast · 0.38mm/px · z∈[-137,-7]mm · 13 of 32 slices shown, 17 images]
[im 3/32  brain]
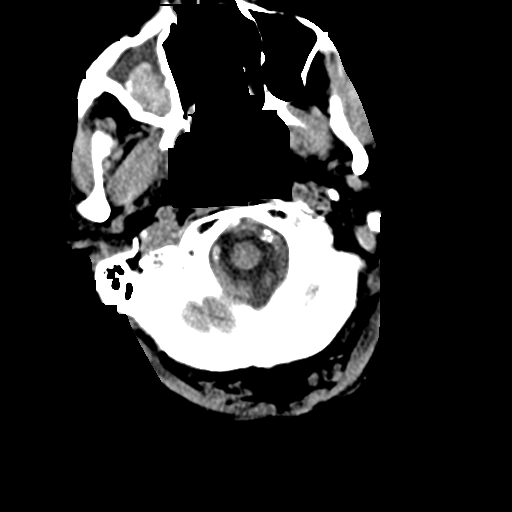
[im 3/32  bone]
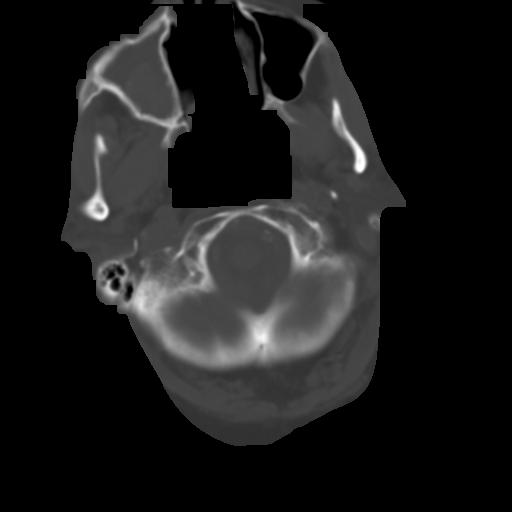
[im 5/32  brain]
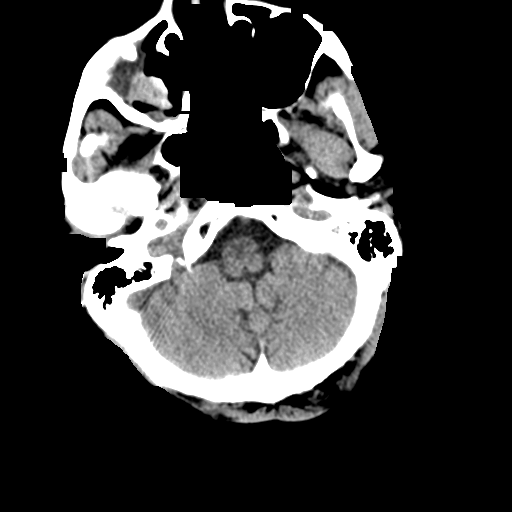
[im 7/32  brain]
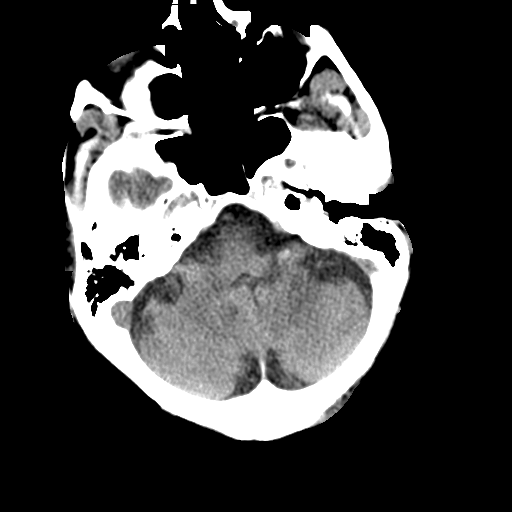
[im 9/32  brain]
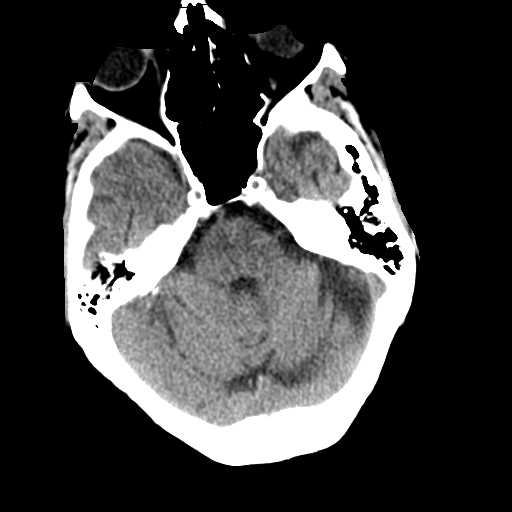
[im 12/32  brain]
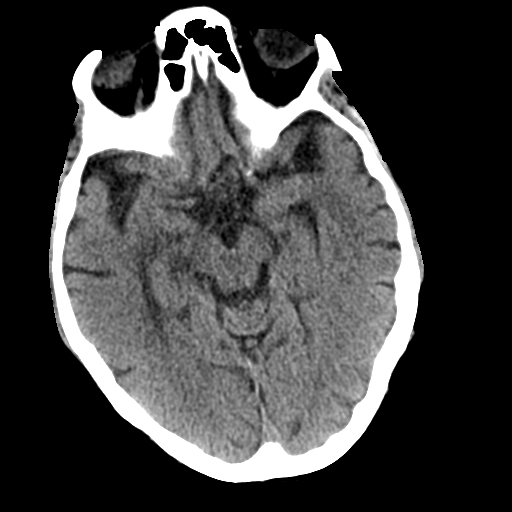
[im 12/32  bone]
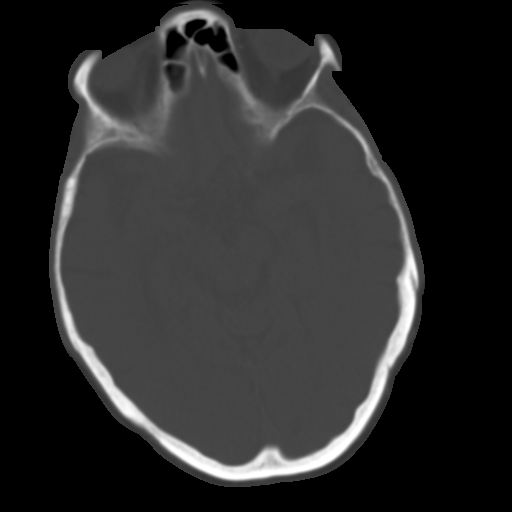
[im 14/32  brain]
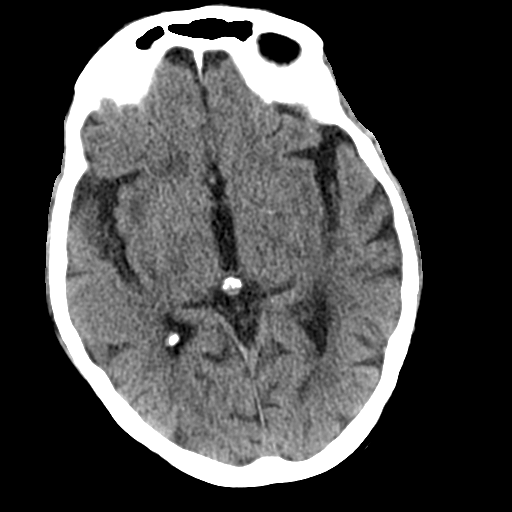
[im 16/32  brain]
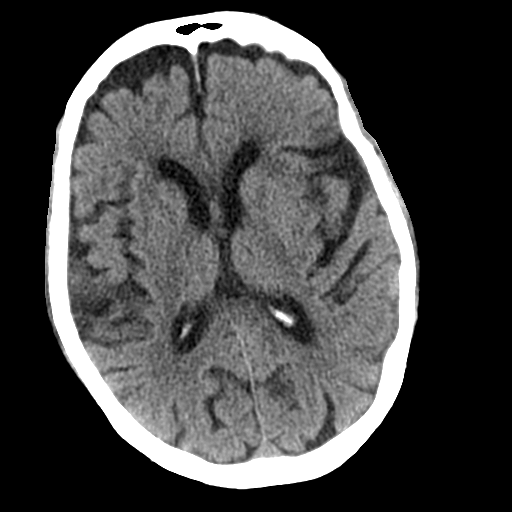
[im 18/32  brain]
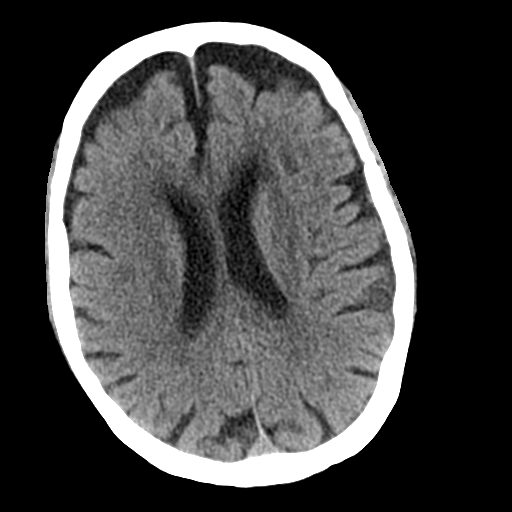
[im 20/32  brain]
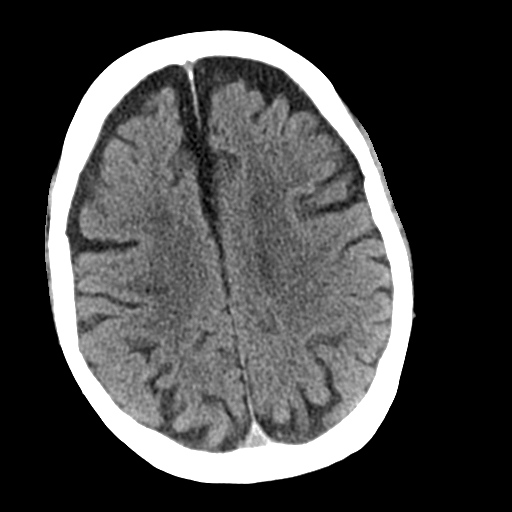
[im 20/32  bone]
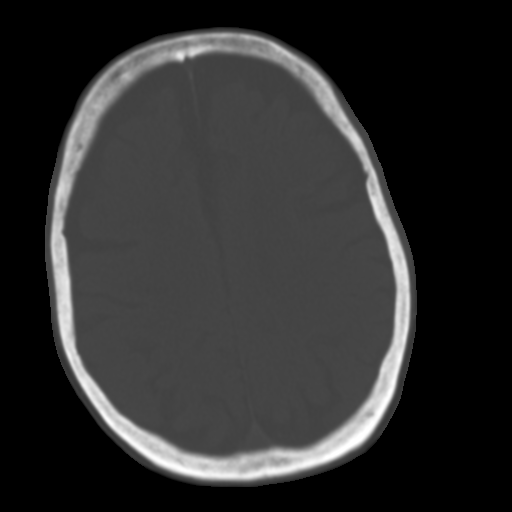
[im 23/32  brain]
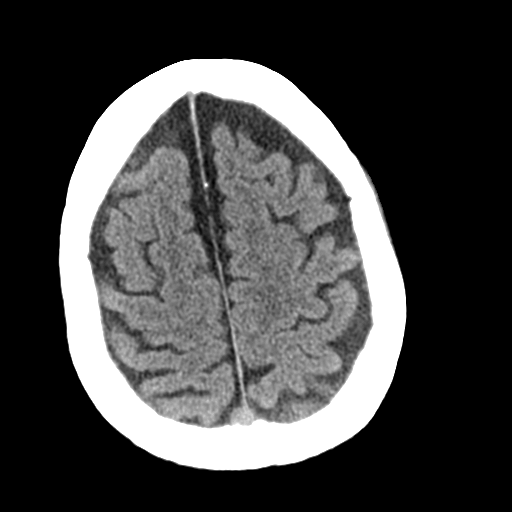
[im 25/32  brain]
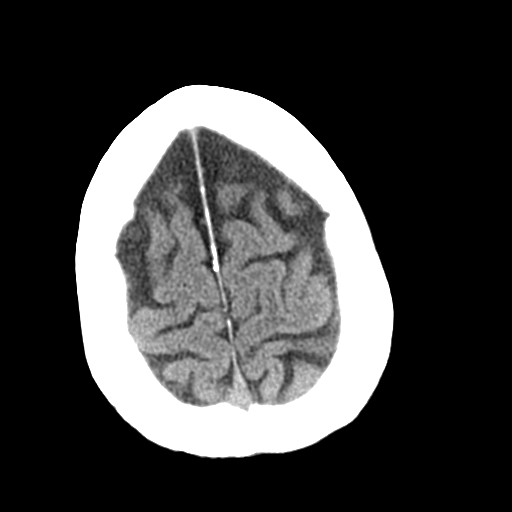
[im 27/32  brain]
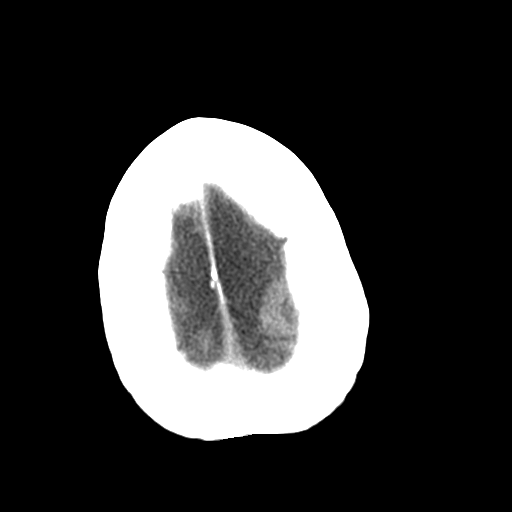
[im 29/32  brain]
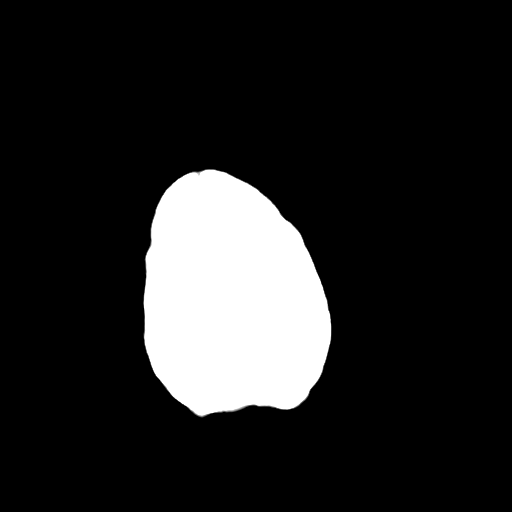
[im 29/32  bone]
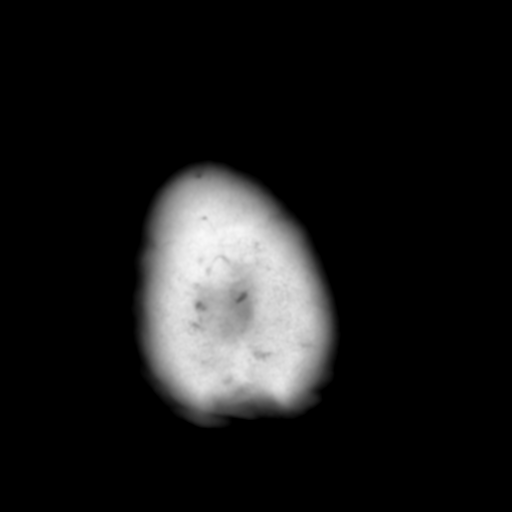

[Series 3: bone windows · axial · 0.38mm/px · z∈[-137,-117]mm · 2 of 32 slices shown]
[im 3/32  bone]
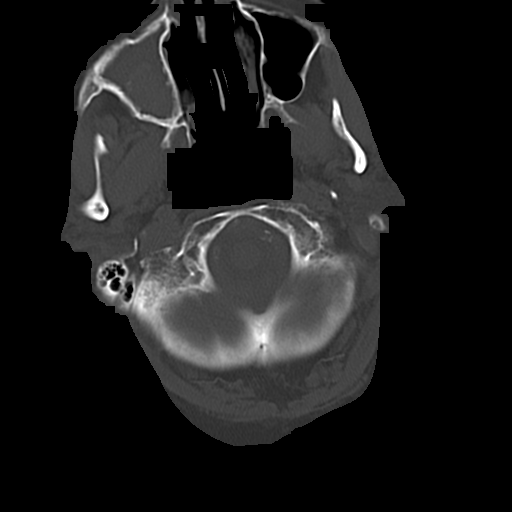
[im 7/32  bone]
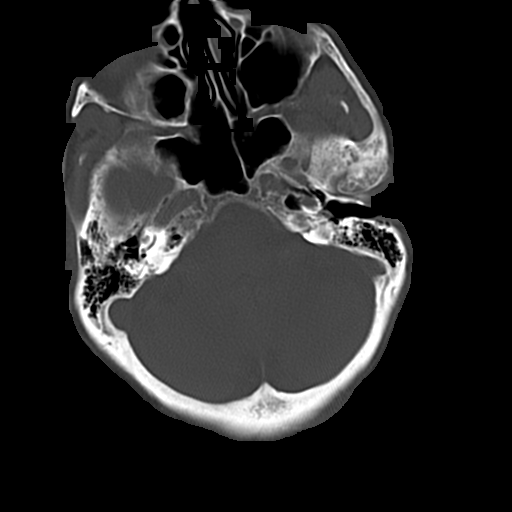

[15 of 30 positions shown; findings below may reference images not displayed]

FINDINGS: There is no evidence of acute infarction, mass lesion, or intra- or
extra-axial hemorrhage on CT.

Prominence of the ventricles and sulci reflects mild to moderate
cortical volume loss. Cerebellar atrophy is noted. Mild
periventricular and subcortical white matter change likely reflects
small vessel ischemic microangiopathy.

The brainstem and fourth ventricle are within normal limits. The
basal ganglia are unremarkable in appearance. The cerebral
hemispheres demonstrate grossly normal gray-white differentiation.
No mass effect or midline shift is seen.

There is no evidence of fracture; visualized osseous structures are
unremarkable in appearance. The orbits are within normal limits.
Dense inspissated mucus is noted within the right maxillary sinus.
The remaining paranasal sinuses and mastoid air cells are
well-aerated. No significant soft tissue abnormalities are seen.
IMPRESSION: 1. No acute intracranial pathology seen on CT.
2. Mild to moderate cortical volume loss and scattered small vessel
ischemic microangiopathy.
3. Dense inspissated mucus noted within the right maxillary sinus.

## 2015-12-11 IMAGING — DX DG CHEST 1V PORT
1 series · 1 of 1 positions shown · non-contrast
Comparison: None

CLINICAL DATA: Altered mental status, diabetes

EXAM:
PORTABLE CHEST - 1 VIEW

[AP]
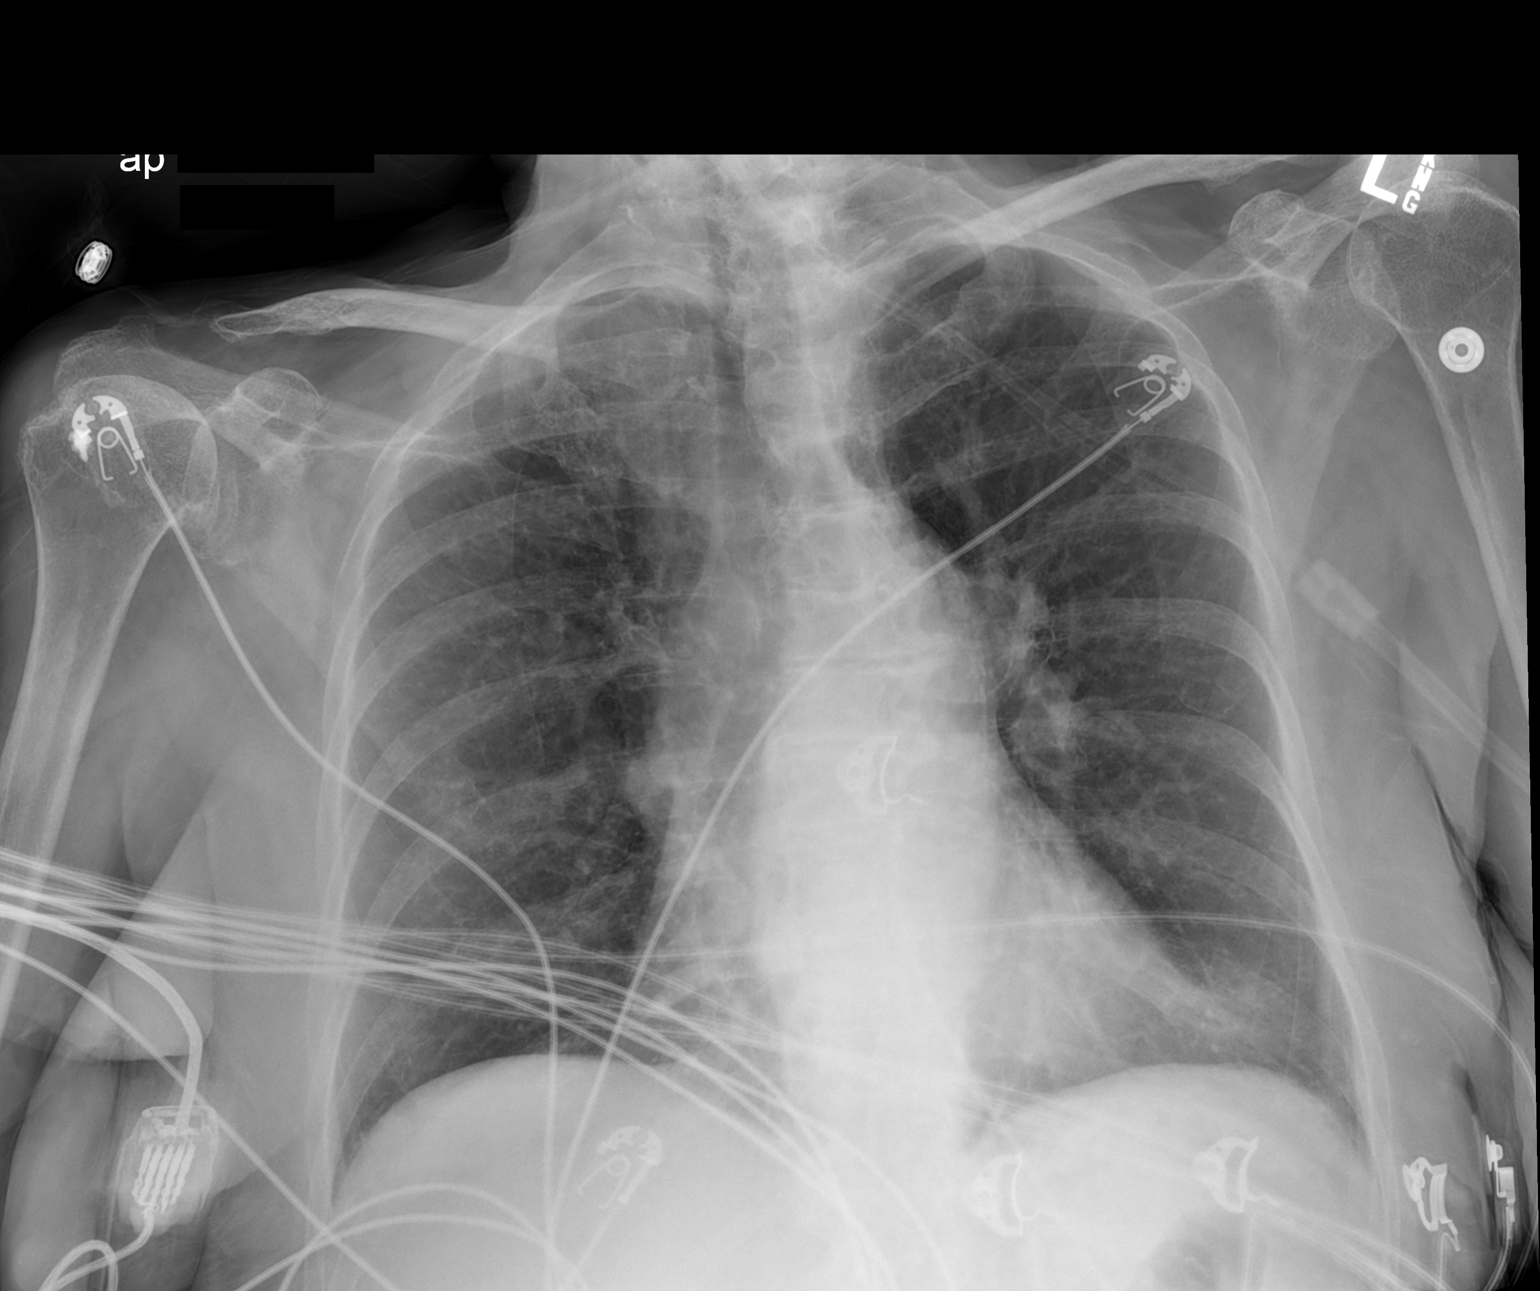

[1 of 1 positions shown; findings below may reference images not displayed]

FINDINGS: Heart size is within normal limits. The aorta is unfolded and
ectatic. No focal pulmonary opacity. Evidence of arose origin or
remote resection of the distal right clavicle. No acute osseous
abnormality. No pleural effusion.
IMPRESSION: No acute cardiopulmonary process.

## 2015-12-12 IMAGING — US US RENAL
1 series · 14 of 25 positions shown · non-contrast
Comparison: CT scan from 08/26/2014.

CLINICAL DATA: Subsequent encounter for acute on chronic renal
failure

EXAM:
RENAL/URINARY TRACT ULTRASOUND COMPLETE

[Series 1: us renal · 0.21mm/px · 14 of 28 slices shown]
[im 1/28]
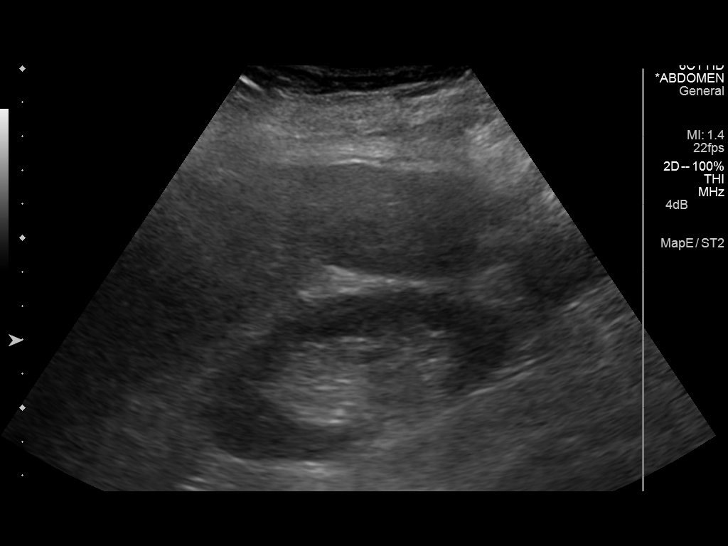
[im 3/28]
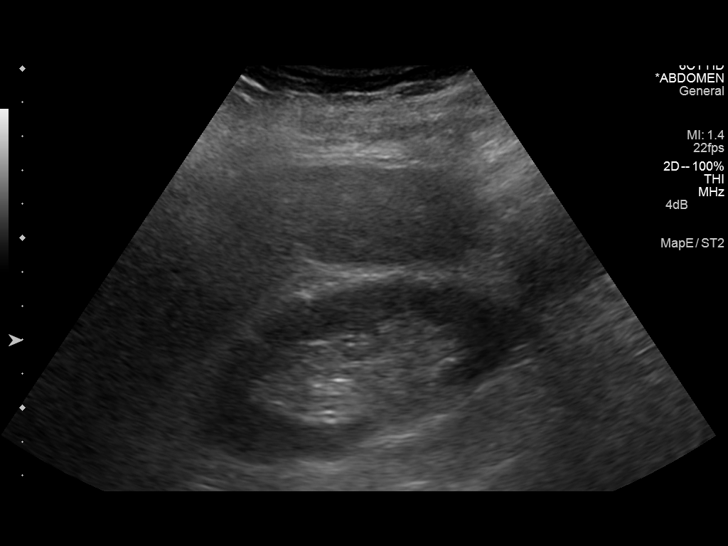
[im 5/28]
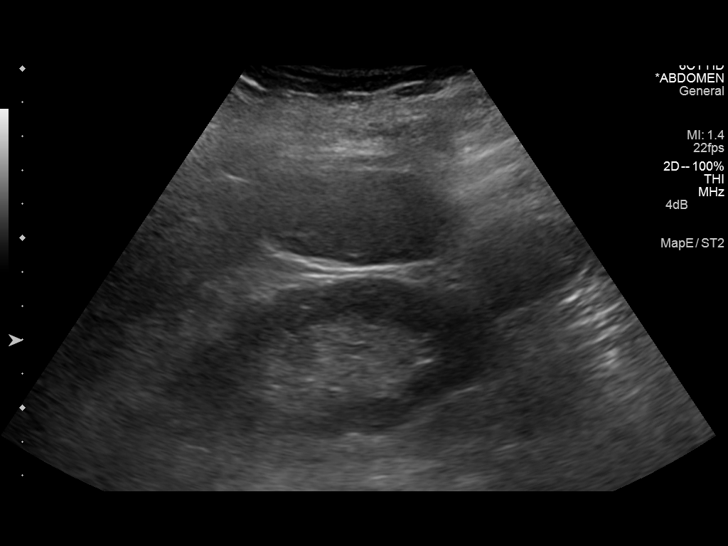
[im 7/28]
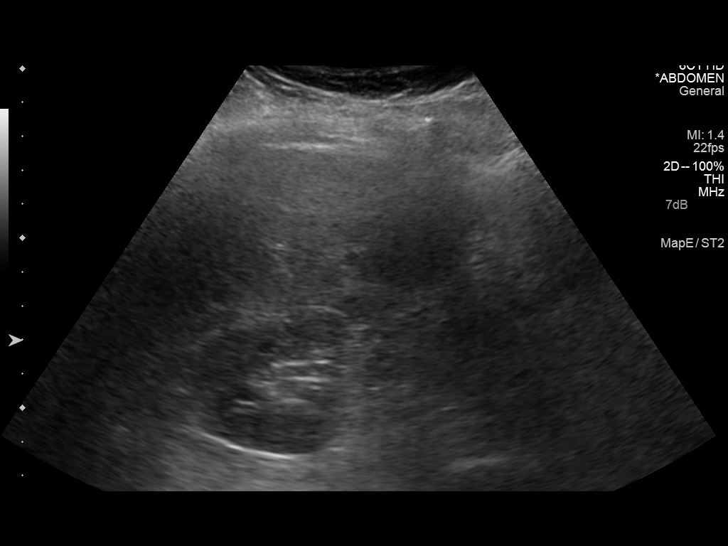
[im 10/28]
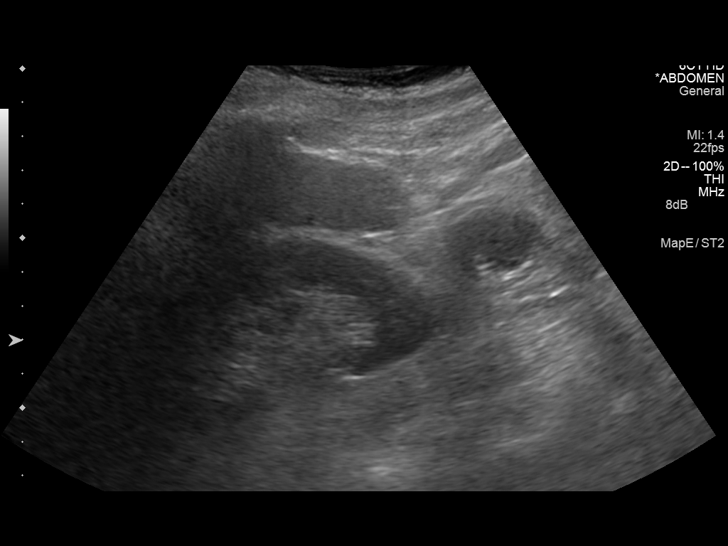
[im 11/28]
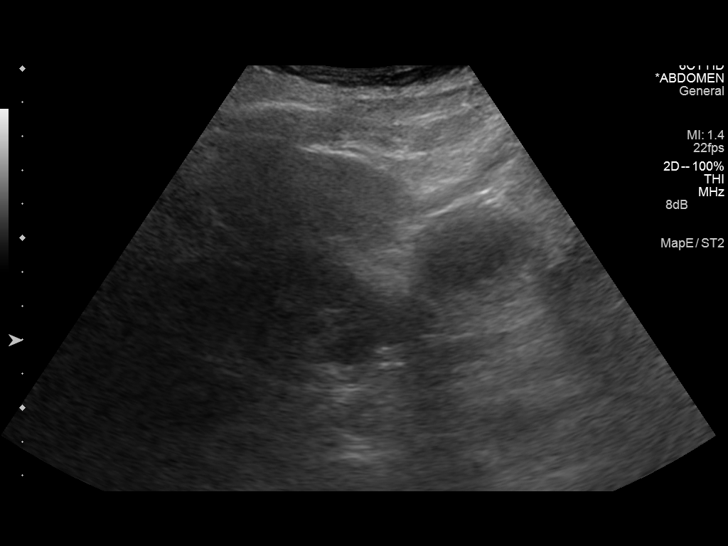
[im 13/28]
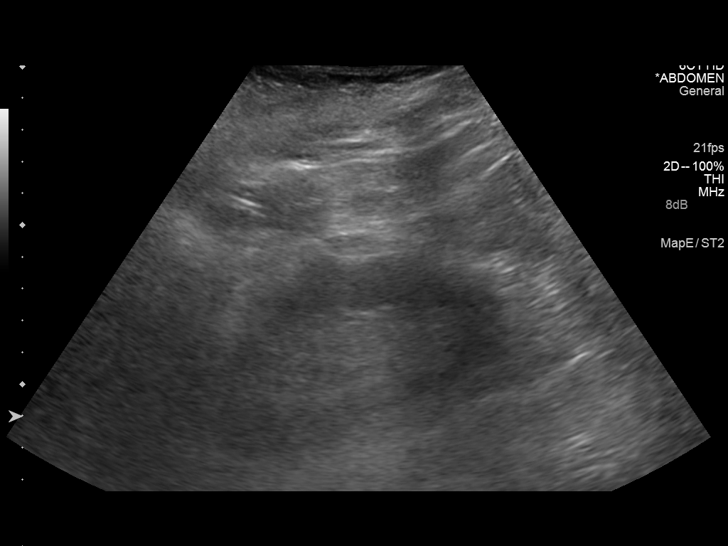
[im 15/28]
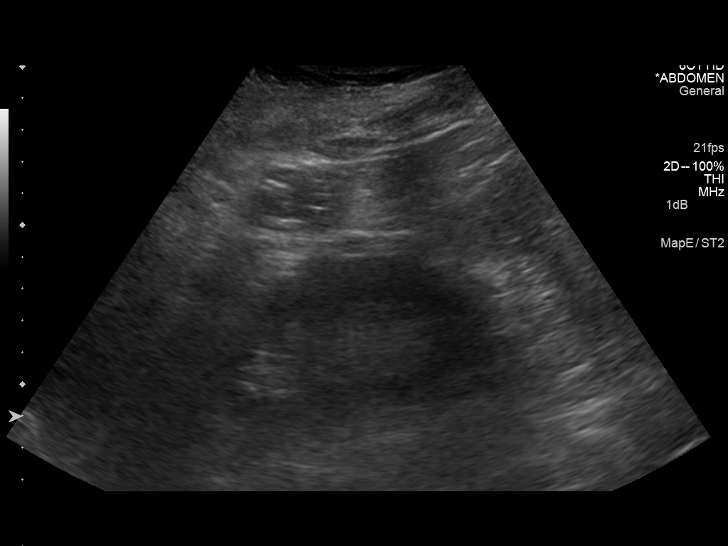
[im 17/28]
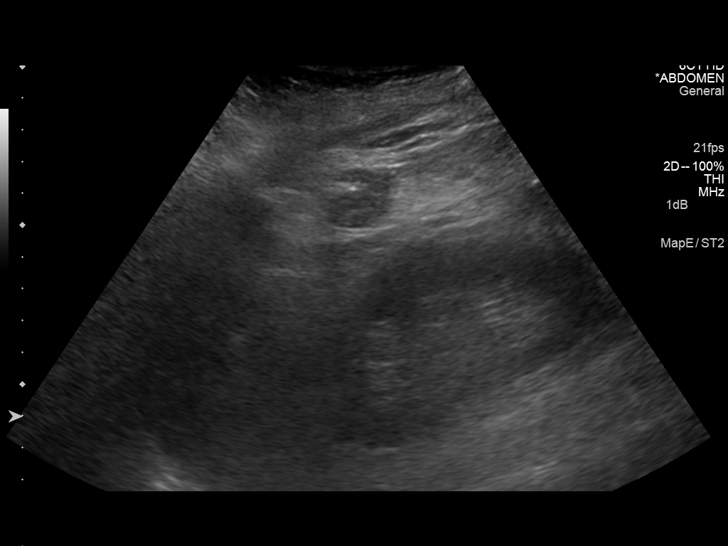
[im 19/28]
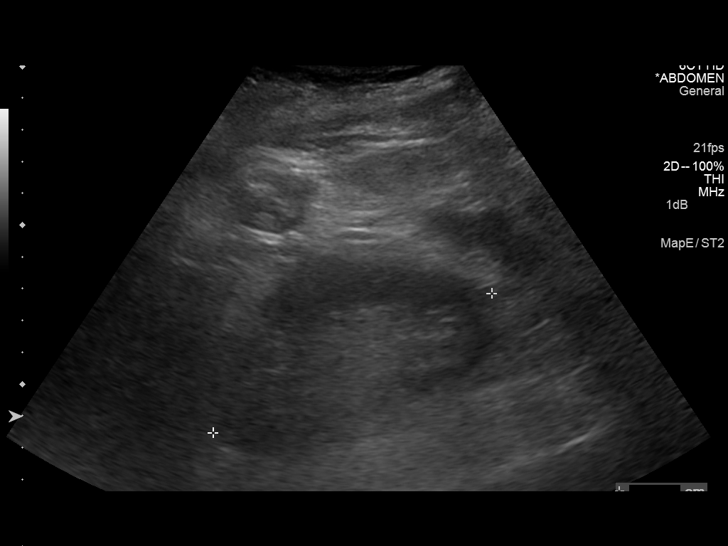
[im 21/28]
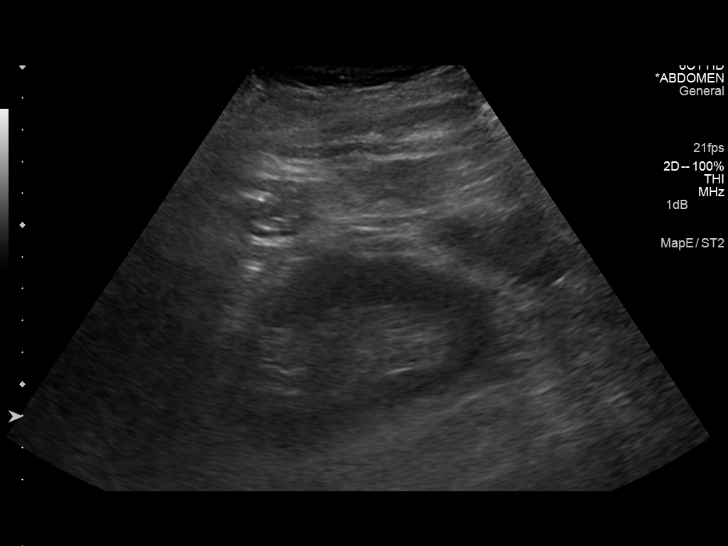
[im 23/28]
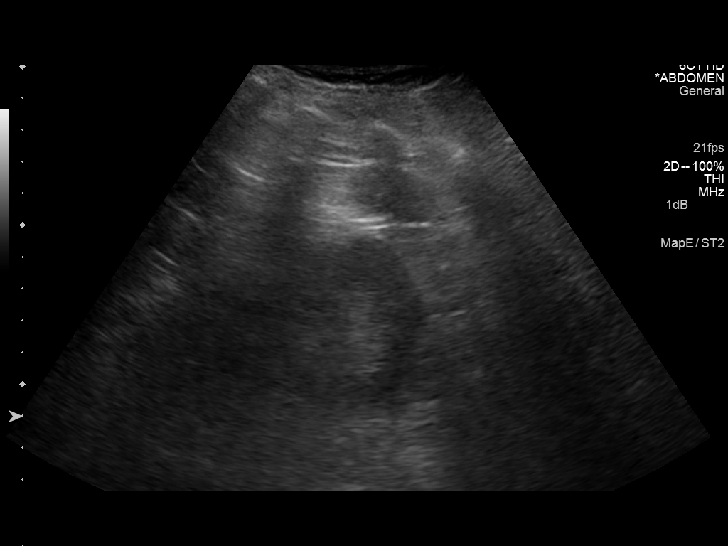
[im 25/28]
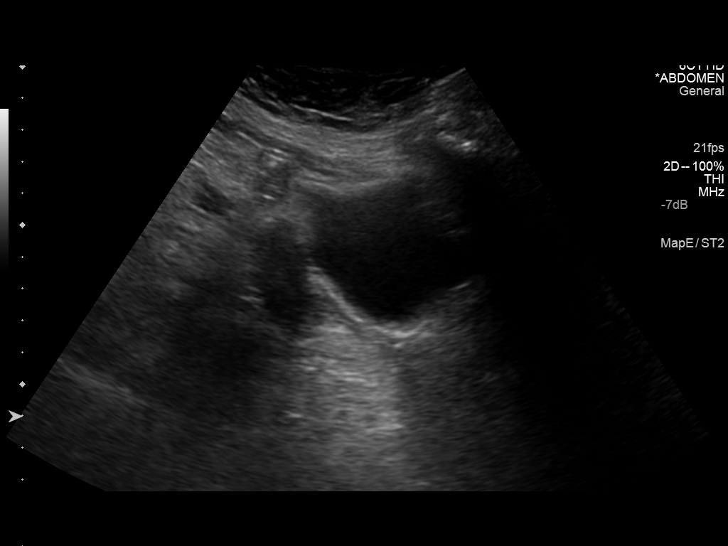
[im 28/28]
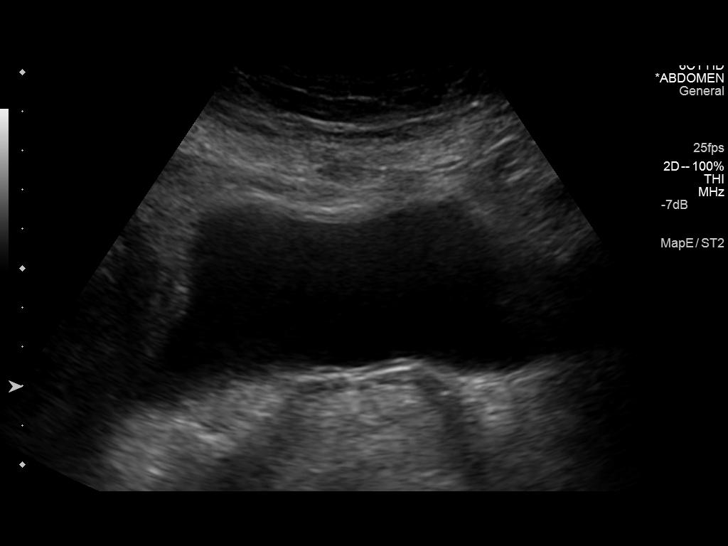

[14 of 25 positions shown; findings below may reference images not displayed]

FINDINGS: Right Kidney:

Length: 10.6 cm. Poor acoustic window limited assessment. The
low-density lesion seen on the CT scan yesterday could not be
visualized by ultrasound today.

Left Kidney:

Length: 9.8 cm. Echogenicity within normal limits. No mass or
hydronephrosis visualized.

Bladder:

Appears normal for degree of bladder distention.
IMPRESSION: Limited study secondary to poor acoustic window bilaterally. No
hydronephrosis. Right renal cyst seen on CT scan yesterday not
evident on ultrasound today.

## 2015-12-16 IMAGING — DX DG CHEST 1V PORT
1 series · 1 of 1 positions shown · non-contrast
Comparison: Portable exam 125 hr compared to 08/26/2014

CLINICAL DATA: Leukocytosis, diabetes, hypertension, kidney
disease, altered mental status

EXAM:
PORTABLE CHEST - 1 VIEW

[chest ap]
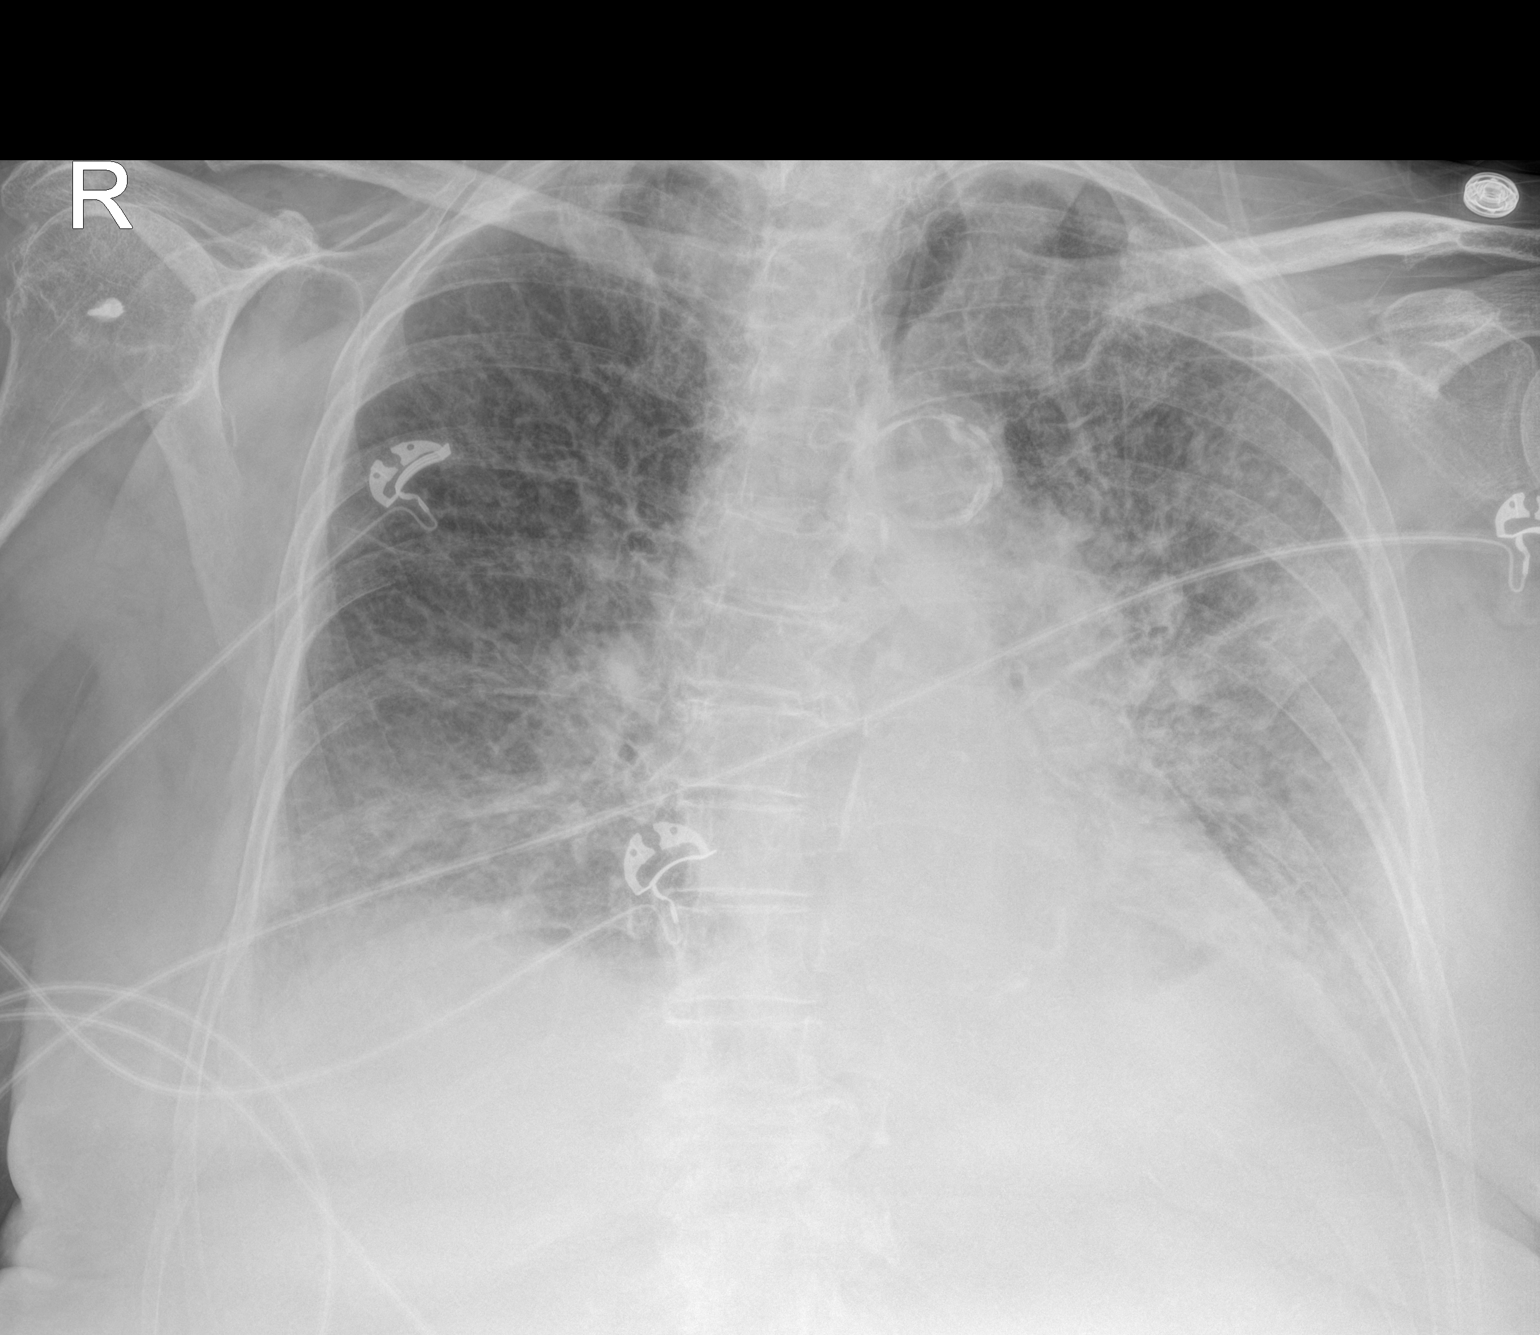

[1 of 1 positions shown; findings below may reference images not displayed]

FINDINGS: Rotated to the LEFT.

Normal heart size and mediastinal contours.

Pulmonary vascular congestion.

Atherosclerotic calcification aorta.

Scattered interstitial infiltrates diffusely new since previous exam
favor pulmonary edema though infection not completely excluded.

No gross pleural effusion or pneumothorax.

Bones demineralized.

Underlying COPD.
IMPRESSION: Diffuse interstitial infiltrates new since previous exam favor
pulmonary edema over infection.

## 2016-04-07 ENCOUNTER — Encounter (HOSPITAL_COMMUNITY): Payer: Self-pay | Admitting: Emergency Medicine

## 2016-04-07 ENCOUNTER — Emergency Department (HOSPITAL_COMMUNITY): Payer: Medicare Other

## 2016-04-07 ENCOUNTER — Emergency Department (HOSPITAL_COMMUNITY)
Admission: EM | Admit: 2016-04-07 | Discharge: 2016-04-07 | Disposition: A | Discharge status: Home / Self Care | Payer: Medicare Other | Attending: Emergency Medicine | Admitting: Emergency Medicine

## 2016-04-07 DIAGNOSIS — Y939 Activity, unspecified: Secondary | ICD-10-CM | POA: Insufficient documentation

## 2016-04-07 DIAGNOSIS — I10 Essential (primary) hypertension: Secondary | ICD-10-CM | POA: Insufficient documentation

## 2016-04-07 DIAGNOSIS — Z79899 Other long term (current) drug therapy: Secondary | ICD-10-CM | POA: Diagnosis not present

## 2016-04-07 DIAGNOSIS — Y9289 Other specified places as the place of occurrence of the external cause: Secondary | ICD-10-CM | POA: Insufficient documentation

## 2016-04-07 DIAGNOSIS — I252 Old myocardial infarction: Secondary | ICD-10-CM | POA: Diagnosis not present

## 2016-04-07 DIAGNOSIS — S8991XA Unspecified injury of right lower leg, initial encounter: Secondary | ICD-10-CM | POA: Diagnosis present

## 2016-04-07 DIAGNOSIS — Y999 Unspecified external cause status: Secondary | ICD-10-CM | POA: Diagnosis not present

## 2016-04-07 DIAGNOSIS — E119 Type 2 diabetes mellitus without complications: Secondary | ICD-10-CM | POA: Insufficient documentation

## 2016-04-07 DIAGNOSIS — S8391XA Sprain of unspecified site of right knee, initial encounter: Secondary | ICD-10-CM | POA: Insufficient documentation

## 2016-04-07 DIAGNOSIS — M25551 Pain in right hip: Secondary | ICD-10-CM | POA: Diagnosis not present

## 2016-04-07 DIAGNOSIS — W010XXA Fall on same level from slipping, tripping and stumbling without subsequent striking against object, initial encounter: Secondary | ICD-10-CM | POA: Diagnosis not present

## 2016-04-07 DIAGNOSIS — E039 Hypothyroidism, unspecified: Secondary | ICD-10-CM | POA: Diagnosis not present

## 2016-04-07 MED ORDER — TRAMADOL HCL 50 MG PO TABS
50.0000 mg | ORAL_TABLET | Freq: Once | ORAL | Status: AC
  Administered 2016-04-07: 50 mg via ORAL
  Filled 2016-04-07: qty 1

## 2016-04-07 NOTE — ED Notes (Signed)
PA at bedside.

## 2016-04-07 NOTE — Discharge Instructions (Signed)
Please use your Ultram as needed for pain, please keep the limb elevated, ice for swelling. Follow-up with orthopedic specialist for further evaluation and management. If you experience any new or worsening signs or symptoms please return to the emergency room for further evaluation and management.

## 2016-04-07 NOTE — ED Provider Notes (Signed)
[facility]-EMERGENCY DEPT Provider Note   CSN: [id number] Arrival date & time: 04/07/16  0530  First Provider Contact:  None       History   Chief Complaint Chief Complaint  Patient presents with  . Knee Pain  . Hip Pain    HPI Kaitlyn Burns is a 82 y.o. female.  HPI   87 YOF presents with right knee pain. Pt's son is at bedside and reports that Approximately one week ago patient slipped out of her chair and onto the floor. Patient lives at a nursing facility, nursing staff notes that this was not a significant fall, patient was able to get back into her chair without significant discomfort. Over the week patient has intermittently complained of right knee pain, but no significant symptoms. Nursing staff notes that they have been giving her tramadol at the facility, and was unable to give her anymore as she maxed out on her daily dose. They told son that she would have to be evaluated in the emergency room for further medication management. Son is on certain as to why patient's symptoms worsened over the last day as earlier in the week he had seen her numerous times without significant discomfort or complaints. He does note that she recently lost her husband, has a history of drug-seeking behavior, and questions if this is playing a role in today's pain.  Patient reports that she slid out of her chair as reported earlier, had some pain that has been worse over the last several days.     Past Medical History:  Diagnosis Date  . Arthritis   . Complication of anesthesia    trouble intubating surgery  . Diabetes mellitus (HCC)   . Difficult intubation   . Thyroid disease   . UTI (lower urinary tract infection)     Patient Active Problem List   Diagnosis Date Noted  . UTI (urinary tract infection) 04/20/2015  . Hypothyroidism 04/18/2015  . Diabetes mellitus type 2, controlled (HCC) 04/18/2015  . Arterial hypotension   . Hypotension 04/17/2015  . Elevated troponin I level 09/12/2014    . Parotitis 09/11/2014  . Sepsis (HCC) 09/11/2014  . Alcohol dependence with withdrawal with complication (HCC) 09/03/2014  . Opiate dependence (HCC) 09/03/2014  . Acute encephalopathy 08/27/2014  . NSTEMI (non-ST elevated myocardial infarction) (HCC) 08/27/2014  . Diabetes mellitus (HCC) 08/27/2014  . Gout 09/12/2013  . Hypertension 09/12/2013  . Diabetes (HCC) 09/12/2013  . Depression 09/12/2013  . GERD (gastroesophageal reflux disease) 09/12/2013    Past Surgical History:  Procedure Laterality Date  . ABDOMINAL HYSTERECTOMY    . ACHILLES TENDON SURGERY Left   . COLONOSCOPY N/A 08/29/2014   Procedure: COLONOSCOPY;  Surgeon: Louis Meckel, MD;  Location: [facility];  Service: Endoscopy;  Laterality: N/A;  . ESOPHAGOGASTRODUODENOSCOPY (EGD) WITH PROPOFOL N/A 08/28/2014   Procedure: ESOPHAGOGASTRODUODENOSCOPY (EGD) WITH PROPOFOL;  Surgeon: Louis Meckel, MD;  Location: [facility];  Service: Endoscopy;  Laterality: N/A;  . FOOT SURGERY Left    left heel infection that went to bone per spouse  . HEMORROIDECTOMY    . lower abdominel surgery    . MEDIAL PARTIAL KNEE REPLACEMENT    . SHOULDER SURGERY Right    "had shredded a muscle"  . THYROIDECTOMY, PARTIAL      OB History    No data available       Home Medications    Prior to Admission medications   Medication Sig Start Date End Date Taking? Authorizing Provider  acetaminophen (TYLENOL) 500 MG tablet Take 500 mg by mouth 2 (two) times daily.   Yes Historical Provider, MD  amitriptyline (ELAVIL) 10 MG tablet Take 10 mg by mouth at bedtime.   Yes Historical Provider, MD  capsaicin (ZOSTRIX) 0.025 % cream Apply 1 application topically at bedtime. Applied to bottoms of feet for pain.   Yes Historical Provider, MD  cholestyramine (QUESTRAN) 4 g packet Take 4 g by mouth 2 (two) times daily.   Yes Historical Provider, MD  diltiazem (DILACOR XR) 120 MG 24 hr capsule Take 1 capsule (120 mg total) by mouth daily. 09/20/14   Yes Osvaldo Shipper, MD  diphenhydrAMINE-zinc acetate (BENADRYL) cream Apply 1 application topically at bedtime. Applied to bilateral legs, abdomin, & back prior to bedtime for itching.   Yes Historical Provider, MD  divalproex (DEPAKOTE) 250 MG DR tablet Take 250 mg by mouth 2 (two) times daily. 03/18/16  Yes Historical Provider, MD  DULoxetine (CYMBALTA) 20 MG capsule Take 20 mg by mouth every Monday, Wednesday, and Friday.   Yes Historical Provider, MD  febuxostat (ULORIC) 40 MG tablet Take 40 mg by mouth daily.   Yes Historical Provider, MD  insulin aspart (NOVOLOG) 100 UNIT/ML injection Inject 1-9 Units into the skin 3 (three) times daily before meals. Sliding scale: 121-150= 1 unit; 151-200= 2 units; 201-250= 3 units; 251-300= 5 units; 301-350= 7 units; 351-400= 9 units   Yes Historical Provider, MD  latanoprost (XALATAN) 0.005 % ophthalmic solution Place 1 drop into both eyes at bedtime.   Yes Historical Provider, MD  levothyroxine (SYNTHROID, LEVOTHROID) 75 MCG tablet Take 75 mcg by mouth daily before breakfast.   Yes Historical Provider, MD  Liniments (SALONPAS PAIN RELIEF PATCH) PADS Apply 1 each topically at bedtime. Applied to lower back at bedtime & remove in the morning   Yes Historical Provider, MD  meloxicam (MOBIC) 7.5 MG tablet Take 7.5 mg by mouth daily.   Yes Historical Provider, MD  Multiple Vitamin (MULTIVITAMIN WITH MINERALS) TABS tablet Take 1 tablet by mouth daily.   Yes Historical Provider, MD  polycarbophil (FIBERCON) 625 MG tablet Take 625 mg by mouth daily.   Yes Historical Provider, MD  loperamide (IMODIUM) 2 MG capsule Take 2 mg by mouth every Monday, Wednesday, and Friday.    Historical Provider, MD  metoprolol tartrate (LOPRESSOR) 25 MG tablet Take 0.5 tablets (12.5 mg total) by mouth 2 (two) times daily. Patient not taking: Reported on 04/07/2016 09/20/14   Osvaldo Shipper, MD  traMADol (ULTRAM) 50 MG tablet Take 1 tablet (50 mg total) by mouth every 6 (six) hours as  needed. Patient not taking: Reported on 04/07/2016 09/20/14   Osvaldo Shipper, MD  traZODone (DESYREL) 50 MG tablet Take 1 tablet (50 mg total) by mouth at bedtime as needed for sleep. Patient not taking: Reported on 04/07/2016 09/20/14   Osvaldo Shipper, MD    Family History Family History  Problem Relation Age of Onset  . Family history unknown: Yes    Social History Social History  Substance Use Topics  . Smoking status: Never Smoker  . Smokeless tobacco: Never Used  . Alcohol use Yes     Comment: red wine with evening meal     Allergies   Ciprofloxacin; Lorazepam; Teflaro [ceftaroline]; Gabapentin; Metoclopramide; Penicillins; and Tape   Review of Systems Review of Systems  All other systems reviewed and are negative.    Physical Exam Updated Vital Signs BP 144/64   Pulse 78   Temp 98.2 F (  36.8 C)   Resp 16   SpO2 100%   Physical Exam  Constitutional: She is oriented to person, place, and time. She appears well-developed and well-nourished.  HENT:  Head: Normocephalic and atraumatic.  Eyes: Conjunctivae are normal. Pupils are equal, round, and reactive to light. Right eye exhibits no discharge. Left eye exhibits no discharge. No scleral icterus.  Neck: Normal range of motion. No JVD present. No tracheal deviation present.  Pulmonary/Chest: Effort normal. No stridor.  Musculoskeletal:  Very minor tenderness to palpation of right lateral hip, full range of motion without significant pain. Patient has swelling to her right knee with superficial abrasion to the tibia. Difficult range of motion due to pain, no significant warmth to touch. Distal sensation intact.  Neurological: She is alert and oriented to person, place, and time. Coordination normal.  Psychiatric: She has a normal mood and affect. Her behavior is normal. Judgment and thought content normal.  Nursing note and vitals reviewed.    ED Treatments / Results  Labs (all labs ordered are listed, but only  abnormal results are displayed) Labs Reviewed - No data to display  EKG  EKG Interpretation None       Radiology Dg Knee Complete 4 Views Right  Result Date: 04/07/2016 CLINICAL DATA:  Fall several days ago with persistent right knee pain, initial encounter EXAM: RIGHT KNEE - COMPLETE 4+ VIEW COMPARISON:  None. FINDINGS: Partial knee replacement is noted medially. Severe degenerative changes are noted in the lateral joint space and patellofemoral joint space. A large joint effusion is seen. No definitive acute fracture is noted. Diffuse vascular calcifications are seen. IMPRESSION: Large joint effusion without acute bony abnormality. Postsurgical changes medially with severe degenerative changes in the lateral and patellofemoral compartments. Electronically Signed   By: Alcide Clever M.D.   On: 04/07/2016 07:57   Dg Hip Unilat W Or Wo Pelvis 2-3 Views Right  Result Date: 04/07/2016 CLINICAL DATA:  Fall from a wheelchair several days ago. Increased pain in the low back, hips, and right knee. Initial encounter. EXAM: DG HIP (WITH OR WITHOUT PELVIS) 2-3V RIGHT COMPARISON:  CT abdomen and pelvis 08/26/2014. Abdominal radiographs 01/28/2010. FINDINGS: The right hip is located. No acute fracture is identified. 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 Chronic left superior and inferior pubic rami fractures are partially obscured by overlying bowel gas and skin folds, with assessment also limited by osteopenia. Vascular calcifications and pelvic phleboliths are noted. IMPRESSION: No acute osseous abnormality identified. Electronically Signed   By: Sebastian Ache M.D.   On: 04/07/2016 07:58    Procedures Procedures (including critical care time)  Medications Ordered in ED Medications  traMADol (ULTRAM) tablet 50 mg (50 mg Oral Given 04/07/16 1043)     Initial Impression / Assessment and Plan / ED Course  I have reviewed the triage vital signs and the nursing notes.  Pertinent labs & imaging results that  were available during my care of the patient were reviewed by me and considered in my medical decision making (see chart for details).  Clinical Course     Final Clinical Impressions(s) / ED Diagnoses   Final diagnoses:  Knee sprain, right, initial encounter    Labs:  Imaging:DG hip unilateral, DG knee complete- large right knee joint effusion  Consults:  Therapeutics:  Discharge Meds:   Assessment/Plan: Patient's presentation is most consistent with a knee sprain. Uncertain extent of damage to the knee, patient is wheelchair bound, able to continue transition with minimal difficulty. No signs of  infectious etiology on today's exam. Patient will need follow-up with orthopedics for further evaluation and management. Patient's son was present at bedside, reports they will follow up as directed. Patient to continue using tramadol as previously prescribed for pain, follow-up in the emergency room if any new or worsening signs or symptoms present. Patient verbalized understanding and agreement to today's plan.      New Prescriptions Discharge Medication List as of 04/07/2016  9:28 AM       Eyvonne Mechanic, PA-C 04/07/16 1635    Dione Booze, MD 04/08/16 [phone]

## 2016-04-07 NOTE — ED Triage Notes (Signed)
Patient BIB [facility] from [facility]. C/O increasing pain in rt knee and hip following a fall some time last week. Exact day of fall unknown. Swelling and limited ROM noted to rt knee. Splinted by EMS

## 2016-04-07 NOTE — ED Notes (Signed)
Bed: UQ33 Expected date:  Expected time:  Means of arrival:  Comments: EMS fall 2 days ago-from SNF

## 2018-02-17 ENCOUNTER — Emergency Department (HOSPITAL_COMMUNITY): Payer: Medicare Other

## 2018-02-17 ENCOUNTER — Emergency Department (HOSPITAL_COMMUNITY)
Admission: EM | Admit: 2018-02-17 | Discharge: 2018-02-17 | Disposition: A | Discharge status: Home / Self Care | Payer: Medicare Other | Attending: Emergency Medicine | Admitting: Emergency Medicine

## 2018-02-17 ENCOUNTER — Encounter (HOSPITAL_COMMUNITY): Payer: Self-pay

## 2018-02-17 DIAGNOSIS — Y999 Unspecified external cause status: Secondary | ICD-10-CM | POA: Diagnosis not present

## 2018-02-17 DIAGNOSIS — Y92129 Unspecified place in nursing home as the place of occurrence of the external cause: Secondary | ICD-10-CM | POA: Insufficient documentation

## 2018-02-17 DIAGNOSIS — E039 Hypothyroidism, unspecified: Secondary | ICD-10-CM | POA: Diagnosis not present

## 2018-02-17 DIAGNOSIS — Y939 Activity, unspecified: Secondary | ICD-10-CM | POA: Diagnosis not present

## 2018-02-17 DIAGNOSIS — W19XXXA Unspecified fall, initial encounter: Secondary | ICD-10-CM | POA: Insufficient documentation

## 2018-02-17 DIAGNOSIS — Z794 Long term (current) use of insulin: Secondary | ICD-10-CM | POA: Insufficient documentation

## 2018-02-17 DIAGNOSIS — Z79899 Other long term (current) drug therapy: Secondary | ICD-10-CM | POA: Insufficient documentation

## 2018-02-17 DIAGNOSIS — I10 Essential (primary) hypertension: Secondary | ICD-10-CM | POA: Diagnosis not present

## 2018-02-17 DIAGNOSIS — E119 Type 2 diabetes mellitus without complications: Secondary | ICD-10-CM | POA: Insufficient documentation

## 2018-02-17 DIAGNOSIS — S72401A Unspecified fracture of lower end of right femur, initial encounter for closed fracture: Secondary | ICD-10-CM | POA: Diagnosis not present

## 2018-02-17 DIAGNOSIS — S79911A Unspecified injury of right hip, initial encounter: Secondary | ICD-10-CM | POA: Diagnosis present

## 2018-02-17 MED ORDER — OXYCODONE-ACETAMINOPHEN 5-325 MG PO TABS: 1.0000 | ORAL_TABLET | ORAL | 0 refills | Status: AC | PRN

## 2018-02-17 MED ORDER — ONDANSETRON 8 MG PO TBDP
8.0000 mg | ORAL_TABLET | Freq: Once | ORAL | Status: AC
  Administered 2018-02-17: 8 mg via ORAL
  Filled 2018-02-17: qty 1

## 2018-02-17 MED ORDER — MORPHINE SULFATE (PF) 4 MG/ML IV SOLN
4.0000 mg | Freq: Once | INTRAVENOUS | Status: AC
  Administered 2018-02-17: 4 mg via INTRAVENOUS
  Filled 2018-02-17: qty 1

## 2018-02-17 NOTE — ED Triage Notes (Addendum)
[facility] to a [facility] in the restroom. States she fell on her bottom but complaining of right knee pain. Hx of partial knee replacement. Per facility states her knee is always swollen, ems states it is swollen and painful to touch/move.

## 2018-02-17 NOTE — ED Provider Notes (Signed)
[facility]-EMERGENCY DEPT Provider Note   CSN: [id number] Arrival date & time: 02/17/18  0226     History   Chief Complaint Chief Complaint  Patient presents with  . Fall    HPI Kaitlyn Burns is a 84 y.o. female.  The history is provided by the patient.  She has history of diabetes, hypothyroidism, hypertension, myocardial infarction and comes in after having had a fall at the assisted living facility where she resides.  She injured her right knee in the fall.  She denies other injury.  She rates pain at 10/10.  She denies loss of consciousness or head injury.  Past Medical History:  Diagnosis Date  . Arthritis   . Complication of anesthesia    trouble intubating surgery  . Diabetes mellitus (HCC)   . Difficult intubation   . Thyroid disease   . UTI (lower urinary tract infection)     Patient Active Problem List   Diagnosis Date Noted  . UTI (urinary tract infection) 04/20/2015  . Hypothyroidism 04/18/2015  . Diabetes mellitus type 2, controlled (HCC) 04/18/2015  . Arterial hypotension   . Hypotension 04/17/2015  . Elevated troponin I level 09/12/2014  . Parotitis 09/11/2014  . Sepsis (HCC) 09/11/2014  . Alcohol dependence with withdrawal with complication (HCC) 09/03/2014  . Opiate dependence (HCC) 09/03/2014  . Acute encephalopathy 08/27/2014  . NSTEMI (non-ST elevated myocardial infarction) (HCC) 08/27/2014  . Diabetes mellitus (HCC) 08/27/2014  . Gout 09/12/2013  . Hypertension 09/12/2013  . Diabetes (HCC) 09/12/2013  . Depression 09/12/2013  . GERD (gastroesophageal reflux disease) 09/12/2013    Past Surgical History:  Procedure Laterality Date  . ABDOMINAL HYSTERECTOMY    . ACHILLES TENDON SURGERY Left   . COLONOSCOPY N/A 08/29/2014   Procedure: COLONOSCOPY;  Surgeon: Louis Meckel, MD;  Location: [facility];  Service: Endoscopy;  Laterality: N/A;  . ESOPHAGOGASTRODUODENOSCOPY (EGD) WITH PROPOFOL N/A 08/28/2014   Procedure:  ESOPHAGOGASTRODUODENOSCOPY (EGD) WITH PROPOFOL;  Surgeon: Louis Meckel, MD;  Location: [facility];  Service: Endoscopy;  Laterality: N/A;  . FOOT SURGERY Left    left heel infection that went to bone per spouse  . HEMORROIDECTOMY    . lower abdominel surgery    . MEDIAL PARTIAL KNEE REPLACEMENT    . SHOULDER SURGERY Right    "had shredded a muscle"  . THYROIDECTOMY, PARTIAL       OB History   None      Home Medications    Prior to Admission medications   Medication Sig Start Date End Date Taking? Authorizing Provider  acetaminophen (TYLENOL) 500 MG tablet Take 500 mg by mouth 2 (two) times daily.    [provider]  amitriptyline (ELAVIL) 10 MG tablet Take 10 mg by mouth at bedtime.    [provider]  capsaicin (ZOSTRIX) 0.025 % cream Apply 1 application topically at bedtime. Applied to bottoms of feet for pain.    [provider]  cholestyramine (QUESTRAN) 4 g packet Take 4 g by mouth 2 (two) times daily.    [provider]  diltiazem (DILACOR XR) 120 MG 24 hr capsule Take 1 capsule (120 mg total) by mouth daily. 09/20/14   Osvaldo Shipper, MD  diphenhydrAMINE-zinc acetate (BENADRYL) cream Apply 1 application topically at bedtime. Applied to bilateral legs, abdomin, & back prior to bedtime for itching.    [provider]  divalproex (DEPAKOTE) 250 MG DR tablet Take 250 mg by mouth 2 (two) times daily. 03/18/16  [provider]  DULoxetine (CYMBALTA) 20 MG capsule Take 20 mg by mouth every Monday, Wednesday, and Friday.    [provider]  febuxostat (ULORIC) 40 MG tablet Take 40 mg by mouth daily.    [provider]  insulin aspart (NOVOLOG) 100 UNIT/ML injection Inject 1-9 Units into the skin 3 (three) times daily before meals. Sliding scale: 121-150= 1 unit; 151-200= 2 units; 201-250= 3 units; 251-300= 5 units; 301-350= 7 units; 351-400= 9 units    [provider]  latanoprost (XALATAN) 0.005 %  ophthalmic solution Place 1 drop into both eyes at bedtime.    [provider]  levothyroxine (SYNTHROID, LEVOTHROID) 75 MCG tablet Take 75 mcg by mouth daily before breakfast.    [provider]  Liniments (SALONPAS PAIN RELIEF PATCH) PADS Apply 1 each topically at bedtime. Applied to lower back at bedtime & remove in the morning    [provider]  loperamide (IMODIUM) 2 MG capsule Take 2 mg by mouth every Monday, Wednesday, and Friday.    [provider]  meloxicam (MOBIC) 7.5 MG tablet Take 7.5 mg by mouth daily.    [provider]  metoprolol tartrate (LOPRESSOR) 25 MG tablet Take 0.5 tablets (12.5 mg total) by mouth 2 (two) times daily. Patient not taking: Reported on 04/07/2016 09/20/14   Osvaldo Shipper, MD  Multiple Vitamin (MULTIVITAMIN WITH MINERALS) TABS tablet Take 1 tablet by mouth daily.    [provider]  polycarbophil (FIBERCON) 625 MG tablet Take 625 mg by mouth daily.    [provider]  traMADol (ULTRAM) 50 MG tablet Take 1 tablet (50 mg total) by mouth every 6 (six) hours as needed. Patient not taking: Reported on 04/07/2016 09/20/14   Osvaldo Shipper, MD  traZODone (DESYREL) 50 MG tablet Take 1 tablet (50 mg total) by mouth at bedtime as needed for sleep. Patient not taking: Reported on 04/07/2016 09/20/14   Osvaldo Shipper, MD    Family History Family History  Family history unknown: Yes    Social History Social History   Tobacco Use  . Smoking status: Never Smoker  . Smokeless tobacco: Never Used  Substance Use Topics  . Alcohol use: Yes    Comment: red wine with evening meal  . Drug use: No     Allergies   Ciprofloxacin; Lorazepam; Teflaro [ceftaroline]; Gabapentin; Metoclopramide; Penicillins; and Tape   Review of Systems Review of Systems  All other systems reviewed and are negative.    Physical Exam Updated Vital Signs BP (!) 141/96 (BP Location: Left Arm)   Pulse (!) 55   Temp 98.1 F  (36.7 C) (Oral)   Resp 18   SpO2 98%   Physical Exam  Nursing note and vitals reviewed.  84 year old female, resting comfortably and in no acute distress. Vital signs are significant for mildly elevated blood pressure, mild bradycardia. Oxygen saturation is 98%, which is normal. Head is normocephalic and atraumatic. PERRLA, EOMI. Oropharynx is clear. Neck is nontender and supple without adenopathy or JVD. Back is nontender and there is no CVA tenderness. Lungs are clear without rales, wheezes, or rhonchi. Chest is nontender. Heart has regular rate and rhythm without murmur. Abdomen is soft, flat, nontender without masses or hepatosplenomegaly and peristalsis is normoactive. Extremities: Moderate swelling right knee with tenderness over the right knee and distal half of the right femur.  Marked pain and any passive range of motion of the knee.  He is unable to move her knee voluntarily.  Skin is warm and dry without rash. Neurologic: Mental status is normal, cranial nerves are intact, there are no motor or sensory deficits.  ED Treatments / Results   Radiology Dg Knee Complete 4 Views Right  Result Date: 02/17/2018 CLINICAL DATA:  Fall with leg deformity. EXAM: RIGHT KNEE - COMPLETE 4+ VIEW COMPARISON:  Right knee radiograph 04/07/2016 FINDINGS: There is a right knee medial compartment hemiarthroplasty, which is now in abnormal alignment with lateral subluxation. There is a comminuted fracture of the distal femur with mild medial and posterior displacement. Extensive arterial calcification. IMPRESSION: Comminuted fracture of the distal right femur with medial displacement. Abnormal alignment of medial compartment hemiarthroplasty. Electronically Signed   By: Deatra RobinsonKevin  Herman M.D.   On: 02/17/2018 04:31   Dg Femur Min 2 Views Right  Result Date: 02/17/2018 CLINICAL DATA:  Fall with lower extremity deformity. EXAM: RIGHT FEMUR 2 VIEWS COMPARISON:  None. FINDINGS: Comminuted fracture of the  distal right femur with lateral subluxation of the medial compartment femorotibial hemiarthroplasty. There is no proximal femur fracture. Right hip is approximated. IMPRESSION: Comminuted distal right femur fracture with lateral subluxation of the medial compartment hemiarthroplasty. No proximal femur fracture or hip dislocation. Electronically Signed   By: Deatra RobinsonKevin  Herman M.D.   On: 02/17/2018 04:34    Procedures .Splint Application Date/Time: 02/17/2018 6:00 AM Performed by: Dione BoozeGlick, Hansen Carino, MD Authorized by: Dione BoozeGlick, Calton Harshfield, MD   Consent:    Consent obtained:  Verbal   Consent given by:  Patient   Risks discussed:  Discoloration, numbness and pain   Alternatives discussed:  Alternative treatment Pre-procedure details:    Sensation:  Normal   Skin color:  Normal Procedure details:    Laterality:  Right   Location:  Leg   Strapping: no     Splint type:  Long leg Post-procedure details:    Pain:  Improved   Sensation:  Normal   Skin color:  Normal   Patient tolerance of procedure:  Tolerated well, no immediate complications Comments:     Splint applied by orthopedic technician, neurovascular exam chec     Medications Ordered in ED Medications  morphine 4 MG/ML injection 4 mg (4 mg Intravenous Given 02/17/18 0507)  ondansetron (ZOFRAN-ODT) disintegrating tablet 8 mg (8 mg Oral Given 02/17/18 0518)     Initial Impression / Assessment and Plan / ED Course  I have reviewed the triage vital signs and the nursing notes.  Pertinent labs & imaging results that were available during my care of the patient were reviewed by me and considered in my medical decision making (see chart for details).  Fall with injury to the right knee and upper leg.  X-rays are ordered.  Old records are reviewed, and she has a previous ED visit for a knee sprain 2 years ago.  X-rays show a transverse fracture of the supracondylar region of the femur.  This is grossly unstable.  Case is discussed with Dr. Ophelia CharterYates,  on-call for orthopedics.  Patient is nonweightbearing, and only bears weight to transfer from bed to wheelchair.  Therefore, he does not feel operative repair is indicated.  He requests that the leg be splinted with the knee flexed at about 60 degrees.  This is done with the aid of orthopedic technician.  She is discharged with prescription for oxycodone-acetaminophen.  This is done cautiously cut, since patient does have history of drug abuse.  However, she has clear need for pain control.  She is to follow-up with Dr. Ophelia CharterYates in 4  days.  At that point, he can consider transition to a cast.  Final Clinical Impressions(s) / ED Diagnoses   Final diagnoses:  Fall at nursing home, initial encounter  Closed fracture of distal end of right femur, unspecified fracture morphology, initial encounter [facility])    ED Discharge Orders        Ordered    oxyCODONE-acetaminophen (PERCOCET) 5-325 MG tablet  Every 4 hours PRN     02/17/18 0621       Dione Booze, MD 02/17/18 [phone]

## 2018-02-17 NOTE — ED Notes (Signed)
[facility] called for transport.  

## 2018-02-17 NOTE — Discharge Instructions (Addendum)
No weight bearing on right leg.  Apply ice, keep leg elevated.

## 2018-02-17 NOTE — ED Notes (Signed)
[facility] level of care. Facility aware of condition.

## 2018-02-17 NOTE — Progress Notes (Signed)
Orthopedic Tech Progress Note Patient Details:  Kaitlyn Burns 05/30/1925 [id number]  Ortho Devices Type of Ortho Device: Post (long leg) splint Ortho Device/Splint Location: rle. appliecd at 60 degree bend. Ortho Device/Splint Interventions: Ordered, Adjustment   Post Interventions Patient Tolerated: Well Instructions Provided: Care of device, Adjustment of device   [name], [name] 02/17/2018, 5:57 AM

## 2018-03-07 DEATH — deceased
